# Patient Record
Sex: Female | Born: 1995 | Race: Black or African American | Hispanic: No | Marital: Single | Smoking: Never smoker
Health system: Southern US, Community
[De-identification: ages and names within clinical notes are randomized; demographics above are authoritative.]

## PROBLEM LIST (undated history)

## (undated) ENCOUNTER — Inpatient Hospital Stay (HOSPITAL_COMMUNITY): Payer: Self-pay

## (undated) ENCOUNTER — Emergency Department (HOSPITAL_COMMUNITY): Payer: Medicaid Other

## (undated) DIAGNOSIS — F32A Depression, unspecified: Secondary | ICD-10-CM

## (undated) DIAGNOSIS — F329 Major depressive disorder, single episode, unspecified: Secondary | ICD-10-CM

## (undated) DIAGNOSIS — D649 Anemia, unspecified: Secondary | ICD-10-CM

## (undated) DIAGNOSIS — A599 Trichomoniasis, unspecified: Secondary | ICD-10-CM

## (undated) DIAGNOSIS — J40 Bronchitis, not specified as acute or chronic: Secondary | ICD-10-CM

## (undated) DIAGNOSIS — N926 Irregular menstruation, unspecified: Secondary | ICD-10-CM

## (undated) DIAGNOSIS — J209 Acute bronchitis, unspecified: Secondary | ICD-10-CM

## (undated) DIAGNOSIS — T8859XA Other complications of anesthesia, initial encounter: Secondary | ICD-10-CM

## (undated) HISTORY — PX: WISDOM TOOTH EXTRACTION: SHX21

## (undated) HISTORY — DX: Other complications of anesthesia, initial encounter: T88.59XA

## (undated) HISTORY — DX: Acute bronchitis, unspecified: J20.9

---
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
## 2013-01-01 ENCOUNTER — Other Ambulatory Visit (HOSPITAL_COMMUNITY)
Admission: RE | Admit: 2013-01-01 | Discharge: 2013-01-01 | Disposition: A | Discharge status: Home / Self Care | Payer: Medicaid Other | Source: Ambulatory Visit | Attending: Obstetrics & Gynecology | Admitting: Obstetrics & Gynecology

## 2013-01-01 ENCOUNTER — Other Ambulatory Visit: Payer: Self-pay | Admitting: Obstetrics & Gynecology

## 2013-01-01 ENCOUNTER — Ambulatory Visit (INDEPENDENT_AMBULATORY_CARE_PROVIDER_SITE_OTHER): Payer: Medicaid Other | Admitting: Obstetrics & Gynecology

## 2013-01-01 ENCOUNTER — Encounter: Payer: Self-pay | Admitting: Obstetrics & Gynecology

## 2013-01-01 VITALS — BP 127/78 | Temp 96.6°F | Wt 143.4 lb

## 2013-01-01 DIAGNOSIS — Z113 Encounter for screening for infections with a predominantly sexual mode of transmission: Secondary | ICD-10-CM | POA: Insufficient documentation

## 2013-01-01 DIAGNOSIS — Z3682 Encounter for antenatal screening for nuchal translucency: Secondary | ICD-10-CM

## 2013-01-01 DIAGNOSIS — Z23 Encounter for immunization: Secondary | ICD-10-CM

## 2013-01-01 DIAGNOSIS — Z3400 Encounter for supervision of normal first pregnancy, unspecified trimester: Secondary | ICD-10-CM

## 2013-01-01 LAB — POCT URINALYSIS DIP (DEVICE)
Bilirubin Urine: NEGATIVE
Glucose, UA: NEGATIVE mg/dL
Leukocytes, UA: NEGATIVE
Nitrite: NEGATIVE
pH: 6.5 (ref 5.0–8.0)

## 2013-01-01 LAB — OB RESULTS CONSOLE GBS: GBS: POSITIVE

## 2013-01-01 MED ORDER — FERROUS SULFATE 325 (65 FE) MG PO TABS: 325.0000 mg | ORAL_TABLET | Freq: Every day | ORAL | Status: DC

## 2013-01-01 MED ORDER — PNV-IRON 29-1.13-0.4 MG PO TABS: 1.0000 | ORAL_TABLET | Freq: Every day | ORAL | Status: DC

## 2013-01-01 MED ORDER — INFLUENZA VIRUS VACC SPLIT PF IM SUSP
0.5000 mL | INTRAMUSCULAR | Status: AC
  Administered 2013-01-01: 0.5 mL via INTRAMUSCULAR

## 2013-01-01 NOTE — Progress Notes (Signed)
   Subjective:referred from ED for [facility]    Michelle Greer is a G1P0000 [redacted]w[redacted]d being seen today for her first obstetrical visit. Patient does intend to breast feed. Pregnancy history fully reviewed.  Patient reports no complaints.  Filed Vitals:   01/01/13 1011  BP: 127/78  Temp: 96.6 F (35.9 C)  Weight: 143 lb 6.4 oz (65.046 kg)    HISTORY: OB History   Grav Para Term Preterm Abortions TAB SAB Ect Mult Living   2 0 0 0 0 0 0 0 0 0      # Outc Date GA Lbr Len/2nd Wgt Sex Del Anes PTL Lv   1 CUR            2 GRA              Past Medical History  Diagnosis Date  . Bronchitis   . Bronchitis, acute    History reviewed. No pertinent past surgical history. Family History  Problem Relation Age of Onset  . Asthma Mother      Exam    Uterus:     Pelvic Exam:    Perineum: No Hemorrhoids   Vulva: normal   Vagina:  normal mucosa   pH:    Cervix: no lesions   Adnexa: normal adnexa   Bony Pelvis: average  System: Breast:  normal appearance, no masses or tenderness   Skin: normal coloration and turgor, no rashes    Neurologic: oriented, normal, normal mood   Extremities: normal strength, tone, and muscle mass   HEENT neck supple with midline trachea   Mouth/Teeth mucous membranes moist, pharynx normal without lesions and dental hygiene good   Neck supple   Cardiovascular: regular rate and rhythm   Respiratory:  appears well, vitals normal, no respiratory distress, acyanotic, normal RR, ear and throat exam is normal, neck free of mass or lymphadenopathy, chest clear, no wheezing, crepitations, rhonchi, normal symmetric air entry   Abdomen: soft, non-tender; bowel sounds normal; no masses,  no organomegaly   Urinary: urethral meatus normal      Assessment:    Pregnancy: G2P0000 Patient Active Problem List  Diagnosis  . Supervision of normal first pregnancy        Plan:     Initial labs drawn. Prenatal vitamins. Problem list reviewed and updated. Genetic  Screening discussed First Screen: requested.  Ultrasound discussed; fetal survey: requested.  Follow up in 4 weeks. 50% of 30 min visit spent on counseling and coordination of care.  Anemia-FeSO4 Rx   Michelle Greer 01/01/2013

## 2013-01-01 NOTE — Progress Notes (Signed)
Pulse- 121 Patient reports some lower abdominal/pelvic pain

## 2013-01-01 NOTE — Progress Notes (Signed)
Nutrition note: 1st visit consult Pt has gained 7.4# @ [redacted]w[redacted]d, which is > expected. Pt reports eating 4x/d.  Pt reports nausea but meds are helping. Pt is taking PNV. Pt received verbal & written education on general nutrition during pregnancy. Disc tips to decrease nausea. Disc wt gain goals of 25-35# or 1#/wk. Pt agrees to continue taking PNV. Pt does not have WIC but plans to apply. Pt plans to BF. F/u if referred Blondell Reveal, MS, RD, LDN

## 2013-01-01 NOTE — Progress Notes (Signed)
Nuchal Translucency scheduled with MFM on 01/26/13 at 930 am.

## 2013-01-02 LAB — OBSTETRIC PANEL
Basophils Absolute: 0 10*3/uL (ref 0.0–0.1)
Basophils Relative: 0 % (ref 0–1)
Hemoglobin: 10.3 g/dL — ABNORMAL LOW (ref 12.0–15.0)
Hepatitis B Surface Ag: NEGATIVE
MCHC: 30.7 g/dL (ref 30.0–36.0)
Monocytes Relative: 10 % (ref 3–12)
Neutro Abs: 6.6 10*3/uL (ref 1.7–7.7)
Neutrophils Relative %: 75 % (ref 43–77)
Rh Type: POSITIVE

## 2013-01-03 LAB — HEMOGLOBINOPATHY EVALUATION
Hemoglobin Other: 0 %
Hgb A2 Quant: 2.3 % (ref 2.2–3.2)
Hgb F Quant: 0 % (ref 0.0–2.0)
Hgb S Quant: 0 %

## 2013-01-04 LAB — CULTURE, OB URINE

## 2013-01-09 ENCOUNTER — Encounter: Payer: Self-pay | Admitting: *Deleted

## 2013-01-15 ENCOUNTER — Encounter (HOSPITAL_COMMUNITY): Payer: Self-pay | Admitting: Obstetrics & Gynecology

## 2013-01-17 ENCOUNTER — Emergency Department (HOSPITAL_COMMUNITY)
Admission: EM | Admit: 2013-01-17 | Discharge: 2013-01-18 | Disposition: A | Discharge status: Home / Self Care | Payer: Medicaid Other | Attending: Emergency Medicine | Admitting: Emergency Medicine

## 2013-01-17 ENCOUNTER — Encounter (HOSPITAL_COMMUNITY): Payer: Self-pay | Admitting: Emergency Medicine

## 2013-01-17 DIAGNOSIS — Z79899 Other long term (current) drug therapy: Secondary | ICD-10-CM | POA: Insufficient documentation

## 2013-01-17 DIAGNOSIS — O9989 Other specified diseases and conditions complicating pregnancy, childbirth and the puerperium: Secondary | ICD-10-CM | POA: Insufficient documentation

## 2013-01-17 DIAGNOSIS — N898 Other specified noninflammatory disorders of vagina: Secondary | ICD-10-CM | POA: Insufficient documentation

## 2013-01-17 DIAGNOSIS — R300 Dysuria: Secondary | ICD-10-CM | POA: Insufficient documentation

## 2013-01-17 DIAGNOSIS — Z8709 Personal history of other diseases of the respiratory system: Secondary | ICD-10-CM | POA: Insufficient documentation

## 2013-01-17 DIAGNOSIS — Z3490 Encounter for supervision of normal pregnancy, unspecified, unspecified trimester: Secondary | ICD-10-CM

## 2013-01-17 DIAGNOSIS — Z87891 Personal history of nicotine dependence: Secondary | ICD-10-CM | POA: Insufficient documentation

## 2013-01-17 NOTE — ED Notes (Signed)
Pt states she is having burning with urination  Pt states she is having lower abd pain  Pt states she has vaginal discharge that is white in color and when she washes her vaginal area it burns

## 2013-01-18 LAB — URINALYSIS, ROUTINE W REFLEX MICROSCOPIC
Bilirubin Urine: NEGATIVE
Glucose, UA: NEGATIVE mg/dL
Hgb urine dipstick: NEGATIVE
Ketones, ur: NEGATIVE mg/dL
pH: 7 (ref 5.0–8.0)

## 2013-01-18 LAB — WET PREP, GENITAL
Trich, Wet Prep: NONE SEEN
WBC, Wet Prep HPF POC: NONE SEEN
Yeast Wet Prep HPF POC: NONE SEEN

## 2013-01-18 LAB — URINE MICROSCOPIC-ADD ON

## 2013-01-18 MED ORDER — PHENAZOPYRIDINE HCL 200 MG PO TABS: 200.0000 mg | ORAL_TABLET | Freq: Three times a day (TID) | ORAL | Status: DC

## 2013-01-18 NOTE — ED Provider Notes (Signed)
History     CSN: [id number]  Arrival date & time 01/17/13  2336   First MD Initiated Contact with Patient 01/17/13 2354      Chief Complaint  Patient presents with  . Dysuria    (Consider location/radiation/quality/duration/timing/severity/associated sxs/prior treatment) HPI  Resistance to the emergency department with complaints of burning on urination and vaginal discharge. She denies having any abdominal pain or suprapubic pain. No back pain. She says that she notices a smell of her urine and that her vaginal discharge is white and green. She has been sexually active but says she uses protection. The symptoms have been persisting for 5 days. Tonight she decided that she was worried about and wanted it to get it checked out so she came to the emergency department. She has been taking her prenatal vitamins and iron supplements as prescribed. Says she is going to be out soon he needs to go get them refilled. She has an ultrasound scheduled for March 7 with the gynecologist.  Past Medical History  Diagnosis Date  . Bronchitis   . Bronchitis, acute     History reviewed. No pertinent past surgical history.  Family History  Problem Relation Age of Onset  . Asthma Mother     History  Substance Use Topics  . Smoking status: Former Games developer  . Smokeless tobacco: Never Used  . Alcohol Use: No    OB History   Grav Para Term Preterm Abortions TAB SAB Ect Mult Living   1 0 0 0 0 0 0 0 0 0       Review of Systems  Review of Systems  Gen: no weight loss, fevers, chills, night sweats  Eyes: no discharge or drainage, no occular pain or visual changes  Nose: no epistaxis or rhinorrhea  Mouth: no dental pain, no sore throat  Neck: no neck pain  Lungs:No wheezing, coughing or hemoptysis CV: no chest pain, palpitations, dependent edema or orthopnea  Abd: no abdominal pain, nausea, vomiting  GU: + dysuria and vaginal discharge. No gross hematuria  MSK:  No abnormalities  Neuro: no  headache, no focal neurologic deficits  Skin: no abnormalities Psyche: negative.   Allergies  Tomato  Home Medications   Current Outpatient Rx  Name  Route  Sig  Dispense  Refill  . Prenatal Vit-Fe Fumarate-FA (PRENATAL MULTIVITAMIN) TABS   Oral   Take 1 tablet by mouth every morning.         . ferrous sulfate 325 (65 FE) MG tablet   Oral   Take 1 tablet (325 mg total) by mouth daily with breakfast.   30 tablet   8   . phenazopyridine (PYRIDIUM) 200 MG tablet   Oral   Take 1 tablet (200 mg total) by mouth 3 (three) times daily.   6 tablet   0     BP 130/69  Pulse 97  Temp(Src) 98.6 F (37 C) (Oral)  Resp 17  Ht 5\' 5"  (1.651 m)  Wt 144 lb 12.8 oz (65.681 kg)  BMI 24.1 kg/m2  SpO2 100%  LMP 10/27/2012  Physical Exam  Nursing note and vitals reviewed. Constitutional: She appears well-developed and well-nourished. No distress.  HENT:  Head: Normocephalic and atraumatic.  Eyes: Pupils are equal, round, and reactive to light.  Neck: Normal range of motion. Neck supple.  Cardiovascular: Normal rate and regular rhythm.   Pulmonary/Chest: Effort normal.  Abdominal: Soft.  Genitourinary: Uterus is enlarged. Uterus is not tender. Cervix exhibits discharge. Cervix exhibits no motion  tenderness and no friability. No tenderness or bleeding around the vagina. No foreign body around the vagina. Vaginal discharge found.  Neurological: She is alert.  Skin: Skin is warm and dry.    ED Course  Procedures (including critical care time)  Labs Reviewed  URINALYSIS, ROUTINE W REFLEX MICROSCOPIC - Abnormal; Notable for the following:    Leukocytes, UA TRACE (*)    All other components within normal limits  WET PREP, GENITAL  GC/CHLAMYDIA PROBE AMP  URINE CULTURE  URINE MICROSCOPIC-ADD ON   No results found.   1. Dysuria   2. Pregnant       MDM  Wet prep and urine unremarkable. NO explanation for patients symptoms. She has an appointment with her ob/gyn on March  7th. Will have them reassess her dysuria at that point.   Rx; Pyridium (cat B in pregnancy)  Pt has been advised of the symptoms that warrant their return to the ED. Patient has voiced understanding and has agreed to follow-up with the PCP or specialist.         Dorthula Matas, PA 01/18/13 [phone]

## 2013-01-18 NOTE — ED Provider Notes (Signed)
Medical screening examination/treatment/procedure(s) were performed by non-physician practitioner and as supervising physician I was immediately available for consultation/collaboration.  Aleka Twitty M Karlin Binion, MD 01/18/13 0456 

## 2013-01-19 LAB — URINE CULTURE

## 2013-01-26 ENCOUNTER — Ambulatory Visit (HOSPITAL_COMMUNITY): Payer: Medicaid Other

## 2013-01-31 ENCOUNTER — Ambulatory Visit (INDEPENDENT_AMBULATORY_CARE_PROVIDER_SITE_OTHER): Payer: Medicaid Other | Admitting: Advanced Practice Midwife

## 2013-01-31 VITALS — BP 136/71 | Temp 97.8°F | Wt 142.0 lb

## 2013-01-31 DIAGNOSIS — Z3402 Encounter for supervision of normal first pregnancy, second trimester: Secondary | ICD-10-CM

## 2013-01-31 DIAGNOSIS — Z3400 Encounter for supervision of normal first pregnancy, unspecified trimester: Secondary | ICD-10-CM

## 2013-01-31 LAB — POCT URINALYSIS DIP (DEVICE)
Glucose, UA: NEGATIVE mg/dL
Ketones, ur: NEGATIVE mg/dL
Leukocytes, UA: NEGATIVE
Specific Gravity, Urine: 1.025 (ref 1.005–1.030)
Urobilinogen, UA: 0.2 mg/dL (ref 0.0–1.0)

## 2013-01-31 MED ORDER — INTEGRA F 125-1 MG PO CAPS: 1.0000 | ORAL_CAPSULE | Freq: Every day | ORAL | Status: DC

## 2013-01-31 MED ORDER — AMOXICILLIN 500 MG PO CAPS: 500.0000 mg | ORAL_CAPSULE | Freq: Three times a day (TID) | ORAL | Status: DC

## 2013-01-31 NOTE — Progress Notes (Signed)
Report slight fatigue, hgb 10.3; RX Integra; reviewed lab results, missed nuchal appt, next appt on 02/01/13.  Discussed GBS pos in urine and need for treatment in labor.  RX Amoxicillin.

## 2013-01-31 NOTE — Progress Notes (Signed)
Pulse 106 Edema trace in feet. C/o of pelvic pain. Vaginal d/c stated as white with slight odor.

## 2013-02-01 ENCOUNTER — Ambulatory Visit (HOSPITAL_COMMUNITY): Admission: RE | Admit: 2013-02-01 | Payer: Medicaid Other | Source: Ambulatory Visit

## 2013-02-01 ENCOUNTER — Ambulatory Visit (HOSPITAL_COMMUNITY): Payer: Medicaid Other

## 2013-02-02 ENCOUNTER — Ambulatory Visit (HOSPITAL_COMMUNITY): Payer: Medicaid Other

## 2013-02-02 ENCOUNTER — Ambulatory Visit (HOSPITAL_COMMUNITY)
Admission: RE | Admit: 2013-02-02 | Discharge: 2013-02-02 | Disposition: A | Discharge status: Home / Self Care | Payer: Medicaid Other | Source: Ambulatory Visit | Attending: Obstetrics & Gynecology | Admitting: Obstetrics & Gynecology

## 2013-02-02 ENCOUNTER — Other Ambulatory Visit: Payer: Self-pay | Admitting: Obstetrics & Gynecology

## 2013-02-02 DIAGNOSIS — O3510X0 Maternal care for (suspected) chromosomal abnormality in fetus, unspecified, not applicable or unspecified: Secondary | ICD-10-CM | POA: Insufficient documentation

## 2013-02-02 DIAGNOSIS — Z3682 Encounter for antenatal screening for nuchal translucency: Secondary | ICD-10-CM

## 2013-02-02 DIAGNOSIS — O351XX0 Maternal care for (suspected) chromosomal abnormality in fetus, not applicable or unspecified: Secondary | ICD-10-CM | POA: Insufficient documentation

## 2013-02-02 DIAGNOSIS — Z3689 Encounter for other specified antenatal screening: Secondary | ICD-10-CM | POA: Insufficient documentation

## 2013-02-02 NOTE — Progress Notes (Signed)
Michelle Greer  was seen today for an ultrasound appointment.  See full report in [facility].  Impression: Single IUP at 14 0/7 weeks Limited views of the anatomy obtained due to early gestaional age Unable to perform first trimester screen due gestational age  Recommendations: Recommend quad screen between 15-[redacted] weeks gestation Ultrasound for fetal anatomy at approximately [redacted] weeks gestation  Alpha Gula, MD

## 2013-02-16 ENCOUNTER — Emergency Department (HOSPITAL_COMMUNITY)
Admission: EM | Admit: 2013-02-16 | Discharge: 2013-02-16 | Disposition: A | Discharge status: Home / Self Care | Payer: Medicaid Other | Attending: Emergency Medicine | Admitting: Emergency Medicine

## 2013-02-16 ENCOUNTER — Encounter (HOSPITAL_COMMUNITY): Payer: Self-pay | Admitting: Emergency Medicine

## 2013-02-16 DIAGNOSIS — Z8709 Personal history of other diseases of the respiratory system: Secondary | ICD-10-CM | POA: Insufficient documentation

## 2013-02-16 DIAGNOSIS — R109 Unspecified abdominal pain: Secondary | ICD-10-CM | POA: Insufficient documentation

## 2013-02-16 DIAGNOSIS — Z87891 Personal history of nicotine dependence: Secondary | ICD-10-CM | POA: Insufficient documentation

## 2013-02-16 DIAGNOSIS — O219 Vomiting of pregnancy, unspecified: Secondary | ICD-10-CM | POA: Insufficient documentation

## 2013-02-16 DIAGNOSIS — Z3201 Encounter for pregnancy test, result positive: Secondary | ICD-10-CM | POA: Insufficient documentation

## 2013-02-16 LAB — URINE MICROSCOPIC-ADD ON

## 2013-02-16 LAB — COMPREHENSIVE METABOLIC PANEL
ALT: 7 U/L (ref 0–35)
AST: 12 U/L (ref 0–37)
Albumin: 3 g/dL — ABNORMAL LOW (ref 3.5–5.2)
Alkaline Phosphatase: 45 U/L (ref 39–117)
BUN: 8 mg/dL (ref 6–23)
CO2: 23 mEq/L (ref 19–32)
Calcium: 8.6 mg/dL (ref 8.4–10.5)
Chloride: 100 mEq/L (ref 96–112)
Creatinine, Ser: 0.69 mg/dL (ref 0.50–1.10)
GFR calc Af Amer: >90 mL/min (ref >90)
GFR calc non Af Amer: >90 mL/min (ref >90)
Glucose, Bld: 93 mg/dL (ref 70–99)
Potassium: 3.8 mEq/L (ref 3.5–5.1)
Sodium: 133 mEq/L — ABNORMAL LOW (ref 135–145)
Total Bilirubin: 0.2 mg/dL — ABNORMAL LOW (ref 0.3–1.2)
Total Protein: 7.2 g/dL (ref 6.0–8.3)

## 2013-02-16 LAB — URINALYSIS, ROUTINE W REFLEX MICROSCOPIC
Hgb urine dipstick: NEGATIVE
Nitrite: NEGATIVE
Specific Gravity, Urine: 1.029 (ref 1.005–1.030)
Urobilinogen, UA: 0.2 mg/dL (ref 0.0–1.0)

## 2013-02-16 LAB — CBC WITH DIFFERENTIAL/PLATELET
Basophils Relative: 0 % (ref 0–1)
Eosinophils Absolute: 0 10*3/uL (ref 0.0–0.7)
MCH: 25.8 pg — ABNORMAL LOW (ref 26.0–34.0)
MCHC: 33 g/dL (ref 30.0–36.0)
Monocytes Absolute: 0.2 10*3/uL (ref 0.1–1.0)
Neutrophils Relative %: 92 % — ABNORMAL HIGH (ref 43–77)
Platelets: 347 10*3/uL (ref 150–400)

## 2013-02-16 LAB — PREGNANCY, URINE: Preg Test, Ur: POSITIVE — AB

## 2013-02-16 LAB — LIPASE, BLOOD: Lipase: 33 U/L (ref 11–59)

## 2013-02-16 MED ORDER — SODIUM CHLORIDE 0.9 % IV BOLUS (SEPSIS)
1000.0000 mL | Freq: Once | INTRAVENOUS | Status: AC
Start: 2013-02-16 — End: 2013-02-16
  Administered 2013-02-16: 1000 mL via INTRAVENOUS

## 2013-02-16 MED ORDER — ONDANSETRON HCL 4 MG PO TABS: 4.0000 mg | ORAL_TABLET | Freq: Four times a day (QID) | ORAL | Status: DC

## 2013-02-16 MED ORDER — ONDANSETRON HCL 4 MG/2ML IJ SOLN
4.0000 mg | Freq: Once | INTRAMUSCULAR | Status: AC
  Administered 2013-02-16: 4 mg via INTRAVENOUS
  Filled 2013-02-16: qty 2

## 2013-02-16 NOTE — ED Notes (Addendum)
Patient with nausea, vomiting, abdominal pain especially on left lower side radiating over to the right.  Patient reports symptoms started around 10pm last night and patient reports feeling dizzy today.  Pt reports vomiting x eight.  Pt rates pain as a 8/10.  Patient is sixteen weeks pregnant.

## 2013-02-16 NOTE — ED Provider Notes (Signed)
History     CSN: [id number]  Arrival date & time 02/16/13  1811   First MD Initiated Contact with Patient 02/16/13 1819      Chief Complaint  Patient presents with  . Emesis    (Consider location/radiation/quality/duration/timing/severity/associated sxs/prior treatment) HPI Comments: Comes to the ER for evaluation of nausea and vomiting. Patient reports that symptoms began last night. She has had diffuse abdominal cramping which has been moderate. Chest no fever. Of breath. Symptoms. Patient reports that her mother had a GI illness 2 days ago including diarrhea. Patient has not had any diarrhea today.  Patient is a 17 y.o. female presenting with vomiting.  Emesis Associated symptoms: abdominal pain   Associated symptoms: no diarrhea     Past Medical History  Diagnosis Date  . Bronchitis   . Bronchitis, acute     No past surgical history on file.  Family History  Problem Relation Age of Onset  . Asthma Mother     History  Substance Use Topics  . Smoking status: Former Games developer  . Smokeless tobacco: Never Used  . Alcohol Use: No    OB History   Grav Para Term Preterm Abortions TAB SAB Ect Mult Living   1 0 0 0 0 0 0 0 0 0       Review of Systems  Gastrointestinal: Positive for nausea, vomiting and abdominal pain. Negative for diarrhea.  All other systems reviewed and are negative.    Allergies  Tomato  Home Medications   Current Outpatient Rx  Name  Route  Sig  Dispense  Refill  . amoxicillin (AMOXIL) 500 MG capsule   Oral   Take 1 capsule (500 mg total) by mouth 3 (three) times daily.   21 capsule   0   . Fe Fum-FePoly-FA-Vit C-Vit B3 (INTEGRA F) 125-1 MG CAPS   Oral   Take 1 tablet by mouth daily.   30 capsule   1   . ferrous sulfate 325 (65 FE) MG tablet   Oral   Take 1 tablet (325 mg total) by mouth daily with breakfast.   30 tablet   8   . phenazopyridine (PYRIDIUM) 200 MG tablet   Oral   Take 1 tablet (200 mg total) by mouth 3  (three) times daily.   6 tablet   0   . Prenatal Vit-Fe Fumarate-FA (PRENATAL MULTIVITAMIN) TABS   Oral   Take 1 tablet by mouth every morning.           LMP 10/27/2012  Physical Exam  Constitutional: She is oriented to person, place, and time. She appears well-developed and well-nourished. No distress.  HENT:  Head: Normocephalic and atraumatic.  Right Ear: Hearing normal.  Nose: Nose normal.  Mouth/Throat: Oropharynx is clear and moist and mucous membranes are normal.  Eyes: Conjunctivae and EOM are normal. Pupils are equal, round, and reactive to light.  Neck: Normal range of motion. Neck supple.  Cardiovascular: Normal rate, regular rhythm, S1 normal and S2 normal.  Exam reveals no gallop and no friction rub.   No murmur heard. Pulmonary/Chest: Effort normal and breath sounds normal. No respiratory distress. She exhibits no tenderness.  Abdominal: Soft. Normal appearance and bowel sounds are normal. There is no hepatosplenomegaly. There is no tenderness. There is no rebound, no guarding, no tenderness at McBurney's point and negative Murphy's sign. No hernia.  Musculoskeletal: Normal range of motion.  Neurological: She is alert and oriented to person, place, and time. She has normal strength. No  cranial nerve deficit or sensory deficit. Coordination normal. GCS eye subscore is 4. GCS verbal subscore is 5. GCS motor subscore is 6.  Skin: Skin is warm, dry and intact. No rash noted. No cyanosis.  Psychiatric: She has a normal mood and affect. Her speech is normal and behavior is normal. Thought content normal.    ED Course  Procedures (including critical care time)  Labs Reviewed  CBC WITH DIFFERENTIAL - Abnormal; Notable for the following:    Hemoglobin 11.7 (*)    HCT 35.5 (*)    MCH 25.8 (*)    RDW 24.0 (*)    Neutrophils Relative 92 (*)    Lymphocytes Relative 6 (*)    Monocytes Relative 2 (*)    Lymphs Abs 0.5 (*)    All other components within normal limits   COMPREHENSIVE METABOLIC PANEL - Abnormal; Notable for the following:    Sodium 133 (*)    Albumin 3.0 (*)    Total Bilirubin 0.2 (*)    All other components within normal limits  URINALYSIS, ROUTINE W REFLEX MICROSCOPIC - Abnormal; Notable for the following:    Leukocytes, UA TRACE (*)    All other components within normal limits  PREGNANCY, URINE - Abnormal; Notable for the following:    Preg Test, Ur POSITIVE (*)    All other components within normal limits  URINE MICROSCOPIC-ADD ON - Abnormal; Notable for the following:    Squamous Epithelial / LPF MANY (*)    Bacteria, UA FEW (*)    All other components within normal limits  LIPASE, BLOOD   No results found.   Diagnosis: Vomiting in pregnancy    MDM  Patient comes to the ER with complaints of nausea and vomiting. Patient does have a sick contact at home with GI symptoms. She is in her second trimester of pregnancy. Patient was admitted for IV fluids and Zofran. Her lab work is unremarkable. Patient is not experiencing any vaginal bleeding. She has a confirmed intrauterine pregnancy. Patient will be discharged, continue oral hydration and medications for nausea and vomiting.        Gilda Crease, MD 02/16/13 2026

## 2013-02-28 ENCOUNTER — Ambulatory Visit (INDEPENDENT_AMBULATORY_CARE_PROVIDER_SITE_OTHER): Payer: Medicaid Other | Admitting: Family Medicine

## 2013-02-28 ENCOUNTER — Other Ambulatory Visit: Payer: Self-pay | Admitting: Family Medicine

## 2013-02-28 VITALS — BP 123/81 | Temp 97.2°F | Wt 144.6 lb

## 2013-02-28 DIAGNOSIS — Z3402 Encounter for supervision of normal first pregnancy, second trimester: Secondary | ICD-10-CM

## 2013-02-28 DIAGNOSIS — Z3400 Encounter for supervision of normal first pregnancy, unspecified trimester: Secondary | ICD-10-CM

## 2013-02-28 LAB — POCT URINALYSIS DIP (DEVICE)
Protein, ur: NEGATIVE mg/dL
Specific Gravity, Urine: 1.01 (ref 1.005–1.030)
Urobilinogen, UA: 0.2 mg/dL (ref 0.0–1.0)
pH: 7 (ref 5.0–8.0)

## 2013-02-28 MED ORDER — RANITIDINE HCL 150 MG PO TABS: 75.0000 mg | ORAL_TABLET | Freq: Two times a day (BID) | ORAL | Status: DC

## 2013-02-28 NOTE — Patient Instructions (Addendum)
AFP Maternal This is a routine screen (tests) used to check for fetal abnormalities such as Down syndrome and neural tube defects. Down Syndrome is a chromosomal abnormality, sometimes called Trisomy 26. Neural tube defects are serious birth defects. The brain, spinal cord, or their coverings do not develop completely. Women should be tested in the 15th to 20th week of pregnancy. The msAFP screen involves three or four tests that measure substances found in the blood that make the testing better. During development, AFP levels in fetal blood and amniotic fluid rise until about 12 weeks. The levels then gradually fall until birth. AFP is a protein produce by fetal tissue. AFP crosses the placenta and appears in the maternal blood. A baby with an open neural tube defect has an opening in its spine, head, or abdominal wall that allows higher-than-usual amounts of AFP to pass into the mother's blood. If a screen is positive, more tests are needed to make a diagnosis. These include ultrasound and perhaps amniocentesis (checking the fluid that surrounds the baby). These tests are used to help women and their caregivers make decisions about the management of their pregnancies. In pregnancies where the fetus is carrying the chromosomal defect that results in Down syndrome, the levels of AFP and unconjugated estriol tend to be low and hCG and inhibin A levels high.  PREPARATION FOR TEST Blood is drawn from a vein in your arm usually between the 15th and 20th weeks of pregnancy. Four different tests on your blood are done. These are AFP, hCG, unconjugated estriol, and inhibin A. The combination of tests produces a more accurate result. NORMAL FINDINGS   Adult: less than 40ng/mL or less than 40 mg/L (SI units)  Child younger than1 year: less than 30 ng/mL Ranges are stratified by weeks of gestation and vary among laboratories. Ranges for normal findings may vary among different laboratories and hospitals. You  should always check with your doctor after having lab work or other tests done to discuss the meaning of your test results and whether your values are considered within normal limits. MEANING OF TEST  These are screening tests. Not all fetal abnormalities will give positive test results. Of all women who have positive AFP screening results, only a very small number of them have babies who actually have a neural tube defect or chromosomal abnormality. Your caregiver will go over the test results with you and discuss the importance and meaning of your results, as well as treatment options and the need for additional tests if necessary. OBTAINING THE TEST RESULTS It is your responsibility to obtain your test results. Ask the lab or department performing the test when and how you will get your results. Document Released: 11/30/2004 Document Revised: 01/31/2012 Document Reviewed: 10/12/2008 [facility] Patient Information 2013 [place], [place].   Pregnancy - Second Trimester The second trimester of pregnancy (3 to 6 months) is a period of rapid growth for you and your baby. At the end of the sixth month, your baby is about 9 inches long and weighs 1 1/2 pounds. You will begin to feel the baby move between 18 and 20 weeks of the pregnancy. This is called quickening. Weight gain is faster. A clear fluid (colostrum) may leak out of your breasts. You may feel small contractions of the womb (uterus). This is known as false labor or Braxton-Hicks contractions. This is like a practice for labor when the baby is ready to be born. Usually, the problems with morning sickness have usually passed by the  end of your first trimester. Some women develop small dark blotches (called cholasma, mask of pregnancy) on their face that usually goes away after the baby is born. Exposure to the sun makes the blotches worse. Acne may also develop in some pregnant women and pregnant women who have acne, may find that it goes away. PRENATAL  EXAMS  Blood work may continue to be done during prenatal exams. These tests are done to check on your health and the probable health of your baby. Blood work is used to follow your blood levels (hemoglobin). Anemia (low hemoglobin) is common during pregnancy. Iron and vitamins are given to help prevent this. You will also be checked for diabetes between 24 and 28 weeks of the pregnancy. Some of the previous blood tests may be repeated.  The size of the uterus is measured during each visit. This is to make sure that the baby is continuing to grow properly according to the dates of the pregnancy.  Your blood pressure is checked every prenatal visit. This is to make sure you are not getting toxemia.  Your urine is checked to make sure you do not have an infection, diabetes or protein in the urine.  Your weight is checked often to make sure gains are happening at the suggested rate. This is to ensure that both you and your baby are growing normally.  Sometimes, an ultrasound is performed to confirm the proper growth and development of the baby. This is a test which bounces harmless sound waves off the baby so your caregiver can more accurately determine due dates. Sometimes, a specialized test is done on the amniotic fluid surrounding the baby. This test is called an amniocentesis. The amniotic fluid is obtained by sticking a needle into the belly (abdomen). This is done to check the chromosomes in instances where there is a concern about possible genetic problems with the baby. It is also sometimes done near the end of pregnancy if an early delivery is required. In this case, it is done to help make sure the baby's lungs are mature enough for the baby to live outside of the womb. CHANGES OCCURING IN THE SECOND TRIMESTER OF PREGNANCY Your body goes through many changes during pregnancy. They vary from person to person. Talk to your caregiver about changes you notice that you are concerned  about.  During the second trimester, you will likely have an increase in your appetite. It is normal to have cravings for certain foods. This varies from person to person and pregnancy to pregnancy.  Your lower abdomen will begin to bulge.  You may have to urinate more often because the uterus and baby are pressing on your bladder. It is also common to get more bladder infections during pregnancy (pain with urination). You can help this by drinking lots of fluids and emptying your bladder before and after intercourse.  You may begin to get stretch marks on your hips, abdomen, and breasts. These are normal changes in the body during pregnancy. There are no exercises or medications to take that prevent this change.  You may begin to develop swollen and bulging veins (varicose veins) in your legs. Wearing support hose, elevating your feet for 15 minutes, 3 to 4 times a day and limiting salt in your diet helps lessen the problem.  Heartburn may develop as the uterus grows and pushes up against the stomach. Antacids recommended by your caregiver helps with this problem. Also, eating smaller meals 4 to 5 times a day  helps.  Constipation can be treated with a stool softener or adding bulk to your diet. Drinking lots of fluids, vegetables, fruits, and whole grains are helpful.  Exercising is also helpful. If you have been very active up until your pregnancy, most of these activities can be continued during your pregnancy. If you have been less active, it is helpful to start an exercise program such as walking.  Hemorrhoids (varicose veins in the rectum) may develop at the end of the second trimester. Warm sitz baths and hemorrhoid cream recommended by your caregiver helps hemorrhoid problems.  Backaches may develop during this time of your pregnancy. Avoid heavy lifting, wear low heal shoes and practice good posture to help with backache problems.  Some pregnant women develop tingling and numbness of  their hand and fingers because of swelling and tightening of ligaments in the wrist (carpel tunnel syndrome). This goes away after the baby is born.  As your breasts enlarge, you may have to get a bigger bra. Get a comfortable, cotton, support bra. Do not get a nursing bra until the last month of the pregnancy if you will be nursing the baby.  You may get a dark line from your belly button to the pubic area called the linea nigra.  You may develop rosy cheeks because of increase blood flow to the face.  You may develop spider looking lines of the face, neck, arms and chest. These go away after the baby is born. HOME CARE INSTRUCTIONS   It is extremely important to avoid all smoking, herbs, alcohol, and unprescribed drugs during your pregnancy. These chemicals affect the formation and growth of the baby. Avoid these chemicals throughout the pregnancy to ensure the delivery of a healthy infant.  Most of your home care instructions are the same as suggested for the first trimester of your pregnancy. Keep your caregiver's appointments. Follow your caregiver's instructions regarding medication use, exercise and diet.  During pregnancy, you are providing food for you and your baby. Continue to eat regular, well-balanced meals. Choose foods such as meat, fish, milk and other low fat dairy products, vegetables, fruits, and whole-grain breads and cereals. Your caregiver will tell you of the ideal weight gain.  A physical sexual relationship may be continued up until near the end of pregnancy if there are no other problems. Problems could include early (premature) leaking of amniotic fluid from the membranes, vaginal bleeding, abdominal pain, or other medical or pregnancy problems.  Exercise regularly if there are no restrictions. Check with your caregiver if you are unsure of the safety of some of your exercises. The greatest weight gain will occur in the last 2 trimesters of pregnancy. Exercise will help  you:  Control your weight.  Get you in shape for labor and delivery.  Lose weight after you have the baby.  Wear a good support or jogging bra for breast tenderness during pregnancy. This may help if worn during sleep. Pads or tissues may be used in the bra if you are leaking colostrum.  Do not use hot tubs, steam rooms or saunas throughout the pregnancy.  Wear your seat belt at all times when driving. This protects you and your baby if you are in an accident.  Avoid raw meat, uncooked cheese, cat litter boxes and soil used by cats. These carry germs that can cause birth defects in the baby.  The second trimester is also a good time to visit your dentist for your dental health if this has not  been done yet. Getting your teeth cleaned is OK. Use a soft toothbrush. Brush gently during pregnancy.  It is easier to loose urine during pregnancy. Tightening up and strengthening the pelvic muscles will help with this problem. Practice stopping your urination while you are going to the bathroom. These are the same muscles you need to strengthen. It is also the muscles you would use as if you were trying to stop from passing gas. You can practice tightening these muscles up 10 times a set and repeating this about 3 times per day. Once you know what muscles to tighten up, do not perform these exercises during urination. It is more likely to contribute to an infection by backing up the urine.  Ask for help if you have financial, counseling or nutritional needs during pregnancy. Your caregiver will be able to offer counseling for these needs as well as refer you for other special needs.  Your skin may become oily. If so, wash your face with mild soap, use non-greasy moisturizer and oil or cream based makeup. MEDICATIONS AND DRUG USE IN PREGNANCY  Take prenatal vitamins as directed. The vitamin should contain 1 milligram of folic acid. Keep all vitamins out of reach of children. Only a couple vitamins or  tablets containing iron may be fatal to a baby or young child when ingested.  Avoid use of all medications, including herbs, over-the-counter medications, not prescribed or suggested by your caregiver. Only take over-the-counter or prescription medicines for pain, discomfort, or fever as directed by your caregiver. Do not use aspirin.  Let your caregiver also know about herbs you may be using.  Alcohol is related to a number of birth defects. This includes fetal alcohol syndrome. All alcohol, in any form, should be avoided completely. Smoking will cause low birth rate and premature babies.  Street or illegal drugs are very harmful to the baby. They are absolutely forbidden. A baby born to an addicted mother will be addicted at birth. The baby will go through the same withdrawal an adult does. SEEK MEDICAL CARE IF:  You have any concerns or worries during your pregnancy. It is better to call with your questions if you feel they cannot wait, rather than worry about them. SEEK IMMEDIATE MEDICAL CARE IF:   An unexplained oral temperature above 102 F (38.9 C) develops, or as your caregiver suggests.  You have leaking of fluid from the vagina (birth canal). If leaking membranes are suspected, take your temperature and tell your caregiver of this when you call.  There is vaginal spotting, bleeding, or passing clots. Tell your caregiver of the amount and how many pads are used. Light spotting in pregnancy is common, especially following intercourse.  You develop a bad smelling vaginal discharge with a change in the color from clear to white.  You continue to feel sick to your stomach (nauseated) and have no relief from remedies suggested. You vomit blood or coffee ground-like materials.  You lose more than 2 pounds of weight or gain more than 2 pounds of weight over 1 week, or as suggested by your caregiver.  You notice swelling of your face, hands, feet, or legs.  You get exposed to [place]  measles and have never had them.  You are exposed to fifth disease or chickenpox.  You develop belly (abdominal) pain. Round ligament discomfort is a common non-cancerous (benign) cause of abdominal pain in pregnancy. Your caregiver still must evaluate you.  You develop a bad headache that does not go  away.  You develop fever, diarrhea, pain with urination, or shortness of breath.  You develop visual problems, blurry, or double vision.  You fall or are in a car accident or any kind of trauma.  There is mental or physical violence at home. Document Released: 11/02/2001 Document Revised: 01/31/2012 Document Reviewed: 05/07/2009 [facility] Patient Information 2013 [place], [place].

## 2013-03-07 ENCOUNTER — Encounter: Payer: Self-pay | Admitting: *Deleted

## 2013-03-07 ENCOUNTER — Ambulatory Visit (HOSPITAL_COMMUNITY)
Admission: RE | Admit: 2013-03-07 | Discharge: 2013-03-07 | Disposition: A | Discharge status: Home / Self Care | Payer: Medicaid Other | Source: Ambulatory Visit | Attending: Family Medicine | Admitting: Family Medicine

## 2013-03-07 DIAGNOSIS — Z3689 Encounter for other specified antenatal screening: Secondary | ICD-10-CM | POA: Insufficient documentation

## 2013-03-07 DIAGNOSIS — Z3402 Encounter for supervision of normal first pregnancy, second trimester: Secondary | ICD-10-CM

## 2013-03-09 ENCOUNTER — Encounter: Payer: Self-pay | Admitting: Family Medicine

## 2013-03-11 ENCOUNTER — Encounter: Payer: Self-pay | Admitting: Family Medicine

## 2013-03-28 ENCOUNTER — Ambulatory Visit (INDEPENDENT_AMBULATORY_CARE_PROVIDER_SITE_OTHER): Payer: Medicaid Other | Admitting: Advanced Practice Midwife

## 2013-03-28 VITALS — BP 121/76 | Temp 97.0°F | Wt 144.1 lb

## 2013-03-28 DIAGNOSIS — Z3402 Encounter for supervision of normal first pregnancy, second trimester: Secondary | ICD-10-CM

## 2013-03-28 DIAGNOSIS — Z3480 Encounter for supervision of other normal pregnancy, unspecified trimester: Secondary | ICD-10-CM

## 2013-03-28 DIAGNOSIS — Z3400 Encounter for supervision of normal first pregnancy, unspecified trimester: Secondary | ICD-10-CM

## 2013-03-28 DIAGNOSIS — Z3492 Encounter for supervision of normal pregnancy, unspecified, second trimester: Secondary | ICD-10-CM

## 2013-03-28 LAB — POCT URINALYSIS DIP (DEVICE)
Nitrite: NEGATIVE
Specific Gravity, Urine: >=1.03 (ref 1.005–1.030)
Urobilinogen, UA: 0.2 mg/dL (ref 0.0–1.0)
pH: 7 (ref 5.0–8.0)

## 2013-03-28 MED ORDER — DOCUSATE SODIUM 100 MG PO CAPS: 100.0000 mg | ORAL_CAPSULE | Freq: Two times a day (BID) | ORAL | Status: DC

## 2013-03-28 MED ORDER — FERROUS SULFATE 325 (65 FE) MG PO TABS: 325.0000 mg | ORAL_TABLET | Freq: Every day | ORAL | Status: DC

## 2013-03-28 MED ORDER — FLUCONAZOLE 150 MG PO TABS: 150.0000 mg | ORAL_TABLET | Freq: Once | ORAL | Status: DC

## 2013-03-28 MED ORDER — INTEGRA F 125-1 MG PO CAPS: 1.0000 | ORAL_CAPSULE | Freq: Every day | ORAL | Status: DC

## 2013-03-28 NOTE — Patient Instructions (Signed)
Pregnancy - Second Trimester The second trimester of pregnancy (3 to 6 months) is a period of rapid growth for you and your baby. At the end of the sixth month, your baby is about 9 inches long and weighs 1 1/2 pounds. You will begin to feel the baby move between 18 and 20 weeks of the pregnancy. This is called quickening. Weight gain is faster. A clear fluid (colostrum) may leak out of your breasts. You may feel small contractions of the womb (uterus). This is known as false labor or Braxton-Hicks contractions. This is like a practice for labor when the baby is ready to be born. Usually, the problems with morning sickness have usually passed by the end of your first trimester. Some women develop small dark blotches (called cholasma, mask of pregnancy) on their face that usually goes away after the baby is born. Exposure to the sun makes the blotches worse. Acne may also develop in some pregnant women and pregnant women who have acne, may find that it goes away. PRENATAL EXAMS  Blood work may continue to be done during prenatal exams. These tests are done to check on your health and the probable health of your baby. Blood work is used to follow your blood levels (hemoglobin). Anemia (low hemoglobin) is common during pregnancy. Iron and vitamins are given to help prevent this. You will also be checked for diabetes between 24 and 28 weeks of the pregnancy. Some of the previous blood tests may be repeated.  The size of the uterus is measured during each visit. This is to make sure that the baby is continuing to grow properly according to the dates of the pregnancy.  Your blood pressure is checked every prenatal visit. This is to make sure you are not getting toxemia.  Your urine is checked to make sure you do not have an infection, diabetes or protein in the urine.  Your weight is checked often to make sure gains are happening at the suggested rate. This is to ensure that both you and your baby are growing  normally.  Sometimes, an ultrasound is performed to confirm the proper growth and development of the baby. This is a test which bounces harmless sound waves off the baby so your caregiver can more accurately determine due dates. Sometimes, a specialized test is done on the amniotic fluid surrounding the baby. This test is called an amniocentesis. The amniotic fluid is obtained by sticking a needle into the belly (abdomen). This is done to check the chromosomes in instances where there is a concern about possible genetic problems with the baby. It is also sometimes done near the end of pregnancy if an early delivery is required. In this case, it is done to help make sure the baby's lungs are mature enough for the baby to live outside of the womb. CHANGES OCCURING IN THE SECOND TRIMESTER OF PREGNANCY Your body goes through many changes during pregnancy. They vary from person to person. Talk to your caregiver about changes you notice that you are concerned about.  During the second trimester, you will likely have an increase in your appetite. It is normal to have cravings for certain foods. This varies from person to person and pregnancy to pregnancy.  Your lower abdomen will begin to bulge.  You may have to urinate more often because the uterus and baby are pressing on your bladder. It is also common to get more bladder infections during pregnancy (pain with urination). You can help this by   drinking lots of fluids and emptying your bladder before and after intercourse.  You may begin to get stretch marks on your hips, abdomen, and breasts. These are normal changes in the body during pregnancy. There are no exercises or medications to take that prevent this change.  You may begin to develop swollen and bulging veins (varicose veins) in your legs. Wearing support hose, elevating your feet for 15 minutes, 3 to 4 times a day and limiting salt in your diet helps lessen the problem.  Heartburn may develop  as the uterus grows and pushes up against the stomach. Antacids recommended by your caregiver helps with this problem. Also, eating smaller meals 4 to 5 times a day helps.  Constipation can be treated with a stool softener or adding bulk to your diet. Drinking lots of fluids, vegetables, fruits, and whole grains are helpful.  Exercising is also helpful. If you have been very active up until your pregnancy, most of these activities can be continued during your pregnancy. If you have been less active, it is helpful to start an exercise program such as walking.  Hemorrhoids (varicose veins in the rectum) may develop at the end of the second trimester. Warm sitz baths and hemorrhoid cream recommended by your caregiver helps hemorrhoid problems.  Backaches may develop during this time of your pregnancy. Avoid heavy lifting, wear low heal shoes and practice good posture to help with backache problems.  Some pregnant women develop tingling and numbness of their hand and fingers because of swelling and tightening of ligaments in the wrist (carpel tunnel syndrome). This goes away after the baby is born.  As your breasts enlarge, you may have to get a bigger bra. Get a comfortable, cotton, support bra. Do not get a nursing bra until the last month of the pregnancy if you will be nursing the baby.  You may get a dark line from your belly button to the pubic area called the linea nigra.  You may develop rosy cheeks because of increase blood flow to the face.  You may develop spider looking lines of the face, neck, arms and chest. These go away after the baby is born. HOME CARE INSTRUCTIONS   It is extremely important to avoid all smoking, herbs, alcohol, and unprescribed drugs during your pregnancy. These chemicals affect the formation and growth of the baby. Avoid these chemicals throughout the pregnancy to ensure the delivery of a healthy infant.  Most of your home care instructions are the same as  suggested for the first trimester of your pregnancy. Keep your caregiver's appointments. Follow your caregiver's instructions regarding medication use, exercise and diet.  During pregnancy, you are providing food for you and your baby. Continue to eat regular, well-balanced meals. Choose foods such as meat, fish, milk and other low fat dairy products, vegetables, fruits, and whole-grain breads and cereals. Your caregiver will tell you of the ideal weight gain.  A physical sexual relationship may be continued up until near the end of pregnancy if there are no other problems. Problems could include early (premature) leaking of amniotic fluid from the membranes, vaginal bleeding, abdominal pain, or other medical or pregnancy problems.  Exercise regularly if there are no restrictions. Check with your caregiver if you are unsure of the safety of some of your exercises. The greatest weight gain will occur in the last 2 trimesters of pregnancy. Exercise will help you:  Control your weight.  Get you in shape for labor and delivery.  Lose weight   after you have the baby.  Wear a good support or jogging bra for breast tenderness during pregnancy. This may help if worn during sleep. Pads or tissues may be used in the bra if you are leaking colostrum.  Do not use hot tubs, steam rooms or saunas throughout the pregnancy.  Wear your seat belt at all times when driving. This protects you and your baby if you are in an accident.  Avoid raw meat, uncooked cheese, cat litter boxes and soil used by cats. These carry germs that can cause birth defects in the baby.  The second trimester is also a good time to visit your dentist for your dental health if this has not been done yet. Getting your teeth cleaned is OK. Use a soft toothbrush. Brush gently during pregnancy.  It is easier to loose urine during pregnancy. Tightening up and strengthening the pelvic muscles will help with this problem. Practice stopping your  urination while you are going to the bathroom. These are the same muscles you need to strengthen. It is also the muscles you would use as if you were trying to stop from passing gas. You can practice tightening these muscles up 10 times a set and repeating this about 3 times per day. Once you know what muscles to tighten up, do not perform these exercises during urination. It is more likely to contribute to an infection by backing up the urine.  Ask for help if you have financial, counseling or nutritional needs during pregnancy. Your caregiver will be able to offer counseling for these needs as well as refer you for other special needs.  Your skin may become oily. If so, wash your face with mild soap, use non-greasy moisturizer and oil or cream based makeup. MEDICATIONS AND DRUG USE IN PREGNANCY  Take prenatal vitamins as directed. The vitamin should contain 1 milligram of folic acid. Keep all vitamins out of reach of children. Only a couple vitamins or tablets containing iron may be fatal to a baby or young child when ingested.  Avoid use of all medications, including herbs, over-the-counter medications, not prescribed or suggested by your caregiver. Only take over-the-counter or prescription medicines for pain, discomfort, or fever as directed by your caregiver. Do not use aspirin.  Let your caregiver also know about herbs you may be using.  Alcohol is related to a number of birth defects. This includes fetal alcohol syndrome. All alcohol, in any form, should be avoided completely. Smoking will cause low birth rate and premature babies.  Street or illegal drugs are very harmful to the baby. They are absolutely forbidden. A baby born to an addicted mother will be addicted at birth. The baby will go through the same withdrawal an adult does. SEEK MEDICAL CARE IF:  You have any concerns or worries during your pregnancy. It is better to call with your questions if you feel they cannot wait, rather  than worry about them. SEEK IMMEDIATE MEDICAL CARE IF:   An unexplained oral temperature above 102 F (38.9 C) develops, or as your caregiver suggests.  You have leaking of fluid from the vagina (birth canal). If leaking membranes are suspected, take your temperature and tell your caregiver of this when you call.  There is vaginal spotting, bleeding, or passing clots. Tell your caregiver of the amount and how many pads are used. Light spotting in pregnancy is common, especially following intercourse.  You develop a bad smelling vaginal discharge with a change in the color from clear   to white.  You continue to feel sick to your stomach (nauseated) and have no relief from remedies suggested. You vomit blood or coffee ground-like materials.  You lose more than 2 pounds of weight or gain more than 2 pounds of weight over 1 week, or as suggested by your caregiver.  You notice swelling of your face, hands, feet, or legs.  You get exposed to German measles and have never had them.  You are exposed to fifth disease or chickenpox.  You develop belly (abdominal) pain. Round ligament discomfort is a common non-cancerous (benign) cause of abdominal pain in pregnancy. Your caregiver still must evaluate you.  You develop a bad headache that does not go away.  You develop fever, diarrhea, pain with urination, or shortness of breath.  You develop visual problems, blurry, or double vision.  You fall or are in a car accident or any kind of trauma.  There is mental or physical violence at home. Document Released: 11/02/2001 Document Revised: 01/31/2012 Document Reviewed: 05/07/2009 Exit[employer] Patient Information 2013 [employer].  

## 2013-03-28 NOTE — Progress Notes (Signed)
Pulse-96 Patient reports pelvic pressure & white d/c with irritation & itching; needs refill on PNV

## 2013-03-28 NOTE — Progress Notes (Signed)
Discharge, itching, pressure - wet prep and gc done today. Yeast infection by exam - rx diflucan. Refill sent on PNV and iron. Also c/o constipation - colace rx sent. Rev'd precautions.

## 2013-03-29 ENCOUNTER — Encounter: Payer: Self-pay | Admitting: Advanced Practice Midwife

## 2013-03-29 LAB — GC/CHLAMYDIA PROBE AMP
CT Probe RNA: NEGATIVE
GC Probe RNA: NEGATIVE

## 2013-04-25 ENCOUNTER — Encounter: Payer: Medicaid Other | Admitting: Advanced Practice Midwife

## 2013-05-02 ENCOUNTER — Encounter: Payer: Self-pay | Admitting: Family

## 2013-05-02 ENCOUNTER — Ambulatory Visit (INDEPENDENT_AMBULATORY_CARE_PROVIDER_SITE_OTHER): Payer: Medicaid Other | Admitting: Family

## 2013-05-02 VITALS — BP 122/86 | Wt 151.9 lb

## 2013-05-02 DIAGNOSIS — Z3400 Encounter for supervision of normal first pregnancy, unspecified trimester: Secondary | ICD-10-CM

## 2013-05-02 LAB — POCT URINALYSIS DIP (DEVICE)
Glucose, UA: NEGATIVE mg/dL
Specific Gravity, Urine: 1.02 (ref 1.005–1.030)
Urobilinogen, UA: 1 mg/dL (ref 0.0–1.0)
pH: 7 (ref 5.0–8.0)

## 2013-05-02 LAB — CBC
Hemoglobin: 10.7 g/dL — ABNORMAL LOW (ref 12.0–15.0)
MCH: 26.4 pg (ref 26.0–34.0)
MCV: 80.5 fL (ref 78.0–100.0)
RBC: 4.06 MIL/uL (ref 3.87–5.11)
RDW: 16.2 % — ABNORMAL HIGH (ref 11.5–15.5)

## 2013-05-02 NOTE — Progress Notes (Signed)
Pulse: 120 1hr gtt today and 28 week labs due at 1053. Feels like baby gets in a position sometimes where she can't breathe, makes her feel like she is going to pass out. Having headaches for the past couple of weeks.

## 2013-05-02 NOTE — Progress Notes (Signed)
Reports headaches (frontal); denies congestion or sinus pressure; recommended taking two extra strength tylenol, no more than two times a day.  Obtain baseline PreX labs with 1 hr.

## 2013-05-03 ENCOUNTER — Encounter: Payer: Self-pay | Admitting: Family

## 2013-05-03 LAB — GLUCOSE TOLERANCE, 1 HOUR (50G) W/O FASTING: Glucose, 1 Hour GTT: 117 mg/dL (ref 70–140)

## 2013-05-07 ENCOUNTER — Other Ambulatory Visit: Payer: Self-pay

## 2013-05-07 DIAGNOSIS — O1210 Gestational proteinuria, unspecified trimester: Secondary | ICD-10-CM

## 2013-05-07 LAB — COMPREHENSIVE METABOLIC PANEL
ALT: 64 U/L — ABNORMAL HIGH (ref 0–35)
AST: 40 U/L — ABNORMAL HIGH (ref 0–37)
Albumin: 3.1 g/dL — ABNORMAL LOW (ref 3.5–5.2)
Alkaline Phosphatase: 78 U/L (ref 39–117)
Potassium: 4.3 mEq/L (ref 3.5–5.3)
Sodium: 138 mEq/L (ref 135–145)
Total Protein: 6.1 g/dL (ref 6.0–8.3)

## 2013-05-07 LAB — CBC
MCHC: 32.8 g/dL (ref 30.0–36.0)
RDW: 17 % — ABNORMAL HIGH (ref 11.5–15.5)

## 2013-05-08 ENCOUNTER — Other Ambulatory Visit: Payer: Self-pay | Admitting: Family

## 2013-05-08 LAB — PROTEIN, URINE, 24 HOUR
Protein, 24H Urine: 1301 mg/d — ABNORMAL HIGH (ref 50–100)
Protein, Urine: 43 mg/dL

## 2013-05-08 LAB — CREATININE CLEARANCE, URINE, 24 HOUR
Creatinine, 24H Ur: 5191 mg/d — ABNORMAL HIGH (ref 700–1800)
Creatinine, Urine: 171.6 mg/dL

## 2013-05-08 NOTE — Progress Notes (Signed)
Reviewed and OB hx and last visit with Dr. Shawnie Pons; pt needs to come in to hospital for admission for observation/BMZ.  Pt called at [phone], [phone], [phone] reported as not in service.  Father reached at [phone] and informed him that it is very important that pt returns call ASAP.  Father of pt out of town and will try to relay the message.

## 2013-05-09 ENCOUNTER — Encounter (HOSPITAL_COMMUNITY): Payer: Self-pay | Admitting: *Deleted

## 2013-05-09 ENCOUNTER — Ambulatory Visit (HOSPITAL_COMMUNITY): Payer: Medicaid Other

## 2013-05-09 ENCOUNTER — Inpatient Hospital Stay (HOSPITAL_COMMUNITY)
Admission: AD | Admit: 2013-05-09 | Discharge: 2013-05-14 | DRG: 781 | Disposition: A | Discharge status: Home / Self Care | Payer: Medicaid Other | Source: Ambulatory Visit | Attending: Obstetrics and Gynecology | Admitting: Obstetrics and Gynecology

## 2013-05-09 ENCOUNTER — Inpatient Hospital Stay (HOSPITAL_COMMUNITY): Payer: Medicaid Other

## 2013-05-09 DIAGNOSIS — IMO0002 Reserved for concepts with insufficient information to code with codable children: Principal | ICD-10-CM | POA: Diagnosis present

## 2013-05-09 DIAGNOSIS — O368390 Maternal care for abnormalities of the fetal heart rate or rhythm, unspecified trimester, not applicable or unspecified: Secondary | ICD-10-CM | POA: Diagnosis present

## 2013-05-09 DIAGNOSIS — O1210 Gestational proteinuria, unspecified trimester: Secondary | ICD-10-CM

## 2013-05-09 DIAGNOSIS — O1490 Unspecified pre-eclampsia, unspecified trimester: Secondary | ICD-10-CM

## 2013-05-09 DIAGNOSIS — O4700 False labor before 37 completed weeks of gestation, unspecified trimester: Secondary | ICD-10-CM

## 2013-05-09 LAB — COMPREHENSIVE METABOLIC PANEL
BUN: 6 mg/dL (ref 6–23)
CO2: 24 mEq/L (ref 19–32)
Calcium: 8.9 mg/dL (ref 8.4–10.5)
Chloride: 102 mEq/L (ref 96–112)
Creatinine, Ser: 0.61 mg/dL (ref 0.50–1.10)
GFR calc non Af Amer: >90 mL/min (ref >90)
Total Bilirubin: 0.3 mg/dL (ref 0.3–1.2)

## 2013-05-09 LAB — CBC
HCT: 31.3 % — ABNORMAL LOW (ref 36.0–46.0)
MCH: 27.7 pg (ref 26.0–34.0)
MCV: 83.2 fL (ref 78.0–100.0)
RBC: 3.76 MIL/uL — ABNORMAL LOW (ref 3.87–5.11)
RDW: 16.4 % — ABNORMAL HIGH (ref 11.5–15.5)
WBC: 9 10*3/uL (ref 4.0–10.5)

## 2013-05-09 MED ORDER — PRENATAL MULTIVITAMIN CH
1.0000 | ORAL_TABLET | Freq: Every day | ORAL | Status: DC
  Administered 2013-05-10 – 2013-05-14 (×6): 1 via ORAL
  Filled 2013-05-09 (×5): qty 1

## 2013-05-09 MED ORDER — ZOLPIDEM TARTRATE 5 MG PO TABS
5.0000 mg | ORAL_TABLET | Freq: Every evening | ORAL | Status: DC | PRN
  Administered 2013-05-09 – 2013-05-13 (×5): 5 mg via ORAL
  Filled 2013-05-09 (×5): qty 1

## 2013-05-09 MED ORDER — ACETAMINOPHEN 325 MG PO TABS: 650.0000 mg | ORAL_TABLET | ORAL | Status: DC | PRN

## 2013-05-09 MED ORDER — FERROUS SULFATE 325 (65 FE) MG PO TABS
325.0000 mg | ORAL_TABLET | Freq: Every day | ORAL | Status: DC
  Administered 2013-05-10 – 2013-05-14 (×5): 325 mg via ORAL
  Filled 2013-05-09 (×5): qty 1

## 2013-05-09 MED ORDER — OXYCODONE-ACETAMINOPHEN 5-325 MG PO TABS: 1.0000 | ORAL_TABLET | ORAL | Status: DC | PRN

## 2013-05-09 MED ORDER — BETAMETHASONE SOD PHOS & ACET 6 (3-3) MG/ML IJ SUSP
12.5000 mg | INTRAMUSCULAR | Status: AC
  Administered 2013-05-09 – 2013-05-10 (×2): 12.5 mg via INTRAMUSCULAR
  Filled 2013-05-09 (×2): qty 2.1

## 2013-05-09 MED ORDER — DOCUSATE SODIUM 100 MG PO CAPS
100.0000 mg | ORAL_CAPSULE | Freq: Every day | ORAL | Status: DC
  Administered 2013-05-10 – 2013-05-14 (×6): 100 mg via ORAL
  Filled 2013-05-09 (×5): qty 1

## 2013-05-09 MED ORDER — CALCIUM CARBONATE ANTACID 500 MG PO CHEW: 2.0000 | CHEWABLE_TABLET | ORAL | Status: DC | PRN

## 2013-05-09 MED ORDER — GI COCKTAIL ~~LOC~~
30.0000 mL | Freq: Two times a day (BID) | ORAL | Status: DC | PRN
  Administered 2013-05-09 – 2013-05-13 (×5): 30 mL via ORAL
  Filled 2013-05-09 (×5): qty 30

## 2013-05-09 NOTE — H&P (Signed)
Michelle Greer is a 17 y.o. female G1P0 at 38.5wks presenting for further eval of elevated preeclampsia labwork. Currently denies contractions, leaking, bldg, H/A, visual disturbances or RUQ pain. No swelling in LE. During her routine prenatal visit on 6/11 she described a frontal H/A which prompted the pre-e labwork, but that has since resolved. History OB History   Grav Para Term Preterm Abortions TAB SAB Ect Mult Living   1 0 0 0 0 0 0 0 0 0      Past Medical History  Diagnosis Date  . Bronchitis   . Bronchitis, acute    History reviewed. No pertinent past surgical history. Family History: family history includes Asthma in her mother. Social History:  reports that she has quit smoking. She has never used smokeless tobacco. She reports that she does not drink alcohol or use illicit drugs.   Prenatal Transfer Tool  Maternal Diabetes: No Genetic Screening: Normal Maternal Ultrasounds/Referrals: Normal Fetal Ultrasounds or other Referrals:  None Maternal Substance Abuse:  No Significant Maternal Medications:  None Significant Maternal Lab Results:  None Other Comments:  elevated preeclampsia labwork  ROS    Blood pressure 122/68, pulse 103, temperature 98.2 F (36.8 C), temperature source Axillary, resp. rate 16, height 5\' 4"  (1.626 m), weight 68.493 kg (151 lb), last menstrual period 10/27/2012. Maternal Exam:  Uterine Assessment: No ctx  Cervix: Exam deferred at present  Fetal Exam Fetal Monitor Review: Baseline rate: 130.  Variability: moderate (6-25 bpm).   Pattern: accelerations present and no decelerations.    Fetal State Assessment: Category I - tracings are normal.     Physical Exam  Constitutional: She is oriented to person, place, and time. She appears well-developed.  HENT:  Head: Normocephalic.  Neck: Normal range of motion.  Cardiovascular: Normal rate.   Respiratory: Effort normal.  GI:  Gravid, soft  Musculoskeletal: Normal range of motion.   Neurological: She is alert and oriented to person, place, and time.  Skin: Skin is warm and dry.  Psychiatric: She has a normal mood and affect. Her behavior is normal. Thought content normal.    CBC    Component Value Date/Time   WBC 9.0 05/09/2013 1020   RBC 3.76* 05/09/2013 1020   HGB 10.4* 05/09/2013 1020   HCT 31.3* 05/09/2013 1020   PLT 335 05/09/2013 1020   MCV 83.2 05/09/2013 1020   MCH 27.7 05/09/2013 1020   MCHC 33.2 05/09/2013 1020   RDW 16.4* 05/09/2013 1020   LYMPHSABS 0.5* 02/16/2013 1845   MONOABS 0.2 02/16/2013 1845   EOSABS 0.0 02/16/2013 1845   BASOSABS 0.0 02/16/2013 1845    CMP     Component Value Date/Time   NA 135 05/09/2013 1020   K 4.5 05/09/2013 1020   CL 102 05/09/2013 1020   CO2 24 05/09/2013 1020   GLUCOSE 76 05/09/2013 1020   BUN 6 05/09/2013 1020   CREATININE 0.61 05/09/2013 1020   CREATININE 0.67 05/07/2013 1324   CREATININE 0.67 05/07/2013 1320   CALCIUM 8.9 05/09/2013 1020   PROT 6.1 05/09/2013 1020   ALBUMIN 2.5* 05/09/2013 1020   AST 42* 05/09/2013 1020   ALT 56* 05/09/2013 1020   ALKPHOS 81 05/09/2013 1020   BILITOT 0.3 05/09/2013 1020   GFRNONAA >90 05/09/2013 1020   GFRAA >90 05/09/2013 1020   24 hour urine protein: 1301  Prenatal labs: ABO, Rh: O/POS/-- (02/10 1124) Antibody: NEG (02/10 1124) Rubella: 3.29 (02/10 1124) RPR: NON REAC (06/11 1108)  HBsAg: NEGATIVE (02/10 1124)  HIV:  NON REACTIVE (06/11 1108)  GBS:     Assessment/Plan: IUP at 27.5wks Elevated preeclampsia labwork Nl BPs  Admit to Antenatal MFM to consult and perform U/S NICU consult BMZ x 2   SHAW, KIMBERLY 05/09/2013, 12:46 PM

## 2013-05-09 NOTE — H&P (Signed)
Attestation of Attending Supervision of Advanced Practitioner (CNM/NP): Evaluation and management procedures were performed by the Advanced Practitioner under my supervision and collaboration.  I have reviewed the Advanced Practitioner's note and chart, and I agree with the management and plan.  Anothony Bursch 05/09/2013 4:41 PM

## 2013-05-09 NOTE — Consult Note (Signed)
MFM Note  Michelle Greer is an 17 year old AA G1 female at 27+[redacted] weeks gestation who was admitted this AM due to the results of her 24 hour urine total protein and LFTs. Michelle Greer stated that her pregnancy had been uneventful until last week when she began experiencing a frontal headache. She tried just "sleeping it off" and then took some Tylenol which did not give her much relief. Currently, she is pain free. During her last OBV, she also had +2 protein on her urine dip. Because of the headaches and urine protein, PE labs were obtained as well as a 24 hour urine collection. The total protein returned ~ 1,300 mgs and her LFTs were slightly elevated. Therefore, she was admitted for further work-up and observation for preeclampsia. Since admission, her blood pressures have remained very normal and she has no complaints currently - no headache or abdominal pain; no shortness of breath. She reports good fetal movement without painful contractions.  No significant medical, surgical or OB history; denies allergies to medication Family history: FOB - congenital heart defect repaired as child Social history: currently living in hotel with her mother  BP 123/58  [place] today revealed a normally grown, active fetus at 27+5 weeks; normal AFV; BPP 8/8  24 hour urine protein: 1,301 mg; AST 42; ALT 56  Assessment: 1) IUP at 27+5 weeks 2) Significant proteinuria, off and on headache, slight bump in LFTS and normal blood pressures - suspicious for atypical presentation of preeclampsia 3) Reassuring fetal status 4) Receiving course of BMZ  Recommendations: Would keep in house for a few days of observation with serial labs, BPs and clinical assessments. She may be able to be followed as an outpatient if all remains the same or improves and if she would be compliant with frequent OBVs.   (Face-to-face consultation with patient: 30 min)

## 2013-05-09 NOTE — Progress Notes (Signed)
Patient ID: Michelle Greer, female   DOB: [birth year], 17 y.o.   MRN: [id number] C/O pain under left rib. No epigastric pain or RUQ pain. Will try GI cocktail. If no improvement may have percocet.   [place], [place] 05/09/2013 10:48 PM

## 2013-05-09 NOTE — Consult Note (Signed)
Asked by Dr Jolayne Panther to speak to Michelle Greer for prenatal consult. Chart reviewed. Briefly, she is at 62 5/[redacted] wks gestation with proteinuria, under obs for preeclampsia, has normal fetal growth and reassuring fetal status, has GBS bacteriuria, and has started receiving betamethasone.  I discussed general outcome at 28 wks. I discussed common preterm morbidities such as RDS, various respiratory support, CNS, and ophthalmologic (ROP). Discussed UAC/UVC,  HAL, gavage feeding, temp support and LOS.   I also discussed benefits of breastfeeding.  She had no questions.  Thank you for this consult.  Total consult time: 25 min, face to face 20 min.  Michelle Greer Q

## 2013-05-10 ENCOUNTER — Inpatient Hospital Stay (HOSPITAL_COMMUNITY): Payer: Medicaid Other

## 2013-05-10 MED ORDER — LACTATED RINGERS IV BOLUS (SEPSIS)
500.0000 mL | Freq: Once | INTRAVENOUS | Status: AC
  Administered 2013-05-10: 500 mL via INTRAVENOUS

## 2013-05-10 NOTE — Progress Notes (Signed)
Called by RN re:  Contractions and a few decels  FHR is generally reassuring with intermittent variable decels, but has good varibility  UCs every 4-8 min  Discussed with Dr Jolayne Panther Will try fluid bolus and if necessary will add Procardia

## 2013-05-10 NOTE — Progress Notes (Signed)
Patient ID: Shizuko Wojdyla, female   DOB: Jan 08, 1996, 17 y.o.   MRN: [id number] FACULTY PRACTICE ANTEPARTUM(COMPREHENSIVE) NOTE  Twanda Stakes is a 17 y.o. G1P0000 at [redacted]w[redacted]d  who is admitted with preeclampsia.    Fetal presentation is cephalic. Length of Stay:  1  Days  Date of admission:05/09/2013  Subjective: Patient reports no complaints. She denies headache, visual disturbances, RUQ/epigastric pain, nausea or emesis Patient reports the fetal movement as active. Patient reports uterine contraction  activity as none. Patient reports  vaginal bleeding as none. Patient describes fluid per vagina as None.  Vitals:  Blood pressure 120/43, pulse 76, temperature 98.1 F (36.7 C), temperature source Oral, resp. rate 16, height 5\' 4"  (1.626 m), weight 151 lb (68.493 kg), last menstrual period 10/27/2012. Filed Vitals:   05/10/13 0600 05/10/13 0656 05/10/13 0734 05/10/13 0800  BP:   120/43   Pulse:   76   Temp:      TempSrc:      Resp: 18 18 16 16   Height:      Weight:       Physical Examination:  General appearance - alert, well appearing, and in no distress Fundal Height:  size equals dates Pelvic Exam:  examination not indicated Cervical Exam: Not evaluated. Extremities: extremities normal, atraumatic, no cyanosis or edema with DTRs 2+ bilaterally Membranes:intact  Fetal Monitoring:  Baseline: 130 bpm, Variability: Good {> 6 bpm), Accelerations: Reactive and Decelerations: Absent   reactive  Labs:  Results for orders placed during the hospital encounter of 05/09/13 (from the past 24 hour(s))  CBC   Collection Time    05/09/13 10:20 AM      Result Value Range   WBC 9.0  4.0 - 10.5 K/uL   RBC 3.76 (*) 3.87 - 5.11 MIL/uL   Hemoglobin 10.4 (*) 12.0 - 15.0 g/dL   HCT 91.4 (*) 78.2 - 95.6 %   MCV 83.2  78.0 - 100.0 fL   MCH 27.7  26.0 - 34.0 pg   MCHC 33.2  30.0 - 36.0 g/dL   RDW 21.3 (*) 08.6 - 57.8 %   Platelets 335  150 - 400 K/uL  COMPREHENSIVE METABOLIC PANEL   Collection Time     05/09/13 10:20 AM      Result Value Range   Sodium 135  135 - 145 mEq/L   Potassium 4.5  3.5 - 5.1 mEq/L   Chloride 102  96 - 112 mEq/L   CO2 24  19 - 32 mEq/L   Glucose, Bld 76  70 - 99 mg/dL   BUN 6  6 - 23 mg/dL   Creatinine, Ser 4.69  0.50 - 1.10 mg/dL   Calcium 8.9  8.4 - 62.9 mg/dL   Total Protein 6.1  6.0 - 8.3 g/dL   Albumin 2.5 (*) 3.5 - 5.2 g/dL   AST 42 (*) 0 - 37 U/L   ALT 56 (*) 0 - 35 U/L   Alkaline Phosphatase 81  39 - 117 U/L   Total Bilirubin 0.3  0.3 - 1.2 mg/dL   GFR calc non Af Amer >90  >90 mL/min   GFR calc Af Amer >90  >90 mL/min    Imaging Studies:    Currently EPIC will not allow sonographic studies to automatically populate into notes.  In the meantime, copy and paste results into note or free text.  Medications:  Scheduled . betamethasone acetate-betamethasone sodium phosphate  12.5 mg Intramuscular Q24 Hr x 2  . docusate sodium  100 mg  Oral Daily  . ferrous sulfate  325 mg Oral Q breakfast  . prenatal multivitamin  1 tablet Oral Q1200   I have reviewed the patient's current medications.  ASSESSMENT: Patient Active Problem List   Diagnosis Date Noted  . Supervision of normal first pregnancy 01/01/2013    PLAN: Continue inpatient observation throughout weekend per MFM Complete betamethasone today Continue monitoring BP  Minerva Bluett 05/10/2013,8:52 AM

## 2013-05-10 NOTE — Progress Notes (Signed)
UR completed 

## 2013-05-11 DIAGNOSIS — IMO0002 Reserved for concepts with insufficient information to code with codable children: Secondary | ICD-10-CM

## 2013-05-11 LAB — COMPREHENSIVE METABOLIC PANEL
AST: 29 U/L (ref 0–37)
Albumin: 2.7 g/dL — ABNORMAL LOW (ref 3.5–5.2)
Alkaline Phosphatase: 74 U/L (ref 39–117)
BUN: 6 mg/dL (ref 6–23)
Potassium: 4.3 mEq/L (ref 3.5–5.1)
Sodium: 134 mEq/L — ABNORMAL LOW (ref 135–145)
Total Protein: 6.6 g/dL (ref 6.0–8.3)

## 2013-05-11 LAB — CBC
HCT: 30.8 % — ABNORMAL LOW (ref 36.0–46.0)
MCHC: 32.8 g/dL (ref 30.0–36.0)
Platelets: 352 10*3/uL (ref 150–400)
RDW: 16.4 % — ABNORMAL HIGH (ref 11.5–15.5)
WBC: 14.5 10*3/uL — ABNORMAL HIGH (ref 4.0–10.5)

## 2013-05-11 NOTE — Progress Notes (Signed)
Patient ID: Michelle Greer, female   DOB: [birth year], 17 y.o.   MRN: [id number] FACULTY PRACTICE ANTEPARTUM(COMPREHENSIVE) NOTE  Michelle Greer is a 17 y.o. G1P0000 at [redacted]w[redacted]d  who is admitted with preeclampsia.    Fetal presentation is cephalic. Length of Stay:  2  Days  Date of admission:05/09/2013  Subjective: Patient reports no complaints. She denies headache, visual disturbances, RUQ/epigastric pain, nausea or emesis. Patient was not aware of contractions she had last night occuring every 4-8 hours Patient reports the fetal movement as active. Patient reports uterine contraction  activity as none. Patient reports  vaginal bleeding as none. Patient describes fluid per vagina as None.  Vitals:  Blood pressure 125/51, pulse 77, temperature 98.1 F (36.7 C), temperature source Oral, resp. rate 18, height 5\' 4"  (1.626 m), weight 151 lb (68.493 kg), last menstrual period 10/27/2012. Filed Vitals:   05/10/13 1200 05/10/13 1647 05/10/13 2000 05/10/13 2349  BP: 133/61 126/69 120/65 125/51  Pulse: 110 113 99 77  Temp:   98.3 F (36.8 C) 98.1 F (36.7 C)  TempSrc:   Oral Oral  Resp: 16 18 18 18   Height:      Weight:       Physical Examination:  General appearance - alert, well appearing, and in no distress Fundal Height:  size equals dates Pelvic Exam:  examination not indicated Cervical Exam: Not evaluated. Extremities: extremities normal, atraumatic, no cyanosis or edema with DTRs 2+ bilaterally Membranes:intact  Fetal Monitoring:  Baseline: 130 bpm, Variability: Good {> 6 bpm), Accelerations: Reactive and Decelerations: Variable: mild   reactive Toco: contractions resolved Labs:  Results for orders placed during the hospital encounter of 05/09/13 (from the past 24 hour(s))  CBC   Collection Time    05/11/13  5:45 AM      Result Value Range   WBC 14.5 (*) 4.0 - 10.5 K/uL   RBC 3.76 (*) 3.87 - 5.11 MIL/uL   Hemoglobin 10.1 (*) 12.0 - 15.0 g/dL   HCT 40.9 (*) 81.1 - 91.4 %   MCV 81.9   78.0 - 100.0 fL   MCH 26.9  26.0 - 34.0 pg   MCHC 32.8  30.0 - 36.0 g/dL   RDW 78.2 (*) 95.6 - 21.3 %   Platelets 352  150 - 400 K/uL  COMPREHENSIVE METABOLIC PANEL   Collection Time    05/11/13  5:45 AM      Result Value Range   Sodium 134 (*) 135 - 145 mEq/L   Potassium 4.3  3.5 - 5.1 mEq/L   Chloride 102  96 - 112 mEq/L   CO2 23  19 - 32 mEq/L   Glucose, Bld 100 (*) 70 - 99 mg/dL   BUN 6  6 - 23 mg/dL   Creatinine, Ser 0.86  0.50 - 1.10 mg/dL   Calcium 8.8  8.4 - 57.8 mg/dL   Total Protein 6.6  6.0 - 8.3 g/dL   Albumin 2.7 (*) 3.5 - 5.2 g/dL   AST 29  0 - 37 U/L   ALT 43 (*) 0 - 35 U/L   Alkaline Phosphatase 74  39 - 117 U/L   Total Bilirubin 0.2 (*) 0.3 - 1.2 mg/dL   GFR calc non Af Amer >90  >90 mL/min   GFR calc Af Amer >90  >90 mL/min    Imaging Studies:    Currently EPIC will not allow sonographic studies to automatically populate into notes.  In the meantime, copy and paste results into note or free  text.  Medications:  Scheduled . docusate sodium  100 mg Oral Daily  . ferrous sulfate  325 mg Oral Q breakfast  . prenatal multivitamin  1 tablet Oral Q1200   I have reviewed the patient's current medications.  ASSESSMENT: Patient Active Problem List   Diagnosis Date Noted  . Supervision of normal first pregnancy 01/01/2013    PLAN: Continue inpatient observation throughout weekend per MFM s/p betamethasone Continue monitoring BP  Trana Ressler 05/11/2013,6:45 AM

## 2013-05-12 DIAGNOSIS — O368390 Maternal care for abnormalities of the fetal heart rate or rhythm, unspecified trimester, not applicable or unspecified: Secondary | ICD-10-CM

## 2013-05-12 DIAGNOSIS — IMO0002 Reserved for concepts with insufficient information to code with codable children: Principal | ICD-10-CM

## 2013-05-12 DIAGNOSIS — O4700 False labor before 37 completed weeks of gestation, unspecified trimester: Secondary | ICD-10-CM

## 2013-05-12 MED ORDER — SODIUM CHLORIDE 0.9 % IJ SOLN
3.0000 mL | Freq: Two times a day (BID) | INTRAMUSCULAR | Status: DC
  Administered 2013-05-12: 3 mL via INTRAVENOUS

## 2013-05-12 NOTE — Progress Notes (Signed)
Patient ID: Michelle Greer, female   DOB: 24-Feb-1996, 17 y.o.   MRN: [id number]  FACULTY PRACTICE ANTEPARTUM(COMPREHENSIVE) NOTE  Michelle Greer is a 17 y.o. G1P0000 at [redacted]w[redacted]d  who is admitted for atypical presentation of preeclampsia.   Length of Stay:  3  Days  Subjective:  Patient reports the fetal movement as active. Patient reports uterine contraction  activity as none. Patient reports  vaginal bleeding as none. Patient describes fluid per vagina as None.  Vitals:  Blood pressure 126/67, pulse 116, temperature 98 F (36.7 C), temperature source Oral, resp. rate 18, height 5\' 4"  (1.626 m), weight 151 lb (68.493 kg), last menstrual period 10/27/2012. Physical Examination:  General appearance - alert, well appearing, and in no distress Abdomen - Abdomen:  Gravid, soft, non tender, non distended Extremities - Homan's sign negative bilaterally  Fetal Monitoring:  Baseline: 140 bpm, Variability: Good {> 6 bpm) and Accelerations: Reactive  Labs:  No results found for this or any previous visit (from the past 24 hour(s)). Will draw labs on Sunday  Medications:  Scheduled . docusate sodium  100 mg Oral Daily  . ferrous sulfate  325 mg Oral Q breakfast  . prenatal multivitamin  1 tablet Oral Q1200   I have reviewed the patient's current medications.  ASSESSMENT: Patient Active Problem List   Diagnosis Date Noted  . Supervision of normal first pregnancy 01/01/2013  Atypical presentation of preeclampsia, elevated LFTs and proteinuria  PLAN: S/p Betamethasone Continue observation Labs on Sunday Plan for d/c on Monday with outpt testing.  Shneur Whittenburg H. 05/12/2013,5:59 AM

## 2013-05-13 LAB — CBC
Hemoglobin: 10.3 g/dL — ABNORMAL LOW (ref 12.0–15.0)
MCHC: 33.3 g/dL (ref 30.0–36.0)
RDW: 16.5 % — ABNORMAL HIGH (ref 11.5–15.5)

## 2013-05-13 LAB — COMPREHENSIVE METABOLIC PANEL
ALT: 30 U/L (ref 0–35)
Alkaline Phosphatase: 71 U/L (ref 39–117)
BUN: 6 mg/dL (ref 6–23)
CO2: 25 mEq/L (ref 19–32)
Calcium: 8.9 mg/dL (ref 8.4–10.5)
GFR calc Af Amer: >90 mL/min (ref >90)
GFR calc non Af Amer: >90 mL/min (ref >90)
Glucose, Bld: 78 mg/dL (ref 70–99)
Sodium: 132 mEq/L — ABNORMAL LOW (ref 135–145)

## 2013-05-13 NOTE — Progress Notes (Signed)
Patient ID: Michelle Greer, female   DOB: 1996-07-16, 17 y.o.   MRN: [id number] FACULTY PRACTICE ANTEPARTUM(COMPREHENSIVE) NOTE  Michelle Greer is a 17 y.o. G1P0000 at [redacted]w[redacted]d who is admitted for atypical presentation of preeclampsia.  Length of Stay: 4 Days  Subjective:  Patient reports the fetal movement as active.  Patient reports uterine contraction activity as none.  Patient reports vaginal bleeding as none.  Patient describes fluid per vagina as None.  Vitals: Blood pressure 126/67, pulse 116, temperature 98 F (36.7 C), temperature source Oral, resp. rate 18, height 5\' 4"  (1.626 m), weight 151 lb (68.493 kg), last menstrual period 10/27/2012.  Physical Examination:  General appearance - alert, well appearing, and in no distress  Abdomen - Abdomen: Gravid, soft, non tender, non distended  Extremities - Homan's sign negative bilaterally  DTRs- 2+ bilaterally Fetal Monitoring: Baseline: 140 bpm, Variability: Good {> 6 bpm) and Accelerations: Reactive  Labs: pending No results found for this or any previous visit (from the past 24 hour(s)).    Medications: Scheduled  .  docusate sodium  100 mg  Oral  Daily   .  ferrous sulfate  325 mg  Oral  Q breakfast   .  prenatal multivitamin  1 tablet  Oral  Q1200   I have reviewed the patient's current medications.  ASSESSMENT:  Patient Active Problem List    Diagnosis  Date Noted   .  Supervision of normal first pregnancy  01/01/2013   Atypical presentation of preeclampsia, elevated LFTs and proteinuria  PLAN:  S/p Betamethasone  Continue observation  Labs on Sunday  Plan for d/c on Monday with outpt testing.

## 2013-05-14 DIAGNOSIS — O1210 Gestational proteinuria, unspecified trimester: Secondary | ICD-10-CM

## 2013-05-14 LAB — COMPREHENSIVE METABOLIC PANEL
ALT: 28 U/L (ref 0–35)
AST: 22 U/L (ref 0–37)
CO2: 27 mEq/L (ref 19–32)
Chloride: 100 mEq/L (ref 96–112)
GFR calc non Af Amer: >90 mL/min (ref >90)
Potassium: 4.5 mEq/L (ref 3.5–5.1)
Sodium: 134 mEq/L — ABNORMAL LOW (ref 135–145)
Total Bilirubin: 0.2 mg/dL — ABNORMAL LOW (ref 0.3–1.2)

## 2013-05-14 LAB — CBC
Platelets: 402 10*3/uL — ABNORMAL HIGH (ref 150–400)
RBC: 3.88 MIL/uL (ref 3.87–5.11)
WBC: 12.3 10*3/uL — ABNORMAL HIGH (ref 4.0–10.5)

## 2013-05-14 MED ORDER — DSS 100 MG PO CAPS: 100.0000 mg | ORAL_CAPSULE | Freq: Every day | ORAL | Status: DC

## 2013-05-14 NOTE — Progress Notes (Signed)
Ur chart review completed.  

## 2013-05-14 NOTE — Discharge Summary (Signed)
Physician Discharge Summary  Patient ID: Michelle Greer MRN: [id number] DOB/AGE: [birth year] 17 y.o.  Admit date: 05/09/2013 Discharge date: 05/14/2013  Admission Diagnoses:  17 yo at [redacted] weeks EGA with proteinuria and elevated liver enzymes; r/o preeclampsia  Discharge Diagnoses: 17 yo at [redacted] weeks EGA with proteinuria, normalized liver enzymes, with poosible atypical presentation of preeclampsia Active Problems:   Proteinuria complicating pregnancy in third trimester   Discharged Condition: good  Hospital Course: Pt was admitted for observation afterunexplained proteinuria and elevated liver enzymes were discovered.  Pt is normotensive.  Pt received betamethasone on 6/18 and 6/19.   She had nml fetal testing throughout the admission.  Her liver enzymes normalized.  Pt stable for discharge with close outpatient surveillance.   Consults: MFM  Significant Diagnostic Studies: labs: cbc, cmet and radiology: Ultrasound: BPP, growth  Treatments: betamethasone  Discharge Exam: Blood pressure 112/68, pulse 94, temperature 98.1 F (36.7 C), temperature source Oral, resp. rate 18, height 5\' 4"  (1.626 m), weight 151 lb (68.493 kg), last menstrual period 10/27/2012. General appearance: alert, cooperative and no distress GI: soft, NT, no RUQ pain Extremities: Homans sign is negative, no sign of DVT NST--reactive  CBC    Component Value Date/Time   WBC 12.3* 05/14/2013 0625   RBC 3.88 05/14/2013 0625   HGB 10.4* 05/14/2013 0625   HCT 32.1* 05/14/2013 0625   PLT 402* 05/14/2013 0625   MCV 82.7 05/14/2013 0625   MCH 26.8 05/14/2013 0625   MCHC 32.4 05/14/2013 0625   RDW 16.4* 05/14/2013 0625   LYMPHSABS 0.5* 02/16/2013 1845   MONOABS 0.2 02/16/2013 1845   EOSABS 0.0 02/16/2013 1845   BASOSABS 0.0 02/16/2013 1845    CMP     Component Value Date/Time   NA 134* 05/14/2013 0625   K 4.5 05/14/2013 0625   CL 100 05/14/2013 0625   CO2 27 05/14/2013 0625   GLUCOSE 84 05/14/2013 0625   BUN 7 05/14/2013 0625    CREATININE 0.65 05/14/2013 0625   CREATININE 0.67 05/07/2013 1324   CREATININE 0.67 05/07/2013 1320   CALCIUM 8.7 05/14/2013 0625   PROT 6.2 05/14/2013 0625   ALBUMIN 2.6* 05/14/2013 0625   AST 22 05/14/2013 0625   ALT 28 05/14/2013 0625   ALKPHOS 73 05/14/2013 0625   BILITOT 0.2* 05/14/2013 0625   GFRNONAA >90 05/14/2013 0625   GFRAA >90 05/14/2013 1191     Disposition: 01-Home or Self Care   Future Appointments Provider Department Dept Phone   05/16/2013 8:45 AM Aviva Signs, CNM [facility] [phone]       Medication List    TAKE these medications       DSS 100 MG Caps  Take 100 mg by mouth daily.     ferrous sulfate 325 (65 FE) MG tablet  Take 1 tablet (325 mg total) by mouth daily with breakfast.     ondansetron 4 MG tablet  Commonly known as:  ZOFRAN  Take 4 mg by mouth every 6 (six) hours.     prenatal multivitamin Tabs  Take 1 tablet by mouth every morning.     TYLENOL PO  Take 2 tablets by mouth every 6 (six) hours as needed (for pain).           Follow-up Information   Follow up with [facility] In 1 week. (Needs NST aand AFI this Thursday at MFM)    Contact information:   [address] [place] [address]  NST reactive Labs nml 2x week testing with close follow up  Signed: Cannen Dupras H. 05/14/2013, 6:32 AM

## 2013-05-15 ENCOUNTER — Other Ambulatory Visit: Payer: Self-pay | Admitting: Advanced Practice Midwife

## 2013-05-15 DIAGNOSIS — IMO0002 Reserved for concepts with insufficient information to code with codable children: Secondary | ICD-10-CM

## 2013-05-16 ENCOUNTER — Ambulatory Visit (INDEPENDENT_AMBULATORY_CARE_PROVIDER_SITE_OTHER): Payer: Medicaid Other | Admitting: Advanced Practice Midwife

## 2013-05-16 ENCOUNTER — Encounter: Payer: Self-pay | Admitting: Advanced Practice Midwife

## 2013-05-16 VITALS — BP 133/71 | Temp 97.4°F | Wt 151.0 lb

## 2013-05-16 DIAGNOSIS — O26839 Pregnancy related renal disease, unspecified trimester: Secondary | ICD-10-CM

## 2013-05-16 DIAGNOSIS — O1213 Gestational proteinuria, third trimester: Secondary | ICD-10-CM

## 2013-05-16 LAB — POCT URINALYSIS DIP (DEVICE)
Ketones, ur: NEGATIVE mg/dL
Nitrite: NEGATIVE
Protein, ur: 30 mg/dL — AB
pH: 7 (ref 5.0–8.0)

## 2013-05-16 NOTE — Progress Notes (Signed)
Pulse 97 Edema trace in feet.

## 2013-05-16 NOTE — Progress Notes (Signed)
Doing well. No edema. No headaches or visual changes. Start 2x/wk testing today

## 2013-05-16 NOTE — Patient Instructions (Signed)
Glucose Tolerance Test This is a test to see how your body processes carbohydrates. This test is often done to check patients for diabetes or the possibility of developing it. PREPARATION FOR TEST You should have nothing to eat or drink 12 hours before the test. You will be given a form of sugar (glucose) and then blood samples will be drawn from your vein to determine the level of sugar in your blood. Alternatively, blood may be drawn from your finger for testing. You should not smoke or exercise during the test. NORMAL FINDINGS  Fasting: 70-115 mg/dL  30 minutes: less than 200 mg/dL  1 hour: less than 200 mg/dL  2 hours: less than 140 mg/dL  3 hours: 70-115 mg/dL  4 hours: 70-115 mg/dL Ranges for normal findings may vary among different laboratories and hospitals. You should always check with your doctor after having lab work or other tests done to discuss the meaning of your test results and whether your values are considered within normal limits. MEANING OF TEST Your caregiver will go over the test results with you and discuss the importance and meaning of your results, as well as treatment options and the need for additional tests. OBTAINING THE TEST RESULTS It is your responsibility to obtain your test results. Ask the lab or department performing the test when and how you will get your results. Document Released: 12/01/2004 Document Revised: 01/31/2012 Document Reviewed: 10/19/2008 [employer] Patient Information 2014 [employer]  

## 2013-05-17 ENCOUNTER — Ambulatory Visit (HOSPITAL_COMMUNITY): Payer: Medicaid Other

## 2013-05-21 ENCOUNTER — Other Ambulatory Visit: Payer: Medicaid Other

## 2013-05-24 ENCOUNTER — Ambulatory Visit (INDEPENDENT_AMBULATORY_CARE_PROVIDER_SITE_OTHER): Payer: Medicaid Other | Admitting: Family Medicine

## 2013-05-24 ENCOUNTER — Ambulatory Visit (HOSPITAL_COMMUNITY): Payer: Medicaid Other

## 2013-05-24 VITALS — BP 118/74 | Temp 97.5°F | Wt 152.4 lb

## 2013-05-24 DIAGNOSIS — O26839 Pregnancy related renal disease, unspecified trimester: Secondary | ICD-10-CM

## 2013-05-24 DIAGNOSIS — O1213 Gestational proteinuria, third trimester: Secondary | ICD-10-CM

## 2013-05-24 LAB — POCT URINALYSIS DIP (DEVICE)
Protein, ur: NEGATIVE mg/dL
Specific Gravity, Urine: 1.015 (ref 1.005–1.030)
Urobilinogen, UA: 0.2 mg/dL (ref 0.0–1.0)

## 2013-05-24 NOTE — Patient Instructions (Signed)
Preeclampsia and Eclampsia Preeclampsia is a condition of high blood pressure during pregnancy. It can happen at 20 weeks or later in pregnancy. If high blood pressure occurs in the second half of pregnancy with no other symptoms, it is called gestational hypertension and goes away after the baby is born. If any of the symptoms listed below develop with gestational hypertension, it is then called preeclampsia. Eclampsia (convulsions) may follow preeclampsia. This is one of the reasons for regular prenatal checkups. Early diagnosis and treatment are very important to prevent eclampsia. CAUSES  There is no known cause of preeclampsia/eclampsia in pregnancy. There are several known conditions that may put the pregnant woman at risk, such as:  The first pregnancy.  Having preeclampsia in a past pregnancy.  Having lasting (chronic) high blood pressure.  Having multiples (twins, triplets).  Being age 35 or older.  African American ethnic background.  Having kidney disease or diabetes.  Medical conditions such as lupus or blood diseases.  Being overweight (obese). SYMPTOMS   High blood pressure.  Headaches.  Sudden weight gain.  Swelling of hands, face, legs, and feet.  Protein in the urine.  Feeling sick to your stomach (nauseous) and throwing up (vomiting).  Vision problems (blurred or double vision).  Numbness in the face, arms, legs, and feet.  Dizziness.  Slurred speech.  Preeclampsia can cause growth retardation in the fetus.  Separation (abruption) of the placenta.  Not enough fluid in the amniotic sac (oligohydramnios).  Sensitivity to bright lights.  Belly (abdominal) pain. DIAGNOSIS  If protein is found in the urine in the second half of pregnancy, this is considered preeclampsia. Other symptoms mentioned above may also be present. TREATMENT  It is necessary to treat this.  Your caregiver may prescribe bed rest early in this condition. Plenty of rest and  salt restriction may be all that is needed.  Medicines may be necessary to lower blood pressure if the condition does not respond to more conservative measures.  In more severe cases, hospitalization may be needed:  For treatment of blood pressure.  To control fluid retention.  To monitor the baby to see if the condition is causing harm to the baby.  Hospitalization is the best way to treat the first sign of preeclampsia. This is so the mother and baby can be watched closely and blood tests can be done effectively and correctly.  If the condition becomes severe, it may be necessary to induce labor or to remove the infant by surgical means (cesarean section). The best cure for preeclampsia/eclampsia is to deliver the baby. Preeclampsia and eclampsia involve risks to mother and infant. Your caregiver will discuss these risks with you. Together, you can work out the best possible approach to your problems. Make sure you keep your prenatal visits as scheduled. Not keeping appointments could result in a chronic or permanent injury, pain, disability to you, and death or injury to you or your unborn baby. If there is any problem keeping the appointment, you must call to reschedule. HOME CARE INSTRUCTIONS   Keep your prenatal appointments and tests as scheduled.  Tell your caregiver if you have any of the above risk factors.  Get plenty of rest and sleep.  Eat a balanced diet that is low in salt, and do not add salt to your food.  Avoid stressful situations.  Only take over-the-counter and prescriptions medicines for pain, discomfort, or fever as directed by your caregiver. SEEK IMMEDIATE MEDICAL CARE IF:   You develop severe swelling   anywhere in the body. This usually occurs in the legs.  You gain 5 lb/2.3 kg or more in a week.  You develop a severe headache, dizziness, problems with your vision, or confusion.  You have abdominal pain, nausea, or vomiting.  You have a seizure.  You  have trouble moving any part of your body, or you develop numbness or problems speaking.  You have bruising or abnormal bleeding from anywhere in the body.  You develop a stiff neck.  You pass out. MAKE SURE YOU:   Understand these instructions.  Will watch your condition.  Will get help right away if you are not doing well or get worse. Document Released: 11/05/2000 Document Revised: 01/31/2012 Document Reviewed: 06/21/2008 [employer] Patient Information 2014 [employer].  Breastfeeding A change in hormones during your pregnancy causes growth of your breast tissue and an increase in number and size of milk ducts. The hormone prolactin allows proteins, sugars, and fats from your blood supply to make breast milk in your milk-producing glands. The hormone progesterone prevents breast milk from being released before the birth of your baby. After the birth of your baby, your progesterone level decreases allowing breast milk to be released. Thoughts of your baby, as well as his or her sucking or crying, can stimulate the release of milk from the milk-producing glands. Deciding to breastfeed (nurse) is one of the best choices you can make for you and your baby. The information that follows gives a brief review of the benefits, as well as other important skills to know about breastfeeding. BENEFITS OF BREASTFEEDING For your baby  The first milk (colostrum) helps your baby's digestive system function better.   There are antibodies in your milk that help your baby fight off infections.   Your baby has a lower incidence of asthma, allergies, and sudden infant death syndrome (SIDS).   The nutrients in breast milk are better for your baby than infant formulas.  Breast milk improves your baby's brain development.   Your baby will have less gas, colic, and constipation.  Your baby is less likely to develop other conditions, such as childhood obesity, asthma, or diabetes mellitus. For  you  Breastfeeding helps develop a very special bond between you and your baby.   Breastfeeding is convenient, always available at the correct temperature, and costs nothing.   Breastfeeding helps to burn calories and helps you lose the weight gained during pregnancy.   Breastfeeding makes your uterus contract back down to normal size faster and slows bleeding following delivery.   Breastfeeding mothers have a lower risk of developing osteoporosis or breast or ovarian cancer later in life.  BREASTFEEDING FREQUENCY  A healthy, full-term baby may breastfeed as often as every hour or space his or her feedings to every 3 hours. Breastfeeding frequency will vary from baby to baby.   Newborns should be fed no less than every 2 3 hours during the day and every 4 5 hours during the night. You should breastfeed a minimum of 8 feedings in a 24 hour period.  Awaken your baby to breastfeed if it has been 3 4 hours since the last feeding.  Breastfeed when you feel the need to reduce the fullness of your breasts or when your newborn shows signs of hunger. Signs that your baby may be hungry include:  Increased alertness or activity.  Stretching.  Movement of the head from side to side.  Movement of the head and opening of the mouth when the corner of   the mouth or cheek is stroked (rooting).  Increased sucking sounds, smacking lips, cooing, sighing, or squeaking.  Hand-to-mouth movements.  Increased sucking of fingers or hands.  Fussing.  Intermittent crying.  Signs of extreme hunger will require calming and consoling before you try to feed your baby. Signs of extreme hunger may include:  Restlessness.  A loud, strong cry.  Screaming.  Frequent feeding will help you make more milk and will help prevent problems, such as sore nipples and engorgement of the breasts.  BREASTFEEDING   Whether lying down or sitting, be sure that the baby's abdomen is facing your abdomen.    Support your breast with 4 fingers under your breast and your thumb above your nipple. Make sure your fingers are well away from your nipple and your baby's mouth.   Stroke your baby's lips gently with your finger or nipple.   When your baby's mouth is open wide enough, place all of your nipple and as much of the colored area around your nipple (areola) as possible into your baby's mouth.  More areola should be visible above his or her upper lip than below his or her lower lip.  Your baby's tongue should be between his or her lower gum and your breast.  Ensure that your baby's mouth is correctly positioned around the nipple (latched). Your baby's lips should create a seal on your breast.  Signs that your baby has effectively latched onto your nipple include:  Tugging or sucking without pain.  Swallowing heard between sucks.  Absent click or smacking sound.  Muscle movement above and in front of his or her ears with sucking.  Your baby must suck about 2 3 minutes in order to get your milk. Allow your baby to feed on each breast as long as he or she wants. Nurse your baby until he or she unlatches or falls asleep at the first breast, then offer the second breast.  Signs that your baby is full and satisfied include:  A gradual decrease in the number of sucks or complete cessation of sucking.  Falling asleep.  Extension or relaxation of his or her body.  Retention of a small amount of milk in his or her mouth.  Letting go of your breast by himself or herself.  Signs of effective breastfeeding in you include:  Breasts that have increased firmness, weight, and size prior to feeding.  Breasts that are softer after nursing.  Increased milk volume, as well as a change in milk consistency and color by the 5th day of breastfeeding.  Breast fullness relieved by breastfeeding.  Nipples are not sore, cracked, or bleeding.  If needed, break the suction by putting your finger  into the corner of your baby's mouth and sliding your finger between his or her gums. Then, remove your breast from his or her mouth.  It is common for babies to spit up a small amount after a feeding.  Babies often swallow air during feeding. This can make babies fussy. Burping your baby between breasts can help with this.  Vitamin D supplements are recommended for babies who get only breast milk.  Avoid using a pacifier during your baby's first 4 6 weeks.  Avoid supplemental feedings of water, formula, or juice in place of breastfeeding. Breast milk is all the food your baby needs. It is not necessary for your baby to have water or formula. Your breasts will make more milk if supplemental feedings are avoided during the early weeks. HOW TO   TELL WHETHER YOUR BABY IS GETTING ENOUGH BREAST MILK Wondering whether or not your baby is getting enough milk is a common concern among mothers. You can be assured that your baby is getting enough milk if:   Your baby is actively sucking and you hear swallowing.   Your baby seems relaxed and satisfied after a feeding.   Your baby nurses at least 8 12 times in a 24 hour time period.  During the first 3 5 days of age:  Your baby is wetting at least 3 5 diapers in a 24 hour period. The urine should be clear and pale yellow.  Your baby is having at least 3 4 stools in a 24 hour period. The stool should be soft and yellow.  At 5 7 days of age, your baby is having at least 3 6 stools in a 24 hour period. The stool should be seedy and yellow by 5 days of age.  Your baby has a weight loss less than 7 10% during the first 3 days of age.  Your baby does not lose weight after 3 7 days of age.  Your baby gains 4 7 ounces each week after he or she is 4 days of age.  Your baby gains weight by 5 days of age and is back to birth weight within 2 weeks. ENGORGEMENT In the first week after your baby is born, you may experience extremely full breasts  (engorgement). When engorged, your breasts may feel heavy, warm, or tender to the touch. Engorgement peaks within 24 48 hours after delivery of your baby.  Engorgement may be reduced by:  Continuing to breastfeed.  Increasing the frequency of breastfeeding.  Taking warm showers or applying warm, moist heat to your breasts just before each feeding. This increases circulation and helps the milk flow.   Gently massaging your breast before and during the feedings. With your fingertips, massage from your chest wall towards your nipple in a circular motion.   Ensuring that your baby empties at least one breast at every feeding. It also helps to start the next feeding on the opposite breast.   Expressing breast milk by hand or by using a breast pump to empty the breasts if your baby is sleepy, or not nursing well. You may also want to express milk if you are returning to work oryou feel you are getting engorged.  Ensuring your baby is latched on and positioned properly while breastfeeding. If you follow these suggestions, your engorgement should improve in 24 48 hours. If you are still experiencing difficulty, call your lactation consultant or caregiver.  CARING FOR YOURSELF Take care of your breasts.  Bathe or shower daily.   Avoid using soap on your nipples.   Wear a supportive bra. Avoid wearing underwire style bras.  Air dry your nipples for a 3 4minutes after each feeding.   Use only cotton bra pads to absorb breast milk leakage. Leaking of breast milk between feedings is normal.   Use only pure lanolin on your nipples after nursing. You do not need to wash it off before feeding your baby again. Another option is to express a few drops of breast milk and gently massage that milk into your nipples.  Continue breast self-awareness checks. Take care of yourself.  Eat healthy foods. Alternate 3 meals with 3 snacks.  Avoid foods that you notice affect your baby in a bad  way.  Drink milk, fruit juice, and water to satisfy your thirst (about 8   glasses a day).   Rest often, relax, and take your prenatal vitamins to prevent fatigue, stress, and anemia.  Avoid chewing and smoking tobacco.  Avoid alcohol and drug use.  Take over-the-counter and prescribed medicine only as directed by your caregiver or pharmacist. You should always check with your caregiver or pharmacist before taking any new medicine, vitamin, or herbal supplement.  Know that pregnancy is possible while breastfeeding. If desired, talk to your caregiver about family planning and safe birth control methods that may be used while breastfeeding. SEEK MEDICAL CARE IF:   You feel like you want to stop breastfeeding or have become frustrated with breastfeeding.  You have painful breasts or nipples.  Your nipples are cracked or bleeding.  Your breasts are red, tender, or warm.  You have a swollen area on either breast.  You have a fever or chills.  You have nausea or vomiting.  You have drainage from your nipples.  Your breasts do not become full before feedings by the 5th day after delivery.  You feel sad and depressed.  Your baby is too sleepy to eat well.  Your baby is having trouble sleeping.   Your baby is wetting less than 3 diapers in a 24 hour period.  Your baby has less than 3 stools in a 24 hour period.  Your baby's skin or the white part of his or her eyes becomes more yellow.   Your baby is not gaining weight by 5 days of age. MAKE SURE YOU:   Understand these instructions.  Will watch your condition.  Will get help right away if you are not doing well or get worse. Document Released: 11/08/2005 Document Revised: 08/02/2012 Document Reviewed: 06/14/2012 [employer] Patient Information 2014 [employer].  

## 2013-05-24 NOTE — Progress Notes (Signed)
NST reviewed and reactive. Full BPP 8/8 today Neg. Protein today, Continues to be normotensive.  It is not clear what happened with this pt.  ? Something viral.  Pre-eclampsia seems somewhat lower on the differential since she certainly has not progressed.--she will get repeat U/S for growth and BPP next week with [facility].  If remains with approp. Grown fetus and normotensive and no protein, consider stopping testing.

## 2013-05-24 NOTE — Progress Notes (Signed)
Pulse- 105 Patient reports feeling uncomfortable and pressure with how baby is laying

## 2013-05-30 ENCOUNTER — Ambulatory Visit (HOSPITAL_COMMUNITY): Admission: RE | Admit: 2013-05-30 | Payer: Medicaid Other | Source: Ambulatory Visit

## 2013-05-31 ENCOUNTER — Other Ambulatory Visit: Payer: Medicaid Other

## 2013-06-01 ENCOUNTER — Other Ambulatory Visit: Payer: Medicaid Other

## 2013-06-01 ENCOUNTER — Encounter: Payer: Self-pay | Admitting: Advanced Practice Midwife

## 2013-06-01 ENCOUNTER — Ambulatory Visit (INDEPENDENT_AMBULATORY_CARE_PROVIDER_SITE_OTHER): Payer: Medicaid Other | Admitting: Advanced Practice Midwife

## 2013-06-01 VITALS — BP 120/69 | Wt 153.6 lb

## 2013-06-01 DIAGNOSIS — O1213 Gestational proteinuria, third trimester: Secondary | ICD-10-CM

## 2013-06-01 DIAGNOSIS — O26839 Pregnancy related renal disease, unspecified trimester: Secondary | ICD-10-CM

## 2013-06-01 LAB — POCT URINALYSIS DIP (DEVICE)
Hgb urine dipstick: NEGATIVE
Ketones, ur: NEGATIVE mg/dL
Protein, ur: 30 mg/dL — AB
Specific Gravity, Urine: 1.02 (ref 1.005–1.030)
pH: 7 (ref 5.0–8.0)

## 2013-06-01 NOTE — Progress Notes (Signed)
P = 98  Pt reports intermittent pain @ [facility], also having some back pain and pelvic pressure.   Pt has appt @ [facility] on 7/17 for growth US/BPP.

## 2013-06-01 NOTE — Patient Instructions (Addendum)

## 2013-06-01 NOTE — Progress Notes (Signed)
Doing ok but has some right rib pain at night. Probably costochondritis from softening of cartilage. Tylenol PRN  NST reactive.

## 2013-06-07 ENCOUNTER — Ambulatory Visit (HOSPITAL_COMMUNITY): Payer: Medicaid Other | Attending: Family Medicine

## 2013-06-08 ENCOUNTER — Other Ambulatory Visit: Payer: Medicaid Other

## 2013-06-18 ENCOUNTER — Other Ambulatory Visit: Payer: Self-pay | Admitting: Obstetrics & Gynecology

## 2013-06-18 DIAGNOSIS — O1490 Unspecified pre-eclampsia, unspecified trimester: Secondary | ICD-10-CM

## 2013-06-20 ENCOUNTER — Ambulatory Visit (HOSPITAL_COMMUNITY): Payer: Medicaid Other

## 2013-07-02 ENCOUNTER — Encounter: Payer: Self-pay | Admitting: *Deleted

## 2013-07-02 LAB — FETAL NONSTRESS TEST

## 2013-07-03 ENCOUNTER — Ambulatory Visit (HOSPITAL_COMMUNITY)
Admission: RE | Admit: 2013-07-03 | Discharge: 2013-07-03 | Disposition: A | Discharge status: Home / Self Care | Payer: Medicaid Other | Source: Ambulatory Visit | Attending: Obstetrics & Gynecology | Admitting: Obstetrics & Gynecology

## 2013-07-03 DIAGNOSIS — IMO0002 Reserved for concepts with insufficient information to code with codable children: Secondary | ICD-10-CM | POA: Insufficient documentation

## 2013-07-03 DIAGNOSIS — O1490 Unspecified pre-eclampsia, unspecified trimester: Secondary | ICD-10-CM

## 2013-07-03 DIAGNOSIS — Z3689 Encounter for other specified antenatal screening: Secondary | ICD-10-CM | POA: Insufficient documentation

## 2013-07-05 ENCOUNTER — Encounter: Payer: Self-pay | Admitting: Advanced Practice Midwife

## 2013-07-05 ENCOUNTER — Other Ambulatory Visit: Payer: Medicaid Other

## 2013-07-06 ENCOUNTER — Telehealth: Payer: Self-pay | Admitting: Obstetrics and Gynecology

## 2013-07-06 NOTE — Telephone Encounter (Signed)
Called patient to reschedule her appointment, but her phone is off

## 2013-07-12 ENCOUNTER — Other Ambulatory Visit: Payer: Medicaid Other

## 2013-07-16 ENCOUNTER — Encounter (HOSPITAL_COMMUNITY): Payer: Self-pay | Admitting: Anesthesiology

## 2013-07-16 ENCOUNTER — Encounter (HOSPITAL_COMMUNITY): Payer: Self-pay | Admitting: *Deleted

## 2013-07-16 ENCOUNTER — Inpatient Hospital Stay (HOSPITAL_COMMUNITY)
Admission: AD | Admit: 2013-07-16 | Discharge: 2013-07-18 | DRG: 775 | Disposition: A | Discharge status: Home / Self Care | Payer: Medicaid Other | Source: Ambulatory Visit | Attending: Obstetrics & Gynecology | Admitting: Obstetrics & Gynecology

## 2013-07-16 ENCOUNTER — Inpatient Hospital Stay (HOSPITAL_COMMUNITY): Payer: Medicaid Other | Admitting: Anesthesiology

## 2013-07-16 DIAGNOSIS — O1213 Gestational proteinuria, third trimester: Secondary | ICD-10-CM

## 2013-07-16 DIAGNOSIS — O9989 Other specified diseases and conditions complicating pregnancy, childbirth and the puerperium: Secondary | ICD-10-CM

## 2013-07-16 DIAGNOSIS — Z22330 Carrier of Group B streptococcus: Secondary | ICD-10-CM

## 2013-07-16 DIAGNOSIS — O99892 Other specified diseases and conditions complicating childbirth: Principal | ICD-10-CM | POA: Diagnosis present

## 2013-07-16 HISTORY — DX: Anemia, unspecified: D64.9

## 2013-07-16 HISTORY — DX: Major depressive disorder, single episode, unspecified: F32.9

## 2013-07-16 HISTORY — DX: Depression, unspecified: F32.A

## 2013-07-16 LAB — CBC
HCT: 35.1 % — ABNORMAL LOW (ref 36.0–46.0)
MCV: 80.3 fL (ref 78.0–100.0)
RBC: 4.37 MIL/uL (ref 3.87–5.11)
RDW: 16.3 % — ABNORMAL HIGH (ref 11.5–15.5)
WBC: 9.2 10*3/uL (ref 4.0–10.5)

## 2013-07-16 MED ORDER — TERBUTALINE SULFATE 1 MG/ML IJ SOLN: 0.2500 mg | Freq: Once | INTRAMUSCULAR | Status: AC | PRN

## 2013-07-16 MED ORDER — OXYTOCIN 40 UNITS IN LACTATED RINGERS INFUSION - SIMPLE MED
1.0000 m[IU]/min | INTRAVENOUS | Status: DC
  Administered 2013-07-16: 2 m[IU]/min via INTRAVENOUS
  Filled 2013-07-16: qty 1000

## 2013-07-16 MED ORDER — OXYTOCIN BOLUS FROM INFUSION
500.0000 mL | INTRAVENOUS | Status: DC
  Administered 2013-07-16: 500 mL via INTRAVENOUS

## 2013-07-16 MED ORDER — LACTATED RINGERS IV SOLN: 500.0000 mL | Freq: Once | INTRAVENOUS | Status: DC

## 2013-07-16 MED ORDER — LACTATED RINGERS IV SOLN
500.0000 mL | INTRAVENOUS | Status: DC | PRN
  Administered 2013-07-16: 500 mL via INTRAVENOUS

## 2013-07-16 MED ORDER — PHENYLEPHRINE 40 MCG/ML (10ML) SYRINGE FOR IV PUSH (FOR BLOOD PRESSURE SUPPORT): 80.0000 ug | PREFILLED_SYRINGE | INTRAVENOUS | Status: DC | PRN

## 2013-07-16 MED ORDER — OXYCODONE-ACETAMINOPHEN 5-325 MG PO TABS: 1.0000 | ORAL_TABLET | ORAL | Status: DC | PRN

## 2013-07-16 MED ORDER — ONDANSETRON HCL 4 MG/2ML IJ SOLN: 4.0000 mg | Freq: Four times a day (QID) | INTRAMUSCULAR | Status: DC | PRN

## 2013-07-16 MED ORDER — PHENYLEPHRINE 40 MCG/ML (10ML) SYRINGE FOR IV PUSH (FOR BLOOD PRESSURE SUPPORT)
80.0000 ug | PREFILLED_SYRINGE | INTRAVENOUS | Status: DC | PRN
  Filled 2013-07-16: qty 5

## 2013-07-16 MED ORDER — CITRIC ACID-SODIUM CITRATE 334-500 MG/5ML PO SOLN: 30.0000 mL | ORAL | Status: DC | PRN

## 2013-07-16 MED ORDER — PENICILLIN G POTASSIUM 5000000 UNITS IJ SOLR
5.0000 10*6.[IU] | Freq: Once | INTRAVENOUS | Status: AC
  Administered 2013-07-16: 5 10*6.[IU] via INTRAVENOUS
  Filled 2013-07-16: qty 5

## 2013-07-16 MED ORDER — LACTATED RINGERS IV SOLN
INTRAVENOUS | Status: DC
  Administered 2013-07-16: 500 mL via INTRAVENOUS
  Administered 2013-07-16 (×2): via INTRAVENOUS
  Administered 2013-07-16: 125 mL/h via INTRAVENOUS

## 2013-07-16 MED ORDER — EPHEDRINE 5 MG/ML INJ: 10.0000 mg | INTRAVENOUS | Status: DC | PRN

## 2013-07-16 MED ORDER — ACETAMINOPHEN 325 MG PO TABS: 650.0000 mg | ORAL_TABLET | ORAL | Status: DC | PRN

## 2013-07-16 MED ORDER — PENICILLIN G POTASSIUM 5000000 UNITS IJ SOLR
2.5000 10*6.[IU] | INTRAVENOUS | Status: DC
  Administered 2013-07-16 (×2): 2.5 10*6.[IU] via INTRAVENOUS
  Filled 2013-07-16 (×7): qty 2.5

## 2013-07-16 MED ORDER — LIDOCAINE HCL (PF) 1 % IJ SOLN
INTRAMUSCULAR | Status: DC | PRN
  Administered 2013-07-16 (×2): 9 mL

## 2013-07-16 MED ORDER — EPHEDRINE 5 MG/ML INJ
10.0000 mg | INTRAVENOUS | Status: DC | PRN
  Filled 2013-07-16: qty 4

## 2013-07-16 MED ORDER — LIDOCAINE HCL (PF) 1 % IJ SOLN
30.0000 mL | INTRAMUSCULAR | Status: DC | PRN
  Filled 2013-07-16: qty 30

## 2013-07-16 MED ORDER — DIPHENHYDRAMINE HCL 50 MG/ML IJ SOLN
12.5000 mg | INTRAMUSCULAR | Status: DC | PRN
  Administered 2013-07-16 (×2): 12.5 mg via INTRAVENOUS
  Filled 2013-07-16 (×2): qty 1

## 2013-07-16 MED ORDER — OXYTOCIN 40 UNITS IN LACTATED RINGERS INFUSION - SIMPLE MED: 62.5000 mL/h | INTRAVENOUS | Status: DC

## 2013-07-16 MED ORDER — IBUPROFEN 600 MG PO TABS
600.0000 mg | ORAL_TABLET | Freq: Four times a day (QID) | ORAL | Status: DC | PRN
  Administered 2013-07-16: 600 mg via ORAL
  Filled 2013-07-16: qty 1

## 2013-07-16 MED ORDER — FENTANYL 2.5 MCG/ML BUPIVACAINE 1/10 % EPIDURAL INFUSION (WH - ANES)
14.0000 mL/h | INTRAMUSCULAR | Status: DC | PRN
  Filled 2013-07-16: qty 125

## 2013-07-16 MED ORDER — FENTANYL 2.5 MCG/ML BUPIVACAINE 1/10 % EPIDURAL INFUSION (WH - ANES)
INTRAMUSCULAR | Status: DC | PRN
  Administered 2013-07-16: 14 mL/h via EPIDURAL

## 2013-07-16 NOTE — Anesthesia Procedure Notes (Signed)
Epidural Patient location during procedure: OB Start time: 07/16/2013 4:57 PM End time: 07/16/2013 5:01 PM  Staffing Anesthesiologist: Sandrea Hughs Performed by: anesthesiologist   Preanesthetic Checklist Completed: patient identified, surgical consent, pre-op evaluation, timeout performed, IV checked, risks and benefits discussed and monitors and equipment checked  Epidural Patient position: sitting Prep: site prepped and draped and DuraPrep Patient monitoring: continuous pulse ox and blood pressure Approach: midline Injection technique: LOR air  Needle:  Needle type: Tuohy  Needle gauge: 17 G Needle length: 9 cm and 9 Needle insertion depth: 5 cm cm Catheter type: closed end flexible Catheter size: 19 Gauge Catheter at skin depth: 10 cm Test dose: negative and Other  Assessment Sensory level: T9 Events: blood not aspirated, injection not painful, no injection resistance, negative IV test and no paresthesia  Additional Notes Reason for block:procedure for pain

## 2013-07-16 NOTE — H&P (Signed)
Attestation of Attending Supervision of Advanced Practitioner (CNM/NP): Evaluation and management procedures were performed by the Advanced Practitioner under my supervision and collaboration.  I have reviewed the Advanced Practitioner's note and chart, and I agree with the management and plan.  HARRAWAY-Murdock, [place] Sigmund 10:26 PM

## 2013-07-16 NOTE — Anesthesia Preprocedure Evaluation (Signed)
Anesthesia Evaluation  Patient identified by MRN, date of birth, ID band Patient awake    Reviewed: Allergy & Precautions, H&P , NPO status , Patient's Chart, lab work & pertinent test results  Airway Mallampati: I TM Distance: >3 FB Neck ROM: full    Dental no notable dental hx.    Pulmonary neg pulmonary ROS,    Pulmonary exam normal       Cardiovascular negative cardio ROS      Neuro/Psych negative neurological ROS     GI/Hepatic negative GI ROS, Neg liver ROS,   Endo/Other  negative endocrine ROS  Renal/GU   negative genitourinary   Musculoskeletal   Abdominal Normal abdominal exam  (+)   Peds  Hematology negative hematology ROS (+)   Anesthesia Other Findings   Reproductive/Obstetrics (+) Pregnancy                           Anesthesia Physical Anesthesia Plan  ASA: II  Anesthesia Plan: Epidural   Post-op Pain Management:    Induction:   Airway Management Planned:   Additional Equipment:   Intra-op Plan:   Post-operative Plan:   Informed Consent: I have reviewed the patients History and Physical, chart, labs and discussed the procedure including the risks, benefits and alternatives for the proposed anesthesia with the patient or authorized representative who has indicated his/her understanding and acceptance.     Plan Discussed with:   Anesthesia Plan Comments:         Anesthesia Quick Evaluation

## 2013-07-16 NOTE — MAU Note (Signed)
Pt arrived by EMS with Clear ROM approx. At 0845 this morning.  No vaginal bleeding.  No U/C's.  Good fetal movement.

## 2013-07-16 NOTE — Progress Notes (Signed)
Michelle Greer is a 17 y.o. G1P0000 at [redacted]w[redacted]d admitted for induction of labor due to SROM.  Subjective: Having painful contractions but is comfortable. Will inform nurse when she feels like she would benefit from an epidural.  Objective: BP 131/77  Pulse 80  Temp(Src) 98.1 F (36.7 C) (Oral)  Resp 20  Ht 5\' 4"  (1.626 m)  Wt 74.844 kg (165 lb)  BMI 28.31 kg/m2  SpO2 100%  LMP 10/27/2012      FHT:  FHR: 145 bpm, variability: moderate,  accelerations:  Present,  decelerations:  Absent UC:   regular, every 2-4 minutes SVE:   Dilation: 4 Effacement (%): 70 Station: -2 Exam by:: Raliegh Ip RN  Labs: Lab Results  Component Value Date   WBC 9.2 07/16/2013   HGB 11.5* 07/16/2013   HCT 35.1* 07/16/2013   MCV 80.3 07/16/2013   PLT 266 07/16/2013    Assessment / Plan: Induction of labor due to SROM,  progressing well on pitocin  Labor: Progressing on Pitocin, will continue to increase Preeclampsia:  N/A Fetal Wellbeing:  Category I Pain Control:  Labor support without medications I/D:  GBS positive; Penicillin + 2.67mil q4hrs Anticipated MOD:  NSVD  Jacquelin Hawking 07/16/2013, 4:02 PM

## 2013-07-16 NOTE — Progress Notes (Signed)
LABOR PROGRESS NOTE  Michelle Greer is a 17 y.o. G1P0000 at [redacted]w[redacted]d  admitted for rupture of membranes  Subjective: Pt comfortable with epidural. +FM. No VB. Continued LOF  Objective: BP 132/72  Pulse 82  Temp(Src) 98 F (36.7 C) (Oral)  Resp 18  Ht 5\' 4"  (1.626 m)  Wt 74.844 kg (165 lb)  BMI 28.31 kg/m2  SpO2 100%  LMP 10/27/2012    FHT:  FHR: 135 bpm, variability: moderate,  accelerations:  Present,  decelerations:  Absent UC:   irregular, every 1-4 minutes SVE:   Dilation: 4 Effacement (%): 70 Station: -2 Exam by:: Dr. Reola Calkins Pitocin @ 14 mu/min  Labs: Lab Results  Component Value Date   WBC 9.2 07/16/2013   HGB 11.5* 07/16/2013   HCT 35.1* 07/16/2013   MCV 80.3 07/16/2013   PLT 266 07/16/2013    Assessment / Plan: Spontaneous labor, progressing normally  Labor: in latent labor. irregular contractions. cont to increase pit Fetal Wellbeing:  Category I Pain Control:  Epidural ID: GBS + and on PCN Anticipated MOD:  NSVD  Sharel Behne L, MD 07/16/2013, 6:17 PM

## 2013-07-16 NOTE — H&P (Signed)
Michelle Greer is a 17 y.o. female G1 @ [redacted]w[redacted]d presenting for SROM. Around 8am, she noticed leaking fluid that would not stop. She told her mom who brought her to the hospital. She has not been feeling contractions, and is currently not feeling contractions. Fetal movement has been good. She has no nausea, vomiting, headaches, scatoma or vision changes, RUQ pain, painful or swollen calves. She takes iron only at home. Her last prenatal visit was when she was 29 weeks because of transportation issues.   Maternal Medical History:  Reason for admission: Rupture of membranes.  Nausea.    OB History   Grav Para Term Preterm Abortions TAB SAB Ect Mult Living   1 0 0 0 0 0 0 0 0 0      Past Medical History  Diagnosis Date  . Bronchitis   . Bronchitis, acute   . Depression   . Anemia    Past Surgical History  Procedure Laterality Date  . No past surgeries     Family History: family history includes Asthma in her mother. Social History:  reports that she has quit smoking. She has never used smokeless tobacco. She reports that she does not drink alcohol or use illicit drugs.   Prenatal Transfer Tool  Maternal Diabetes: No Genetic Screening: Normal Maternal Ultrasounds/Referrals: Normal Fetal Ultrasounds or other Referrals:  Referred to Materal Fetal Medicine  for proteinuria without elevated blood pressure  Maternal Substance Abuse:  No Significant Maternal Medications:  None Significant Maternal Lab Results:  None Other Comments:  None  Review of Systems  Constitutional: Negative for fever and chills.  Eyes: Negative for blurred vision.  Respiratory: Negative for shortness of breath.   Gastrointestinal: Positive for constipation. Negative for nausea, vomiting and diarrhea.  Genitourinary: Negative for dysuria.  Neurological: Negative for headaches.    Dilation: 3 Effacement (%): 70 Station: -2 Exam by:: Dr. Reola Calkins Blood pressure 127/80, pulse 119, temperature 98 F (36.7 C),  temperature source Oral, resp. rate 18, height 5\' 4"  (1.626 m), weight 74.844 kg (165 lb), last menstrual period 10/27/2012, SpO2 100.00%.  GEN: NAD, comfortable HEENT: MMM PULM: good effort CV: RRR, normal distal pulses EXT: trace edema   FHT: 135bpm, moderate variability, +accelerations, +early decel, Category 1 UC: Regularly every 3-4 minutes  Exam Physical Exam  Prenatal labs: ABO, Rh: O/POS/-- (02/10 1124) Antibody: NEG (02/10 1124) Rubella: 3.29 (02/10 1124) RPR: NON REAC (06/11 1108)  HBsAg: NEGATIVE (02/10 1124)  HIV: NON REACTIVE (06/11 1108)  GBS: Positive (02/10 0000)   Assessment/Plan: 16yo G1 at [redacted]w[redacted]d presenting spontaneous rupture of membranes admitted for augmentation of labor with recent loss of prenatal care  ADMIT to L&D #Labor: bishop score 7, will start pitocin, routine labor orders #Pain: not currently feeling pain  #FWB: Cat I, reactive and reassuring, continuous monitoring #ID: GBS positive, penicillin units x1 + 2.78mil units q4hours for prophylaxis; test GC/chlamydia #Method of feeding: Bottle/breastfeed #Method of contraception: Unsure of contraception at this point, but does not want depo  She would like a circumcision performed at the hospital.   Jacquelin Hawking, MD 07/16/2013, 10:35 AM  I have seen and examined this patient and agree with above documentation in the resident's note. Pt is a 17 yo G1 @ [redacted]w[redacted]d by LMP c/w 14 week [place] who presented with possible SROM confirmed with gross pooling and +fern.  Admit to L&D, favorable bishops. Will plan to augment with pitocin.  Pt was lost to f/u @ 29 weeks due to losing housing  and transportation. She and her mother are in a hotel currently but have housing established for after baby arrives. Will plan to have SW come and see them while in house also. GBS + from UTI earlier in pregnancy- will treat with PCN.  EFW 7lbs.   Rulon Abide, M.D. [facility] Fellow 07/16/2013 7:14 PM

## 2013-07-17 ENCOUNTER — Encounter: Payer: Self-pay | Admitting: Family Medicine

## 2013-07-17 ENCOUNTER — Encounter (HOSPITAL_COMMUNITY): Payer: Self-pay | Admitting: Family Medicine

## 2013-07-17 LAB — GC/CHLAMYDIA PROBE AMP: GC Probe RNA: NEGATIVE

## 2013-07-17 MED ORDER — ZOLPIDEM TARTRATE 5 MG PO TABS: 5.0000 mg | ORAL_TABLET | Freq: Every evening | ORAL | Status: DC | PRN

## 2013-07-17 MED ORDER — ONDANSETRON HCL 4 MG/2ML IJ SOLN: 4.0000 mg | INTRAMUSCULAR | Status: DC | PRN

## 2013-07-17 MED ORDER — LANOLIN HYDROUS EX OINT: TOPICAL_OINTMENT | CUTANEOUS | Status: DC | PRN

## 2013-07-17 MED ORDER — SENNOSIDES-DOCUSATE SODIUM 8.6-50 MG PO TABS
2.0000 | ORAL_TABLET | Freq: Every day | ORAL | Status: DC
  Administered 2013-07-17: 2 via ORAL

## 2013-07-17 MED ORDER — DIBUCAINE 1 % RE OINT
1.0000 "application " | TOPICAL_OINTMENT | RECTAL | Status: DC | PRN
  Filled 2013-07-17: qty 28

## 2013-07-17 MED ORDER — SIMETHICONE 80 MG PO CHEW: 80.0000 mg | CHEWABLE_TABLET | ORAL | Status: DC | PRN

## 2013-07-17 MED ORDER — TETANUS-DIPHTH-ACELL PERTUSSIS 5-2.5-18.5 LF-MCG/0.5 IM SUSP
0.5000 mL | Freq: Once | INTRAMUSCULAR | Status: AC
  Administered 2013-07-18: 0.5 mL via INTRAMUSCULAR
  Filled 2013-07-17: qty 0.5

## 2013-07-17 MED ORDER — IBUPROFEN 600 MG PO TABS
600.0000 mg | ORAL_TABLET | Freq: Four times a day (QID) | ORAL | Status: DC
  Administered 2013-07-17 – 2013-07-18 (×6): 600 mg via ORAL
  Filled 2013-07-17 (×6): qty 1

## 2013-07-17 MED ORDER — OXYCODONE-ACETAMINOPHEN 5-325 MG PO TABS
1.0000 | ORAL_TABLET | ORAL | Status: DC | PRN
  Administered 2013-07-17 (×2): 2 via ORAL
  Filled 2013-07-17 (×2): qty 2

## 2013-07-17 MED ORDER — DIPHENHYDRAMINE HCL 25 MG PO CAPS: 25.0000 mg | ORAL_CAPSULE | Freq: Four times a day (QID) | ORAL | Status: DC | PRN

## 2013-07-17 MED ORDER — WITCH HAZEL-GLYCERIN EX PADS: 1.0000 "application " | MEDICATED_PAD | CUTANEOUS | Status: DC | PRN

## 2013-07-17 MED ORDER — BENZOCAINE-MENTHOL 20-0.5 % EX AERO
1.0000 "application " | INHALATION_SPRAY | CUTANEOUS | Status: DC | PRN
  Administered 2013-07-17: 1 via TOPICAL
  Filled 2013-07-17 (×2): qty 56

## 2013-07-17 MED ORDER — ONDANSETRON HCL 4 MG PO TABS: 4.0000 mg | ORAL_TABLET | ORAL | Status: DC | PRN

## 2013-07-17 MED ORDER — PRENATAL MULTIVITAMIN CH
1.0000 | ORAL_TABLET | Freq: Every day | ORAL | Status: DC
  Administered 2013-07-17 – 2013-07-18 (×2): 1 via ORAL
  Filled 2013-07-17 (×2): qty 1

## 2013-07-17 NOTE — Anesthesia Postprocedure Evaluation (Signed)
Anesthesia Post Note  Patient: Michelle Greer  Procedure(s) Performed: * No procedures listed *  Anesthesia type: Epidural  Patient location: Mother/Baby  Post pain: Pain level controlled  Post assessment: Post-op Vital signs reviewed  Last Vitals:  Filed Vitals:   07/17/13 0503  BP: 124/68  Pulse: 86  Temp: 36.9 C  Resp: 18    Post vital signs: Reviewed  Level of consciousness:alert  Complications: No apparent anesthesia complications

## 2013-07-17 NOTE — Progress Notes (Signed)
UR chart review completed.  

## 2013-07-17 NOTE — Progress Notes (Signed)
Post Partum Day #1 Subjective: no complaints, up ad lib and tolerating PO; breast and bottle feeding; unsure re contraception- options reviewed  Objective: Blood pressure 124/68, pulse 86, temperature 98.5 F (36.9 C), temperature source Oral, resp. rate 18, height 5\' 4"  (1.626 m), weight 74.844 kg (165 lb), last menstrual period 10/27/2012, SpO2 100.00%, unknown if currently breastfeeding.  Physical Exam:  General: alert, cooperative and no distress Lochia: appropriate Uterine Fundus: firm DVT Evaluation: No evidence of DVT seen on physical exam.   Recent Labs  07/16/13 0955  HGB 11.5*  HCT 35.1*    Assessment/Plan: Plan for discharge tomorrow and Social Work consult (housing issues)   LOS: 1 day   Cam Hai 07/17/2013, 7:45 AM

## 2013-07-18 NOTE — Discharge Summary (Signed)
Obstetric Discharge Summary Reason for Admission: rupture of membranes Prenatal Procedures: NST Intrapartum Procedures: spontaneous vaginal delivery Postpartum Procedures: none Complications-Operative and Postpartum: none Hemoglobin  Date Value Range Status  07/16/2013 11.5* 12.0 - 15.0 g/dL Final     HCT  Date Value Range Status  07/16/2013 35.1* 36.0 - 46.0 % Final    Physical Exam:  General: alert, cooperative, appears stated age and no distress Lochia: appropriate Uterine Fundus: firm U-2 CTAB no w.r.c RRR no mg/t DVT Evaluation: No evidence of DVT seen on physical exam. Negative Homan's sign. No cords or calf tenderness. No significant calf/ankle edema.  Discharge Diagnoses: Term Pregnancy-delivered  Discharge Information: Date: 07/18/2013 MOF: Breast/bottle MOC: Depo/mirena Activity: pelvic rest and 4-6wk Diet: routine Medications: PNV and Ibuprofen Condition: stable Instructions: refer to practice specific booklet Discharge to: home Follow-up Information   Follow up with [facility]. Schedule an appointment as soon as possible for a visit in 5 weeks. (Postpartum follow-up, mirena placement, and Depo shot)    Specialty:  Obstetrics and Gynecology   Contact information:   [address] [place] [address] [phone]      Newborn Data: Live born female  Birth Weight: 6 lb 6.4 oz (2903 g) APGAR: 9, 9  Home with mother.  Tawana Scale 07/18/2013, 11:25 AM

## 2013-07-18 NOTE — Lactation Note (Signed)
This note was copied from the chart of Michelle Dorothey Oetken. Lactation Consultation Note Mom states breast feeding is getting better, baby is latching well, mom c/o slight nipple pain.  Nipples have compression line. Comfort gels provided, with inst for use. Discussed position and latch and strategies to prevent and treat sore nipples and engorgement. Offered to assist with a latch, mom states baby just fed.  Enc mom to call the lactation office if she has any concerns, and to attend the BFSG.  Patient Name: Michelle Greer [id number] Date: 07/18/2013     Maternal Data    Feeding    LATCH Score/Interventions                      Lactation Tools Discussed/Used     Consult Status      Lenard Forth 07/18/2013, 10:46 AM

## 2013-07-19 NOTE — Clinical Social Work Maternal (Signed)
LATE ENTRY FROM 07/18/13:  Clinical Social Work Department PSYCHOSOCIAL ASSESSMENT - MATERNAL/CHILD 07/19/2013  Patient:  Michelle Greer  Account Number:  [id number]  Admit Date:  07/16/2013  Marjo Bicker Name:   Michelle Greer    Clinical Social Worker:  Nobie Putnam, LCSW   Date/Time:  07/18/2013 12:00 N  Date Referred:  07/18/2013   Referral source  CN     Referred reason  Depression/Anxiety  Homelessness   Other referral source:    I:  FAMILY / HOME ENVIRONMENT Child's legal guardian:  PARENT  Guardian - Name Guardian - Age Guardian - Address  Michelle Greer [address] [place], [place] [address]  Michelle Greer 16    Other household support members/support persons Name Relationship DOB  Michelle Greer MOTHER    Other support:   Family    II  PSYCHOSOCIAL DATA Information Source:  Patient Interview  Event organiser Employment:   Financial resources:  Medicaid If Medicaid - County:  [place] Other  [facility]   School / Grade:   Maternity Care Coordinator / Child Services Coordination / Early Interventions:  Cultural issues impacting care:    III  STRENGTHS Strengths  Adequate Resources  Home prepared for Child (including basic supplies)  Supportive family/friends   Strength comment:    IV  RISK FACTORS AND CURRENT PROBLEMS Current Problem:  YES   Risk Factor & Current Problem Patient Issue Family Issue Risk Factor / Current Problem Comment  Mental Illness Y N Hx of depression  Housing Concerns Y N Living at [facility]    V  SOCIAL WORK ASSESSMENT CSW met with pt to assess her history of depression & current housing situation.  Pt has never been diagnosed with depression but admits to feeling depressed majority of the time.  She was not able to articulate a specific reason for depressed moods but said she gets depressed with "stuff don't go right."  Pt's current living conditions seem to be a source of her depression.  She & her mother  have lived [facility] for the past 7 months.  While she is thankful to have housing, she is not happy about taking her newborn to a hotel room.  She is concerned about her newborn being exposed to bugs.  Her mother was at their room cleaning up, preparing for pt & infant to come.  CSW provided pt with a counseling referral to [facility], after she expressed interest in speaking with someone.  Pt is willing to start an anti-depressant "if it will make me a better mom for him."  CSW also referred pt to the [facility], as a resource.  Pt is very soft spoken but seems motivated to accept help & better her situation.  CSW provided pt with a bundle pack of clothing.  Volunteer services provided pt with a car seat, free of charge.  Pt has family that helps her & her mother pay for their hotel room.  CSW discussed signs/symptoms of PP depression & encouraged her to discuss with counselor or seek medical attention.  Pt thanked CSW for resources provided.      VI SOCIAL WORK PLAN Social Work Plan  No Further Intervention Required / No Barriers to Discharge  Information/Referral to [employer]   Type of pt/family education:   [facility]  [employer] [facility]   If child protective services report - county:   If child protective services report - date:  Information/referral to community resources comment:   Other social work plan:

## 2013-07-20 ENCOUNTER — Ambulatory Visit: Payer: Medicaid Other

## 2013-07-31 ENCOUNTER — Inpatient Hospital Stay (HOSPITAL_COMMUNITY)
Admission: AD | Admit: 2013-07-31 | Discharge: 2013-07-31 | Disposition: A | Discharge status: Home / Self Care | Payer: Medicaid Other | Source: Ambulatory Visit | Attending: Family Medicine | Admitting: Family Medicine

## 2013-07-31 ENCOUNTER — Encounter (HOSPITAL_COMMUNITY): Payer: Self-pay | Admitting: *Deleted

## 2013-07-31 DIAGNOSIS — B3731 Acute candidiasis of vulva and vagina: Secondary | ICD-10-CM | POA: Insufficient documentation

## 2013-07-31 DIAGNOSIS — R030 Elevated blood-pressure reading, without diagnosis of hypertension: Secondary | ICD-10-CM | POA: Insufficient documentation

## 2013-07-31 DIAGNOSIS — B373 Candidiasis of vulva and vagina: Secondary | ICD-10-CM | POA: Insufficient documentation

## 2013-07-31 DIAGNOSIS — L293 Anogenital pruritus, unspecified: Secondary | ICD-10-CM | POA: Insufficient documentation

## 2013-07-31 LAB — PROTEIN / CREATININE RATIO, URINE: Protein Creatinine Ratio: 0.07 (ref 0.00–0.15)

## 2013-07-31 LAB — URINALYSIS, ROUTINE W REFLEX MICROSCOPIC
Bilirubin Urine: NEGATIVE
Glucose, UA: NEGATIVE mg/dL
Ketones, ur: NEGATIVE mg/dL
Nitrite: NEGATIVE
Specific Gravity, Urine: 1.015 (ref 1.005–1.030)
pH: 6 (ref 5.0–8.0)

## 2013-07-31 LAB — COMPREHENSIVE METABOLIC PANEL
ALT: 6 U/L (ref 0–35)
AST: 12 U/L (ref 0–37)
Alkaline Phosphatase: 77 U/L (ref 39–117)
CO2: 26 mEq/L (ref 19–32)
Calcium: 9 mg/dL (ref 8.4–10.5)
GFR calc non Af Amer: >90 mL/min (ref >90)
Glucose, Bld: 88 mg/dL (ref 70–99)
Potassium: 4.3 mEq/L (ref 3.5–5.1)
Sodium: 139 mEq/L (ref 135–145)
Total Protein: 6.7 g/dL (ref 6.0–8.3)

## 2013-07-31 LAB — WET PREP, GENITAL: Trich, Wet Prep: NONE SEEN

## 2013-07-31 LAB — CBC
Hemoglobin: 11 g/dL — ABNORMAL LOW (ref 12.0–15.0)
MCH: 25.5 pg — ABNORMAL LOW (ref 26.0–34.0)
Platelets: 534 10*3/uL — ABNORMAL HIGH (ref 150–400)
RBC: 4.31 MIL/uL (ref 3.87–5.11)

## 2013-07-31 LAB — URINE MICROSCOPIC-ADD ON

## 2013-07-31 MED ORDER — NYSTATIN-TRIAMCINOLONE 100000-0.1 UNIT/GM-% EX CREA
TOPICAL_CREAM | Freq: Two times a day (BID) | CUTANEOUS | Status: DC
  Administered 2013-07-31: 23:00:00 via TOPICAL
  Filled 2013-07-31: qty 15

## 2013-07-31 MED ORDER — HYDROCHLOROTHIAZIDE 25 MG PO TABS
25.0000 mg | ORAL_TABLET | Freq: Once | ORAL | Status: AC
  Administered 2013-07-31: 25 mg via ORAL
  Filled 2013-07-31: qty 1

## 2013-07-31 MED ORDER — NYSTATIN-TRIAMCINOLONE 100000-0.1 UNIT/GM-% EX CREA: TOPICAL_CREAM | Freq: Two times a day (BID) | CUTANEOUS | Status: DC

## 2013-07-31 MED ORDER — FLUCONAZOLE 150 MG PO TABS
150.0000 mg | ORAL_TABLET | Freq: Once | ORAL | Status: AC
  Administered 2013-07-31: 150 mg via ORAL
  Filled 2013-07-31: qty 1

## 2013-07-31 MED ORDER — HYDROCHLOROTHIAZIDE 12.5 MG PO CAPS: 25.0000 mg | ORAL_CAPSULE | ORAL | Status: DC

## 2013-07-31 NOTE — MAU Provider Note (Signed)
History     CSN: [id number]  Arrival date and time: 07/31/13 7829   First Provider Initiated Contact with Patient 07/31/13 2031      Chief Complaint  Patient presents with  . Vaginal Itching  . Vaginal Discharge   HPI Comments: Michelle Greer presents with history of vaginal itching since delivery of her baby. There are no alleviating or aggravating factors. There is no associated odor. She has not been engaging in coitus. She still has mild bleeding and has noticed white discharge. She has had no fever. While in the MAU, she exhibited multiple readings of elevated blood pressure. She had been worked up for [facility] during pregnancy but not diagnosed. She is not having any symptoms suggesting PIH.   OB History   Grav Para Term Preterm Abortions TAB SAB Ect Mult Living   1 1 1  0 0 0 0 0 0 1      Past Medical History  Diagnosis Date  . Bronchitis   . Bronchitis, acute   . Depression   . Anemia     Past Surgical History  Procedure Laterality Date  . No past surgeries      Family History  Problem Relation Age of Onset  . Asthma Mother     History  Substance Use Topics  . Smoking status: Former Games developer  . Smokeless tobacco: Never Used  . Alcohol Use: No    Allergies:  Allergies  Allergen Reactions  . Tomato Hives    Prescriptions prior to admission  Medication Sig Dispense Refill  . Prenatal Vit-Fe Fumarate-FA (PRENATAL MULTIVITAMIN) TABS Take 1 tablet by mouth every morning.      . [DISCONTINUED] ferrous sulfate 325 (65 FE) MG tablet Take 1 tablet (325 mg total) by mouth daily with breakfast.  30 tablet  8    Review of Systems  Constitutional: Negative for fever and chills.  HENT:       Has rare headaches  Eyes: Negative for blurred vision, double vision and photophobia.  Gastrointestinal:       Mild middle to left LQ abdominal pain  Genitourinary: Negative for dysuria and urgency.  Neurological: Negative for seizures.       Tingling of left anterior thigh down  to foot when crosses leg   Physical Exam   Blood pressure 155/102, pulse 63, temperature 99.3 F (37.4 C), temperature source Oral, resp. rate 18, last menstrual period 10/27/2012, SpO2 100.00%, currently breastfeeding.  Physical Exam  Constitutional: She is oriented to person, place, and time. She appears well-developed and well-nourished. No distress.  Cardiovascular: Normal rate, regular rhythm, normal heart sounds and intact distal pulses.   No murmur heard. Respiratory: Breath sounds normal. No respiratory distress. She has no wheezes.  GI: Soft. Bowel sounds are normal. She exhibits no distension. There is no tenderness. There is no rebound and no guarding.  Genitourinary: Pelvic exam was performed with patient supine. There is no rash, tenderness, lesion or injury on the right labia. There is no rash, tenderness, lesion or injury on the left labia. No erythema, tenderness or bleeding around the vagina. No foreign body around the vagina. No signs of injury around the vagina. No vaginal discharge found.  Minimal bleeding from cervical os  Musculoskeletal: Normal range of motion. She exhibits edema. She exhibits no tenderness.  Neurological: She is alert and oriented to person, place, and time. She has normal reflexes.  Skin: Skin is warm and dry. She is not diaphoretic. No erythema.    MAU  Course  Procedures  MDM - speculum exam performed with no signs of tears. Cervix visualized with minimal blood discharge from os. No other discharge noted. No foul odors appreciated. Wet prep obtained.  - blood pressure ranging from 140s-160s/90s-100s since she has been in the MAU. CBC, urinalysis and Cmet ordered  Urinalysis    Component Value Date/Time   COLORURINE YELLOW 07/31/2013 1958   APPEARANCEUR CLEAR 07/31/2013 1958   LABSPEC 1.015 07/31/2013 1958   PHURINE 6.0 07/31/2013 1958   GLUCOSEU NEGATIVE 07/31/2013 1958   HGBUR MODERATE* 07/31/2013 1958   BILIRUBINUR NEGATIVE 07/31/2013 1958    KETONESUR NEGATIVE 07/31/2013 1958   PROTEINUR NEGATIVE 07/31/2013 1958   UROBILINOGEN 0.2 07/31/2013 1958   NITRITE NEGATIVE 07/31/2013 1958   LEUKOCYTESUR TRACE* 07/31/2013 1958   CMP     Component Value Date/Time   NA 139 07/31/2013 2028   K 4.3 07/31/2013 2028   CL 105 07/31/2013 2028   CO2 26 07/31/2013 2028   GLUCOSE 88 07/31/2013 2028   BUN 4* 07/31/2013 2028   CREATININE 0.89 07/31/2013 2028   CREATININE 0.67 05/07/2013 1324   CREATININE 0.67 05/07/2013 1320   CALCIUM 9.0 07/31/2013 2028   PROT 6.7 07/31/2013 2028   ALBUMIN 3.2* 07/31/2013 2028   AST 12 07/31/2013 2028   ALT 6 07/31/2013 2028   ALKPHOS 77 07/31/2013 2028   BILITOT 0.2* 07/31/2013 2028   GFRNONAA >90 07/31/2013 2028   GFRAA >90 07/31/2013 2028   Microscopic wet-mount exam shows no yeast, no trich, few clue cells, few WBCs.   Assessment and Plan   #Clinical dx of vulvar candida - fluconazole 150mg  PO x1 - nystatin-triamcinolone cream applied to vagina BID for itching  #Elevated blood pressure - asymptomatic. Labs do not suggest PIH (no protein in UA, no low platelets, liver enzymes not elevated). She is stable - HCTZ 25mg  QD - follow-up in clinic next week to recheck blood pressure  Jacquelin Hawking, MD 07/31/2013, 9:50 PM   I was present for the exam and agree with above. Dr. Shawnie Pons consulted. Agrees with plan of care. Preeclampsia precautions reviewed.  [place], CNM 08/01/2013 6:11 AM

## 2013-07-31 NOTE — MAU Note (Signed)
Vaginal irritation & white discharge since SVD 8/25. Denies vaginal tears with delivery. Reports vaginal swelling.

## 2013-08-01 NOTE — MAU Provider Note (Signed)
Chart reviewed and agree with management and plan.  

## 2013-08-29 ENCOUNTER — Ambulatory Visit: Payer: Medicaid Other | Admitting: Family Medicine

## 2013-10-22 ENCOUNTER — Ambulatory Visit: Payer: Medicaid Other | Admitting: Obstetrics & Gynecology

## 2014-09-23 ENCOUNTER — Encounter (HOSPITAL_COMMUNITY): Payer: Self-pay | Admitting: *Deleted

## 2014-11-22 DIAGNOSIS — A599 Trichomoniasis, unspecified: Secondary | ICD-10-CM

## 2014-11-22 HISTORY — DX: Trichomoniasis, unspecified: A59.9

## 2015-11-02 ENCOUNTER — Encounter (HOSPITAL_COMMUNITY): Payer: Self-pay | Admitting: Nurse Practitioner

## 2015-11-02 ENCOUNTER — Emergency Department (HOSPITAL_COMMUNITY)
Admission: EM | Admit: 2015-11-02 | Discharge: 2015-11-02 | Disposition: A | Discharge status: Home / Self Care | Payer: Self-pay | Attending: Emergency Medicine | Admitting: Emergency Medicine

## 2015-11-02 DIAGNOSIS — Z792 Long term (current) use of antibiotics: Secondary | ICD-10-CM | POA: Insufficient documentation

## 2015-11-02 DIAGNOSIS — M25551 Pain in right hip: Secondary | ICD-10-CM | POA: Insufficient documentation

## 2015-11-02 DIAGNOSIS — Z793 Long term (current) use of hormonal contraceptives: Secondary | ICD-10-CM | POA: Insufficient documentation

## 2015-11-02 DIAGNOSIS — Z8659 Personal history of other mental and behavioral disorders: Secondary | ICD-10-CM | POA: Insufficient documentation

## 2015-11-02 DIAGNOSIS — Z3202 Encounter for pregnancy test, result negative: Secondary | ICD-10-CM | POA: Insufficient documentation

## 2015-11-02 DIAGNOSIS — Z79899 Other long term (current) drug therapy: Secondary | ICD-10-CM | POA: Insufficient documentation

## 2015-11-02 DIAGNOSIS — N760 Acute vaginitis: Secondary | ICD-10-CM | POA: Insufficient documentation

## 2015-11-02 DIAGNOSIS — Z87891 Personal history of nicotine dependence: Secondary | ICD-10-CM | POA: Insufficient documentation

## 2015-11-02 DIAGNOSIS — Z8619 Personal history of other infectious and parasitic diseases: Secondary | ICD-10-CM | POA: Insufficient documentation

## 2015-11-02 DIAGNOSIS — Z862 Personal history of diseases of the blood and blood-forming organs and certain disorders involving the immune mechanism: Secondary | ICD-10-CM | POA: Insufficient documentation

## 2015-11-02 DIAGNOSIS — B9689 Other specified bacterial agents as the cause of diseases classified elsewhere: Secondary | ICD-10-CM

## 2015-11-02 DIAGNOSIS — Z8709 Personal history of other diseases of the respiratory system: Secondary | ICD-10-CM | POA: Insufficient documentation

## 2015-11-02 HISTORY — DX: Irregular menstruation, unspecified: N92.6

## 2015-11-02 HISTORY — DX: Trichomoniasis, unspecified: A59.9

## 2015-11-02 LAB — URINALYSIS, ROUTINE W REFLEX MICROSCOPIC
Bilirubin Urine: NEGATIVE
GLUCOSE, UA: NEGATIVE mg/dL
HGB URINE DIPSTICK: NEGATIVE
KETONES UR: NEGATIVE mg/dL
Leukocytes, UA: NEGATIVE
Nitrite: NEGATIVE
PROTEIN: NEGATIVE mg/dL
Specific Gravity, Urine: 1.025 (ref 1.005–1.030)
pH: 7 (ref 5.0–8.0)

## 2015-11-02 LAB — WET PREP, GENITAL
Sperm: NONE SEEN
Trich, Wet Prep: NONE SEEN
Yeast Wet Prep HPF POC: NONE SEEN

## 2015-11-02 LAB — POC URINE PREG, ED: PREG TEST UR: NEGATIVE

## 2015-11-02 MED ORDER — METRONIDAZOLE 500 MG PO TABS: 500.0000 mg | ORAL_TABLET | Freq: Two times a day (BID) | ORAL | Status: DC

## 2015-11-02 NOTE — ED Notes (Signed)
PT DISCHARGED. INSTRUCTIONS AND PRESCRIPTION GIVEN. AAOX3. PT IN NO APPARENT DISTRESS OR PAIN. THE OPPORTUNITY TO ASK QUESTIONS WAS PROVIDED. 

## 2015-11-02 NOTE — ED Provider Notes (Signed)
CSN: [id number]     Arrival date & time 11/02/15  1744 History   First MD Initiated Contact with Patient 11/02/15 1905     Chief Complaint  Patient presents with  . Vaginal Discharge  . Leg Pain    right     HPI   19 year old female presents with numerous complaints. Patient reports that for the last 2 years after giving birth to her child she's had right hip flexor pain worse with flexion of the hip, no pain at baseline. She describes this as sharp with hip flexion. Unchanged since initial presentation. No loss of distal sensation strength or motor function. He relates without difficulty. Patient additionally reports that over the last 5 days she's had "creamy white" vaginal discharge, small amounts with no odor. She denies any changes in color clarity or care strips of her urine, no vaginal itching or burning, bleeding. She states that she has not been sexually active for the last 2 months, after having a trichomoniasis infection requiring antibiotics. She denies any abdominal pain, fever, chills, nausea, vomiting, or any other concerning signs or symptoms.   Past Medical History  Diagnosis Date  . Bronchitis   . Bronchitis, acute   . Depression   . Anemia   . Trichomonosis 2016  . Irregular menstrual bleeding    Past Surgical History  Procedure Laterality Date  . No past surgeries     Family History  Problem Relation Age of Onset  . Asthma Mother    Social History  Substance Use Topics  . Smoking status: Former Research scientist (life sciences)  . Smokeless tobacco: Never Used  . Alcohol Use: No   OB History    Gravida Para Term Preterm AB TAB SAB Ectopic Multiple Living   1 1 1  0 0 0 0 0 0 1     Review of Systems  All other systems reviewed and are negative.   Allergies  Tomato  Home Medications   Prior to Admission medications   Medication Sig Start Date End Date Taking? Authorizing Provider  ibuprofen (ADVIL,MOTRIN) 200 MG tablet Take 200 mg by mouth every 6 (six) hours as needed  for headache (For headache or back ache).   Yes Historical Provider, MD  MedroxyPROGESTERone Acetate (DEPO-PROVERA IM) Inject 1 Dose into the muscle.   Yes Historical Provider, MD  hydrochlorothiazide (MICROZIDE) 12.5 MG capsule Take 2 capsules (25 mg total) by mouth every morning. Patient not taking: Reported on 11/02/2015 07/31/13   Mariel Aloe, MD  metroNIDAZOLE (FLAGYL) 500 MG tablet Take 1 tablet (500 mg total) by mouth 2 (two) times daily. 11/02/15   Okey Regal, PA-C  nystatin-triamcinolone (MYCOLOG II) cream Apply topically 2 (two) times daily. Patient not taking: Reported on 11/02/2015 07/31/13   Mariel Aloe, MD  Prenatal Vit-Fe Fumarate-FA (PRENATAL MULTIVITAMIN) TABS Take 1 tablet by mouth every morning.    Historical Provider, MD   BP 135/77 mmHg  Pulse 85  Temp(Src) 98.6 F (37 C) (Oral)  Resp 18  Ht 5\' 7"  (1.702 m)  Wt 64.864 kg  BMI 22.39 kg/m2  SpO2 100%  LMP 09/30/2015 (Approximate)  Breastfeeding? No   Physical Exam  Constitutional: She is oriented to person, place, and time. She appears well-developed and well-nourished.  HENT:  Head: Normocephalic and atraumatic.  Eyes: Conjunctivae are normal. Pupils are equal, round, and reactive to light. Right eye exhibits no discharge. Left eye exhibits no discharge. No scleral icterus.  Neck: Normal range of motion. No JVD present. No tracheal  deviation present.  Pulmonary/Chest: Effort normal. No stridor.  Abdominal: Soft. She exhibits no distension and no mass. There is no tenderness. There is no rebound and no guarding.  Genitourinary: Cervix exhibits no motion tenderness, no discharge and no friability. Right adnexum displays no mass, no tenderness and no fullness. Left adnexum displays no mass, no tenderness and no fullness. No erythema, tenderness or bleeding in the vagina. No foreign body around the vagina. No signs of injury around the vagina. Vaginal discharge found.  White discharge, nonodorous  Musculoskeletal:  Normal range of motion. She exhibits no edema or tenderness.  Neurological: She is alert and oriented to person, place, and time. Coordination normal.  Skin: Skin is warm and dry. No rash noted. No erythema. No pallor.  Psychiatric: She has a normal mood and affect. Her behavior is normal. Judgment and thought content normal.  Nursing note and vitals reviewed.     ED Course  Procedures (including critical care time) Labs Review Labs Reviewed  WET PREP, GENITAL - Abnormal; Notable for the following:    Clue Cells Wet Prep HPF POC PRESENT (*)    WBC, Wet Prep HPF POC MANY (*)    All other components within normal limits  URINALYSIS, ROUTINE W REFLEX MICROSCOPIC (NOT AT [facility])  RPR  HIV ANTIBODY (ROUTINE TESTING)  POC URINE PREG, ED  GC/CHLAMYDIA PROBE AMP ([place]) NOT AT [facility]    Imaging Review No results found. I have personally reviewed and evaluated these images and lab results as part of my medical decision-making.   EKG Interpretation None      MDM   Final diagnoses:  BV (bacterial vaginosis)    Labs: Point-of-care urine pregnancy negative, wet prep, RPR, HIV, urinalysis, point-of-care urine pregnant  Imaging:  Consults:  Therapeutics:  Discharge Meds:  Metronidazole  Assessment/Plan: Patient's presentation most consistent with bacterial vaginosis and musculoskeletal hip pain. No other findings on exam that would necessitate further evaluation and management here in the ED setting. Patient discharged home with above medications and follow-up precautions.       Okey Regal, PA-C 11/03/15 [id number]  Gareth Morgan, MD 11/03/15 [phone]

## 2015-11-02 NOTE — Discharge Instructions (Signed)

## 2015-11-02 NOTE — ED Notes (Signed)
Patient presents today with complaints of vaginal discharge that started 5 days ago and persists today. Discharge is creamy white, no odor. denies itching, burning, bleeding. Was diagnosed with trichomoniasis 2 months ago. Last menstrual period was 09/30/2015, has irregular menstrual cycles. Also complains of right leg pain that she describes at shooting pain at time. Began two years ago after she had an epidural with the vaginal delivery of her child.

## 2015-11-02 NOTE — ED Notes (Signed)
INITIAL ASSESSMENT COMPLETED. PT C/O CREAMY-WHITE VAGINAL DISCHARGE X5 DAYS. PT STATES SHE FEELS PRESSURE TO THE RLQ WHEN URINATING. AWAITING FURTHER ORDERS.

## 2015-11-03 LAB — RPR: RPR Ser Ql: NONREACTIVE

## 2015-11-03 LAB — GC/CHLAMYDIA PROBE AMP (~~LOC~~) NOT AT ARMC
Chlamydia: NEGATIVE
Neisseria Gonorrhea: NEGATIVE

## 2015-11-03 LAB — HIV ANTIBODY (ROUTINE TESTING W REFLEX): HIV Screen 4th Generation wRfx: NONREACTIVE

## 2016-06-23 ENCOUNTER — Encounter (HOSPITAL_COMMUNITY): Payer: Self-pay | Admitting: Emergency Medicine

## 2016-06-23 ENCOUNTER — Ambulatory Visit (HOSPITAL_COMMUNITY)
Admission: EM | Admit: 2016-06-23 | Discharge: 2016-06-23 | Disposition: A | Discharge status: Home / Self Care | Payer: Medicaid Other | Attending: Family Medicine | Admitting: Family Medicine

## 2016-06-23 DIAGNOSIS — N898 Other specified noninflammatory disorders of vagina: Secondary | ICD-10-CM | POA: Insufficient documentation

## 2016-06-23 DIAGNOSIS — Z87891 Personal history of nicotine dependence: Secondary | ICD-10-CM | POA: Diagnosis not present

## 2016-06-23 DIAGNOSIS — N390 Urinary tract infection, site not specified: Secondary | ICD-10-CM | POA: Insufficient documentation

## 2016-06-23 LAB — POCT URINALYSIS DIP (DEVICE)
BILIRUBIN URINE: NEGATIVE
GLUCOSE, UA: NEGATIVE mg/dL
KETONES UR: NEGATIVE mg/dL
Nitrite: POSITIVE — AB
PROTEIN: 30 mg/dL — AB
Specific Gravity, Urine: 1.015 (ref 1.005–1.030)
Urobilinogen, UA: 1 mg/dL (ref 0.0–1.0)
pH: 7 (ref 5.0–8.0)

## 2016-06-23 LAB — POCT PREGNANCY, URINE: PREG TEST UR: NEGATIVE

## 2016-06-23 MED ORDER — SULFAMETHOXAZOLE-TRIMETHOPRIM 800-160 MG PO TABS: 1.0000 | ORAL_TABLET | Freq: Two times a day (BID) | ORAL | 0 refills | Status: AC

## 2016-06-23 MED ORDER — CEFTRIAXONE SODIUM 250 MG IJ SOLR
INTRAMUSCULAR | Status: AC
  Filled 2016-06-23: qty 250

## 2016-06-23 MED ORDER — AZITHROMYCIN 250 MG PO TABS
1000.0000 mg | ORAL_TABLET | Freq: Once | ORAL | Status: AC
Start: 2016-06-23 — End: 2016-06-23
  Administered 2016-06-23: 1000 mg via ORAL

## 2016-06-23 MED ORDER — CEFTRIAXONE SODIUM 250 MG IJ SOLR
250.0000 mg | Freq: Once | INTRAMUSCULAR | Status: AC
  Administered 2016-06-23: 250 mg via INTRAMUSCULAR

## 2016-06-23 MED ORDER — AZITHROMYCIN 250 MG PO TABS
ORAL_TABLET | ORAL | Status: AC
  Filled 2016-06-23: qty 4

## 2016-06-23 MED ORDER — LIDOCAINE HCL (PF) 1 % IJ SOLN
INTRAMUSCULAR | Status: AC
  Filled 2016-06-23: qty 2

## 2016-06-23 NOTE — ED Provider Notes (Signed)
CSN: [id number]     Arrival date & time 06/23/16  1009 History   First MD Initiated Contact with Patient 06/23/16 1045     Chief Complaint  Patient presents with  . Vaginal Discharge   (Consider location/radiation/quality/duration/timing/severity/associated sxs/prior Treatment) Michelle Greer is a 20 year old female, presents today for 1-week history of odorous yellow discharge. She describes her discharge as in large amount. She is sexually active with one partner. She denies vaginal itchiness, irritation or burning. She also denies urinating symptoms including dysuria, frequency or urgency. She also denies abdominal pain.    The history is provided by the patient.  Vaginal Discharge  Associated symptoms: no dyspareunia, no dysuria and no fever     Past Medical History:  Diagnosis Date  . Anemia   . Bronchitis   . Bronchitis, acute   . Depression   . Irregular menstrual bleeding   . Trichomonosis 2016   Past Surgical History:  Procedure Laterality Date  . NO PAST SURGERIES     Family History  Problem Relation Age of Onset  . Asthma Mother    Social History  Substance Use Topics  . Smoking status: Former Research scientist (life sciences)  . Smokeless tobacco: Never Used  . Alcohol use No   OB History    Gravida Para Term Preterm AB Living   1 1 1  0 0 1   SAB TAB Ectopic Multiple Live Births   0 0 0 0       Review of Systems  Constitutional: Negative for fatigue and fever.  Respiratory: Negative for shortness of breath and wheezing.   Cardiovascular: Negative for chest pain and palpitations.  Genitourinary: Positive for vaginal discharge. Negative for difficulty urinating, dyspareunia, dysuria, flank pain and vaginal pain.    Allergies  Tomato  Home Medications   Prior to Admission medications   Medication Sig Start Date End Date Taking? Authorizing Provider  hydrochlorothiazide (MICROZIDE) 12.5 MG capsule Take 2 capsules (25 mg total) by mouth every morning. Patient not taking: Reported  on 11/02/2015 07/31/13   Mariel Aloe, MD  ibuprofen (ADVIL,MOTRIN) 200 MG tablet Take 200 mg by mouth every 6 (six) hours as needed for headache (For headache or back ache).    Historical Provider, MD  MedroxyPROGESTERone Acetate (DEPO-PROVERA IM) Inject 1 Dose into the muscle.    Historical Provider, MD  metroNIDAZOLE (FLAGYL) 500 MG tablet Take 1 tablet (500 mg total) by mouth 2 (two) times daily. 11/02/15   Okey Regal, PA-C  nystatin-triamcinolone (MYCOLOG II) cream Apply topically 2 (two) times daily. Patient not taking: Reported on 11/02/2015 07/31/13   Mariel Aloe, MD  Prenatal Vit-Fe Fumarate-FA (PRENATAL MULTIVITAMIN) TABS Take 1 tablet by mouth every morning.    Historical Provider, MD  sulfamethoxazole-trimethoprim (BACTRIM DS,SEPTRA DS) 800-160 MG tablet Take 1 tablet by mouth 2 (two) times daily. 06/23/16 06/26/16  Billy Fischer, MD   Meds Ordered and Administered this Visit   Medications  cefTRIAXone (ROCEPHIN) injection 250 mg (250 mg Intramuscular Given 06/23/16 1149)  azithromycin (ZITHROMAX) tablet 1,000 mg (1,000 mg Oral Given 06/23/16 1147)    BP 132/71 (BP Location: Right Arm)   Pulse 84   Temp 98.3 F (36.8 C) (Oral)   Resp 18   LMP 05/27/2016 (Exact Date)   SpO2 97%  No data found.   Physical Exam  Constitutional: She appears well-developed and well-nourished.  Cardiovascular: Normal rate, regular rhythm and normal heart sounds.   Pulmonary/Chest: Effort normal and breath sounds normal.  Abdominal: Soft.  Bowel sounds are normal. There is no tenderness.  Genitourinary: Vaginal discharge found.  Genitourinary Comments: Large amount of odorous yellow discharge present. No lesion noted on external genitalia, and no lesion noted on cervix and vaginal canal. Negative cervical motion tenderness     Urgent Care Course   Clinical Course    Procedures (including critical care time)  Labs Review Labs Reviewed  POCT URINALYSIS DIP (DEVICE) - Abnormal; Notable for  the following:       Result Value   Hgb urine dipstick TRACE (*)    Protein, ur 30 (*)    Nitrite POSITIVE (*)    Leukocytes, UA LARGE (*)    All other components within normal limits  URINE CULTURE  POCT PREGNANCY, URINE  CERVICOVAGINAL ANCILLARY ONLY    Imaging Review No results found.   Visual Acuity Review  Right Eye Distance:   Left Eye Distance:   Bilateral Distance:    Right Eye Near:   Left Eye Near:    Bilateral Near:         MDM   1. Vaginal discharge   2. UTI (lower urinary tract infection)    Michelle Greer is a 21 years old female presents today for vaginal discharge. There is a concern for STD. Specimen obtained and send to lab. Patient treated presumptively today with azithromycin and rocephin. Her UA was also positive for nitrite and leukocytes; prescription given for bactrim.      Barry Dienes, NP 06/23/16 1511

## 2016-06-23 NOTE — ED Triage Notes (Signed)
The patient presented to the [facility] with a complaint of a vaginal discharge, lower back pain and a a strong odor to her urine for 1 week.

## 2016-06-24 LAB — CERVICOVAGINAL ANCILLARY ONLY
CHLAMYDIA, DNA PROBE: POSITIVE — AB
Neisseria Gonorrhea: POSITIVE — AB
Wet Prep (BD Affirm): NEGATIVE

## 2016-06-25 LAB — URINE CULTURE

## 2016-06-26 ENCOUNTER — Encounter (HOSPITAL_COMMUNITY): Payer: Self-pay | Admitting: Emergency Medicine

## 2016-06-26 ENCOUNTER — Ambulatory Visit (HOSPITAL_COMMUNITY)
Admission: EM | Admit: 2016-06-26 | Discharge: 2016-06-26 | Disposition: A | Discharge status: Home / Self Care | Payer: Medicaid Other | Attending: Emergency Medicine | Admitting: Emergency Medicine

## 2016-06-26 DIAGNOSIS — A5400 Gonococcal infection of lower genitourinary tract, unspecified: Secondary | ICD-10-CM | POA: Diagnosis not present

## 2016-06-26 DIAGNOSIS — A64 Unspecified sexually transmitted disease: Secondary | ICD-10-CM | POA: Diagnosis present

## 2016-06-26 DIAGNOSIS — A749 Chlamydial infection, unspecified: Secondary | ICD-10-CM

## 2016-06-26 DIAGNOSIS — Z87891 Personal history of nicotine dependence: Secondary | ICD-10-CM | POA: Diagnosis not present

## 2016-06-26 MED ORDER — LIDOCAINE HCL (PF) 1 % IJ SOLN
INTRAMUSCULAR | Status: AC
  Filled 2016-06-26: qty 2

## 2016-06-26 MED ORDER — AZITHROMYCIN 250 MG PO TABS
ORAL_TABLET | ORAL | Status: AC
  Filled 2016-06-26: qty 4

## 2016-06-26 MED ORDER — CEFTRIAXONE SODIUM 250 MG IJ SOLR
INTRAMUSCULAR | Status: AC
  Filled 2016-06-26: qty 250

## 2016-06-26 MED ORDER — AZITHROMYCIN 250 MG PO TABS
1000.0000 mg | ORAL_TABLET | Freq: Once | ORAL | Status: AC
  Administered 2016-06-26: 1000 mg via ORAL

## 2016-06-26 MED ORDER — CEFTRIAXONE SODIUM 250 MG IJ SOLR
250.0000 mg | Freq: Once | INTRAMUSCULAR | Status: AC
  Administered 2016-06-26: 250 mg via INTRAMUSCULAR

## 2016-06-26 NOTE — ED Triage Notes (Signed)
The patient presented to the [facility] for treatment of an STD that was initially tested on 06/23/2016. The patient stated that she received a phone call today to return for treatment.

## 2016-06-26 NOTE — Discharge Instructions (Signed)
PLEASE ABSTAIN FROM SEXUAL INTERCOURSE FOR 10 (TEN) DAYS

## 2016-06-26 NOTE — ED Provider Notes (Signed)
CSN: [id number]     Arrival date & time 06/26/16  1536 History   First MD Initiated Contact with Patient 06/26/16 1642     Chief Complaint  Patient presents with  . SEXUALLY TRANSMITTED DISEASE   (Consider location/radiation/quality/duration/timing/severity/associated sxs/prior Treatment) HPI Patient is a 20 year old female was diagnosed and treated for gonorrhea and chlamydia she states that she went home after treatment initially had intercourse again with her partner that had not been treated and is now having new discharge. Patient states that she was not properly advised to abstain from sexual intercourse. Patient denies any pain at this time. She states her partner is here now for treatment. Past Medical History:  Diagnosis Date  . Anemia   . Bronchitis   . Bronchitis, acute   . Depression   . Irregular menstrual bleeding   . Trichomonosis 2016   Past Surgical History:  Procedure Laterality Date  . NO PAST SURGERIES     Family History  Problem Relation Age of Onset  . Asthma Mother    Social History  Substance Use Topics  . Smoking status: Former Research scientist (life sciences)  . Smokeless tobacco: Never Used  . Alcohol use No   OB History    Gravida Para Term Preterm AB Living   1 1 1  0 0 1   SAB TAB Ectopic Multiple Live Births   0 0 0 0 1     Review of Systems   Denies: HEADACHE, NAUSEA, ABDOMINAL PAIN, CHEST PAIN, CONGESTION, DYSURIA, SHORTNESS OF BREATH  Allergies  Tomato  Home Medications   Prior to Admission medications   Medication Sig Start Date End Date Taking? Authorizing Provider  hydrochlorothiazide (MICROZIDE) 12.5 MG capsule Take 2 capsules (25 mg total) by mouth every morning. Patient not taking: Reported on 11/02/2015 07/31/13   Mariel Aloe, MD  ibuprofen (ADVIL,MOTRIN) 200 MG tablet Take 200 mg by mouth every 6 (six) hours as needed for headache (For headache or back ache).    Historical Provider, MD  MedroxyPROGESTERone Acetate (DEPO-PROVERA IM) Inject 1 Dose  into the muscle.    Historical Provider, MD  metroNIDAZOLE (FLAGYL) 500 MG tablet Take 1 tablet (500 mg total) by mouth 2 (two) times daily. 11/02/15   Okey Regal, PA-C  nystatin-triamcinolone (MYCOLOG II) cream Apply topically 2 (two) times daily. Patient not taking: Reported on 11/02/2015 07/31/13   Mariel Aloe, MD  Prenatal Vit-Fe Fumarate-FA (PRENATAL MULTIVITAMIN) TABS Take 1 tablet by mouth every morning.    Historical Provider, MD  sulfamethoxazole-trimethoprim (BACTRIM DS,SEPTRA DS) 800-160 MG tablet Take 1 tablet by mouth 2 (two) times daily. 06/23/16 06/26/16  Billy Fischer, MD   Meds Ordered and Administered this Visit   Medications  cefTRIAXone (ROCEPHIN) injection 250 mg (not administered)  azithromycin (ZITHROMAX) tablet 1,000 mg (not administered)    BP 136/81 (BP Location: Right Arm)   Pulse 97   Temp 98.4 F (36.9 C) (Oral)   Resp 18   LMP 05/27/2016 (Exact Date)   SpO2 100%  No data found.   Physical Exam NURSES NOTES AND VITAL SIGNS REVIEWED. CONSTITUTIONAL: Well developed, well nourished, no acute distress HEENT: normocephalic, atraumatic EYES: Conjunctiva normal NECK:normal ROM, supple, no adenopathy PULMONARY:No respiratory distress, normal effort ABDOMINAL: Soft, ND, NT BS+, No CVAT MUSCULOSKELETAL: Normal ROM of all extremities,  SKIN: warm and dry without rash PSYCHIATRIC: Mood and affect, behavior are normal  Urgent Care Course   Clinical Course    Procedures (including critical care time)  Labs Review Labs  Reviewed  HIV ANTIBODY (ROUTINE TESTING)    Imaging Review No results found.   Visual Acuity Review  Right Eye Distance:   Left Eye Distance:   Bilateral Distance:    Right Eye Near:   Left Eye Near:    Bilateral Near:        Second treatment for chlamydia and gonorrhea patient is advised to abstain from sexual course for at least 10 days. MDM   1. STD (female)     Patient is reassured that there are no issues that  require transfer to higher level of care at this time or additional tests. Patient is advised to continue home symptomatic treatment. Patient is advised that if there are new or worsening symptoms to attend the emergency department, contact primary care provider, or return to UC. Instructions of care provided discharged home in stable condition.    THIS NOTE WAS GENERATED USING A VOICE RECOGNITION SOFTWARE PROGRAM. ALL REASONABLE EFFORTS  WERE MADE TO PROOFREAD THIS DOCUMENT FOR ACCURACY.  I have verbally reviewed the discharge instructions with the patient. A printed AVS was given to the patient.  All questions were answered prior to discharge.      Konrad Felix, [place] 06/26/16 [phone]

## 2016-06-27 LAB — HIV ANTIBODY (ROUTINE TESTING W REFLEX): HIV SCREEN 4TH GENERATION: NONREACTIVE

## 2016-07-07 ENCOUNTER — Telehealth (HOSPITAL_COMMUNITY): Payer: Self-pay | Admitting: Emergency Medicine

## 2016-07-07 NOTE — Telephone Encounter (Signed)
Per Dr. Valere Dross,  Notes Recorded by Melony Overly, MD on 06/25/2016 at 3:48 PM EDT The urine cultures shows the bacteria are sensitive to Bactrim. Please complete the course of Bactrim given at your [facility] visit. Follow up as needed. Result note copied to mychart. ------  Notes Recorded by Melony Overly, MD on 06/24/2016 at 3:20 PM EDT Your tests for gonorrhea and chlamydia are positive. You were adequately treated at your [facility] visit on 8/2. Please notify your sexual partners of the results so they can get tested and treated. Your urine culture is also growing bacteria. Please continue the Bactrim while we wait on the final culture result. Result note copied to patient's mychart. Please notify the health department of positive STD tests.  Called pt and notified of recent lab results from visit 8/2 Pt ID'd properly... Reports feeling better and sx have subsided Also reports she was seen here on 8/5 to get treated again.  Adv pt if sx are not getting better to return and to notify partner(s) Education on safe sex given Faxed documentation to [facility] Pt verb understanding.

## 2016-09-03 ENCOUNTER — Encounter (HOSPITAL_COMMUNITY): Payer: Self-pay

## 2016-09-03 ENCOUNTER — Emergency Department (HOSPITAL_COMMUNITY)
Admission: EM | Admit: 2016-09-03 | Discharge: 2016-09-03 | Disposition: A | Discharge status: Home / Self Care | Payer: Medicaid Other | Attending: Emergency Medicine | Admitting: Emergency Medicine

## 2016-09-03 ENCOUNTER — Emergency Department (HOSPITAL_COMMUNITY): Payer: Medicaid Other

## 2016-09-03 DIAGNOSIS — N760 Acute vaginitis: Secondary | ICD-10-CM | POA: Insufficient documentation

## 2016-09-03 DIAGNOSIS — Z87891 Personal history of nicotine dependence: Secondary | ICD-10-CM | POA: Insufficient documentation

## 2016-09-03 DIAGNOSIS — R102 Pelvic and perineal pain: Secondary | ICD-10-CM

## 2016-09-03 DIAGNOSIS — R109 Unspecified abdominal pain: Secondary | ICD-10-CM | POA: Diagnosis present

## 2016-09-03 DIAGNOSIS — O009 Unspecified ectopic pregnancy without intrauterine pregnancy: Secondary | ICD-10-CM

## 2016-09-03 DIAGNOSIS — B9689 Other specified bacterial agents as the cause of diseases classified elsewhere: Secondary | ICD-10-CM

## 2016-09-03 DIAGNOSIS — Z3201 Encounter for pregnancy test, result positive: Secondary | ICD-10-CM | POA: Diagnosis not present

## 2016-09-03 DIAGNOSIS — Z3A01 Less than 8 weeks gestation of pregnancy: Secondary | ICD-10-CM

## 2016-09-03 LAB — CBC
HCT: 31.1 % — ABNORMAL LOW (ref 36.0–46.0)
Hemoglobin: 9.7 g/dL — ABNORMAL LOW (ref 12.0–15.0)
MCH: 22.1 pg — AB (ref 26.0–34.0)
MCHC: 31.2 g/dL (ref 30.0–36.0)
MCV: 70.8 fL — AB (ref 78.0–100.0)
PLATELETS: 382 10*3/uL (ref 150–400)
RBC: 4.39 MIL/uL (ref 3.87–5.11)
RDW: 19.8 % — AB (ref 11.5–15.5)
WBC: 4.2 10*3/uL (ref 4.0–10.5)

## 2016-09-03 LAB — COMPREHENSIVE METABOLIC PANEL
ALK PHOS: 35 U/L — AB (ref 38–126)
ALT: 11 U/L — AB (ref 14–54)
AST: 17 U/L (ref 15–41)
Albumin: 4 g/dL (ref 3.5–5.0)
Anion gap: 7 (ref 5–15)
CALCIUM: 9.1 mg/dL (ref 8.9–10.3)
CHLORIDE: 104 mmol/L (ref 101–111)
CO2: 23 mmol/L (ref 22–32)
CREATININE: 0.69 mg/dL (ref 0.44–1.00)
GFR calc Af Amer: >60 mL/min (ref >60)
GFR calc non Af Amer: >60 mL/min (ref >60)
GLUCOSE: 123 mg/dL — AB (ref 65–99)
Potassium: 3.9 mmol/L (ref 3.5–5.1)
SODIUM: 134 mmol/L — AB (ref 135–145)
Total Bilirubin: 0.6 mg/dL (ref 0.3–1.2)
Total Protein: 6.9 g/dL (ref 6.5–8.1)

## 2016-09-03 LAB — WET PREP, GENITAL
Sperm: NONE SEEN
TRICH WET PREP: NONE SEEN
Yeast Wet Prep HPF POC: NONE SEEN

## 2016-09-03 LAB — URINALYSIS, ROUTINE W REFLEX MICROSCOPIC
BILIRUBIN URINE: NEGATIVE
GLUCOSE, UA: NEGATIVE mg/dL
HGB URINE DIPSTICK: NEGATIVE
Ketones, ur: 15 mg/dL — AB
Leukocytes, UA: NEGATIVE
Nitrite: NEGATIVE
PROTEIN: NEGATIVE mg/dL
Specific Gravity, Urine: 1.01 (ref 1.005–1.030)
pH: 8 (ref 5.0–8.0)

## 2016-09-03 LAB — I-STAT BETA HCG BLOOD, ED (MC, WL, AP ONLY): I-stat hCG, quantitative: >2000 m[IU]/mL — ABNORMAL HIGH (ref <5)

## 2016-09-03 LAB — ABO/RH: ABO/RH(D): O POS

## 2016-09-03 LAB — LIPASE, BLOOD: LIPASE: 28 U/L (ref 11–51)

## 2016-09-03 LAB — HCG, QUANTITATIVE, PREGNANCY: hCG, Beta Chain, Quant, S: 5971 m[IU]/mL — ABNORMAL HIGH (ref <5)

## 2016-09-03 MED ORDER — AZITHROMYCIN 250 MG PO TABS
1000.0000 mg | ORAL_TABLET | Freq: Once | ORAL | Status: AC
  Administered 2016-09-03: 1000 mg via ORAL
  Filled 2016-09-03: qty 4

## 2016-09-03 MED ORDER — STERILE WATER FOR INJECTION IJ SOLN
INTRAMUSCULAR | Status: AC
  Administered 2016-09-03: 0.9 mL
  Filled 2016-09-03: qty 10

## 2016-09-03 MED ORDER — METRONIDAZOLE 500 MG PO TABS: 500.0000 mg | ORAL_TABLET | Freq: Two times a day (BID) | ORAL | 0 refills | Status: DC

## 2016-09-03 MED ORDER — CEFTRIAXONE SODIUM 250 MG IJ SOLR
250.0000 mg | Freq: Once | INTRAMUSCULAR | Status: AC
  Administered 2016-09-03: 250 mg via INTRAMUSCULAR
  Filled 2016-09-03: qty 250

## 2016-09-03 NOTE — ED Provider Notes (Signed)
[place] DEPT Provider Note   CSN: [id number] Arrival date & time: 09/03/16  [id number]     History   Chief Complaint Chief Complaint  Patient presents with  . Abdominal Pain    HPI Michelle Greer is a 20 y.o. female.Presents today with some pelvic pain more to the right. She states her last menstrual period was September 6. She has had some signs and symptoms of pregnancy. She has been sexually active with 2 partners over the past year. She reports that she has had Trichomonas in the past. She denies any pelvic discharge or signs of infection currently. She has had one pregnancy in the past and has a 15-year-old child. She denies any history of ectopic pregnancy. She denies vaginal bleeding or abnormal discharge. She has not had any urinary tract infection symptoms.  HPI  Past Medical History:  Diagnosis Date  . Anemia   . Bronchitis   . Bronchitis, acute   . Depression   . Irregular menstrual bleeding   . Trichomonosis 2016    Patient Active Problem List   Diagnosis Date Noted  . Proteinuria complicating pregnancy in third trimester 05/14/2013  . Supervision of normal first pregnancy 01/01/2013    Past Surgical History:  Procedure Laterality Date  . NO PAST SURGERIES      OB History    Gravida Para Term Preterm AB Living   1 1 1  0 0 1   SAB TAB Ectopic Multiple Live Births   0 0 0 0 1       Home Medications    Prior to Admission medications   Medication Sig Start Date End Date Taking? Authorizing Provider  Biotin 10 MG CAPS Take 10 mg by mouth daily.   Yes Historical Provider, MD  hydrochlorothiazide (MICROZIDE) 12.5 MG capsule Take 2 capsules (25 mg total) by mouth every morning. Patient not taking: Reported on 09/03/2016 07/31/13   Mariel Aloe, MD  ibuprofen (ADVIL,MOTRIN) 200 MG tablet Take 200 mg by mouth every 6 (six) hours as needed for headache (For headache or back ache).    Historical Provider, MD  MedroxyPROGESTERone Acetate (DEPO-PROVERA IM) Inject  1 Dose into the muscle.    Historical Provider, MD  metroNIDAZOLE (FLAGYL) 500 MG tablet Take 1 tablet (500 mg total) by mouth 2 (two) times daily. Patient not taking: Reported on 09/03/2016 11/02/15   Okey Regal, PA-C  nystatin-triamcinolone (MYCOLOG II) cream Apply topically 2 (two) times daily. Patient not taking: Reported on 11/02/2015 07/31/13   Mariel Aloe, MD  Prenatal Vit-Fe Fumarate-FA (PRENATAL MULTIVITAMIN) TABS Take 1 tablet by mouth every morning.    Historical Provider, MD    Family History Family History  Problem Relation Age of Onset  . Asthma Mother     Social History Social History  Substance Use Topics  . Smoking status: Former Research scientist (life sciences)  . Smokeless tobacco: Never Used  . Alcohol use No     Allergies   Tomato   Review of Systems Review of Systems  All other systems reviewed and are negative.    Physical Exam Updated Vital Signs BP 117/67   Pulse 69   Temp 98.7 F (37.1 C) (Oral)   Resp 18   Ht 5\' 6"  (1.676 m)   LMP 07/28/2016   SpO2 97%   Physical Exam  Constitutional: She is oriented to person, place, and time. She appears well-developed and well-nourished. No distress.  HENT:  Head: Normocephalic and atraumatic.  Right Ear: External ear normal.  Left  Ear: External ear normal.  Nose: Nose normal.  Eyes: Conjunctivae and EOM are normal. Pupils are equal, round, and reactive to light.  Neck: Normal range of motion. Neck supple.  Cardiovascular: Normal rate, regular rhythm and normal heart sounds.   Pulmonary/Chest: Effort normal and breath sounds normal.  Abdominal: Soft. Bowel sounds are normal.  Mild tenderness palpation to the right suprapubic area  Genitourinary: Vaginal discharge found.  Genitourinary Comments: Enlarged uterus nontender. Mild tenderness in the adnexal area on the right  Musculoskeletal: Normal range of motion.  Neurological: She is alert and oriented to person, place, and time. She exhibits normal muscle tone.  Coordination normal.  Skin: Skin is warm and dry.  Psychiatric: She has a normal mood and affect. Her behavior is normal. Thought content normal.  Nursing note and vitals reviewed.    ED Treatments / Results  Labs (all labs ordered are listed, but only abnormal results are displayed) Labs Reviewed  WET PREP, GENITAL - Abnormal; Notable for the following:       Result Value   Clue Cells Wet Prep HPF POC PRESENT (*)    WBC, Wet Prep HPF POC MANY (*)    All other components within normal limits  COMPREHENSIVE METABOLIC PANEL - Abnormal; Notable for the following:    Sodium 134 (*)    Glucose, Bld 123 (*)    BUN <5 (*)    ALT 11 (*)    Alkaline Phosphatase 35 (*)    All other components within normal limits  CBC - Abnormal; Notable for the following:    Hemoglobin 9.7 (*)    HCT 31.1 (*)    MCV 70.8 (*)    MCH 22.1 (*)    RDW 19.8 (*)    All other components within normal limits  URINALYSIS, ROUTINE W REFLEX MICROSCOPIC (NOT AT [facility]) - Abnormal; Notable for the following:    Ketones, ur 15 (*)    All other components within normal limits  HCG, QUANTITATIVE, PREGNANCY - Abnormal; Notable for the following:    hCG, Beta Chain, Quant, S 5,971 (*)    All other components within normal limits  I-STAT BETA HCG BLOOD, ED ([facility], [facility], [facility] ONLY) - Abnormal; Notable for the following:    I-stat hCG, quantitative >2,000.0 (*)    All other components within normal limits  LIPASE, BLOOD  ABO/RH  GC/CHLAMYDIA PROBE AMP ([place]) NOT AT [facility]    EKG  EKG Interpretation None       Radiology US Ob Comp Less 14 Wks  Result Date: 09/03/2016 CLINICAL DATA:  Right pelvic pain for 2 days. Positive pregnancy test. EXAM: OBSTETRIC <14 WK [place] AND TRANSVAGINAL OB US TECHNIQUE: Both transabdominal and transvaginal ultrasound examinations were performed for complete evaluation of the gestation as well as the maternal uterus, adnexal regions, and pelvic cul-de-sac. Transvaginal technique was  performed to assess early pregnancy. COMPARISON:  None. FINDINGS: Intrauterine gestational sac: Visualized. Yolk sac:  Visualized. Embryo:  Not visualized. MSD: 6.4  mm   5 w   2  d Subchorionic hemorrhage:  Small Maternal uterus/adnexae: 2.5 cm complex lesion within the right ovarian parenchyma potentially related to hemorrhagic corpus luteum cyst. Left ovary unremarkable. Trace free fluid noted in the right adnexal region. IMPRESSION: 1. Single intrauterine gestational sac identified with yolk sac visible but no embryo yet apparent. Estimated gestational age by mean sac diameter is 5 week 2 days. Follow-up ultrasound in 7-10 days could be performed to assess for appropriate pregnancy  progression. Electronically Signed   By: Misty Stanley M.D.   On: 09/03/2016 14:54   US Ob Transvaginal  Result Date: 09/03/2016 CLINICAL DATA:  Right pelvic pain for 2 days. Positive pregnancy test. EXAM: OBSTETRIC <14 WK [place] AND TRANSVAGINAL OB US TECHNIQUE: Both transabdominal and transvaginal ultrasound examinations were performed for complete evaluation of the gestation as well as the maternal uterus, adnexal regions, and pelvic cul-de-sac. Transvaginal technique was performed to assess early pregnancy. COMPARISON:  None. FINDINGS: Intrauterine gestational sac: Visualized. Yolk sac:  Visualized. Embryo:  Not visualized. MSD: 6.4  mm   5 w   2  d Subchorionic hemorrhage:  Small Maternal uterus/adnexae: 2.5 cm complex lesion within the right ovarian parenchyma potentially related to hemorrhagic corpus luteum cyst. Left ovary unremarkable. Trace free fluid noted in the right adnexal region. IMPRESSION: 1. Single intrauterine gestational sac identified with yolk sac visible but no embryo yet apparent. Estimated gestational age by mean sac diameter is 5 week 2 days. Follow-up ultrasound in 7-10 days could be performed to assess for appropriate pregnancy progression. Electronically Signed   By: Misty Stanley M.D.   On: 09/03/2016  14:54    Procedures Procedures (including critical care time)  Medications Ordered in ED Medications - No data to display   Initial Impression / Assessment and Plan / ED Course  I have reviewed the triage vital signs and the nursing notes.  Pertinent labs & imaging results that were available during my care of the patient were reviewed by me and considered in my medical decision making (see chart for details).  Clinical Course    20 year old G2 P1 presents today with some pelvic pain and positive pregnancy test. Ultrasound shows yolk sac but no embryo apparent. Plan treat for possible pelvic infections including GC and Chlamydia and bacterial vaginosis. She will need follow-up at [facility] hospital for repeat Quant and ultrasound.  Final Clinical Impressions(s) / ED Diagnoses   Final diagnoses:  Pelvic pain  Less than [redacted] weeks gestation of pregnancy  BV (bacterial vaginosis)    New Prescriptions New Prescriptions   No medications on file     Pattricia Boss, MD 09/03/16 1601

## 2016-09-03 NOTE — ED Triage Notes (Signed)
Per Pt, pt missed her period about a week ago and reports having RLQ pain that started two days ago. Pt had lower back pain with some nausea and vomiting.

## 2016-09-03 NOTE — Discharge Instructions (Signed)
[facility]. Return if worse in interim such as increased pain, bleeding or weakness.

## 2016-09-06 LAB — GC/CHLAMYDIA PROBE AMP (~~LOC~~) NOT AT ARMC
CHLAMYDIA, DNA PROBE: NEGATIVE
NEISSERIA GONORRHEA: NEGATIVE

## 2016-09-10 ENCOUNTER — Encounter (HOSPITAL_COMMUNITY): Payer: Self-pay | Admitting: *Deleted

## 2016-09-10 ENCOUNTER — Emergency Department (HOSPITAL_COMMUNITY)
Admission: EM | Admit: 2016-09-10 | Discharge: 2016-09-10 | Disposition: A | Discharge status: Home / Self Care | Payer: Medicaid Other | Attending: Emergency Medicine | Admitting: Emergency Medicine

## 2016-09-10 ENCOUNTER — Emergency Department (HOSPITAL_COMMUNITY): Payer: Medicaid Other

## 2016-09-10 DIAGNOSIS — O200 Threatened abortion: Secondary | ICD-10-CM

## 2016-09-10 DIAGNOSIS — Z3A01 Less than 8 weeks gestation of pregnancy: Secondary | ICD-10-CM | POA: Diagnosis not present

## 2016-09-10 DIAGNOSIS — Z87891 Personal history of nicotine dependence: Secondary | ICD-10-CM | POA: Diagnosis not present

## 2016-09-10 LAB — CBC WITH DIFFERENTIAL/PLATELET
Basophils Absolute: 0 10*3/uL (ref 0.0–0.1)
Basophils Relative: 1 %
Eosinophils Absolute: 0.3 10*3/uL (ref 0.0–0.7)
Eosinophils Relative: 6 %
HEMATOCRIT: 28.5 % — AB (ref 36.0–46.0)
HEMOGLOBIN: 8.9 g/dL — AB (ref 12.0–15.0)
LYMPHS PCT: 28 %
Lymphs Abs: 1.5 10*3/uL (ref 0.7–4.0)
MCH: 22 pg — ABNORMAL LOW (ref 26.0–34.0)
MCHC: 31.2 g/dL (ref 30.0–36.0)
MCV: 70.5 fL — AB (ref 78.0–100.0)
MONO ABS: 0.4 10*3/uL (ref 0.1–1.0)
MONOS PCT: 8 %
NEUTROS ABS: 3 10*3/uL (ref 1.7–7.7)
NEUTROS PCT: 57 %
Platelets: 446 10*3/uL — ABNORMAL HIGH (ref 150–400)
RBC: 4.04 MIL/uL (ref 3.87–5.11)
RDW: 19.6 % — AB (ref 11.5–15.5)
WBC: 5.3 10*3/uL (ref 4.0–10.5)

## 2016-09-10 LAB — COMPREHENSIVE METABOLIC PANEL
ALBUMIN: 3.6 g/dL (ref 3.5–5.0)
ALT: 9 U/L — ABNORMAL LOW (ref 14–54)
ANION GAP: 7 (ref 5–15)
AST: 14 U/L — AB (ref 15–41)
Alkaline Phosphatase: 31 U/L — ABNORMAL LOW (ref 38–126)
BUN: 5 mg/dL — AB (ref 6–20)
CHLORIDE: 105 mmol/L (ref 101–111)
CO2: 24 mmol/L (ref 22–32)
Calcium: 8.8 mg/dL — ABNORMAL LOW (ref 8.9–10.3)
Creatinine, Ser: 0.58 mg/dL (ref 0.44–1.00)
GFR calc Af Amer: >60 mL/min (ref >60)
GFR calc non Af Amer: >60 mL/min (ref >60)
GLUCOSE: 98 mg/dL (ref 65–99)
POTASSIUM: 3.6 mmol/L (ref 3.5–5.1)
SODIUM: 136 mmol/L (ref 135–145)
Total Bilirubin: 0.3 mg/dL (ref 0.3–1.2)
Total Protein: 6.3 g/dL — ABNORMAL LOW (ref 6.5–8.1)

## 2016-09-10 LAB — WET PREP, GENITAL
Clue Cells Wet Prep HPF POC: NONE SEEN
SPERM: NONE SEEN
TRICH WET PREP: NONE SEEN
Yeast Wet Prep HPF POC: NONE SEEN

## 2016-09-10 LAB — LIPASE, BLOOD: Lipase: 32 U/L (ref 11–51)

## 2016-09-10 LAB — HCG, QUANTITATIVE, PREGNANCY: hCG, Beta Chain, Quant, S: 14769 m[IU]/mL — ABNORMAL HIGH (ref <5)

## 2016-09-10 NOTE — ED Provider Notes (Signed)
[facility] DEPT Provider Note   CSN: [id number] Arrival date & time: 09/10/16  [id number]     History   Chief Complaint Chief Complaint  Patient presents with  . Abdominal Pain    HPI Michelle Greer is a 20 y.o. female.  [place] who p/w abdominal pain. The patient presented here on 10/13 with abdominal pain and was diagnosed with early pregnancy. She was instructed to follow-up with OB/GYN but has not yet contacted them. She was given flagyl for BV which she has been taking but reports that her crampy, intermittent abd pain has continued. She denies any associated vaginal discharge, bleeding, or passage of fluid. She has had nausea but no vomiting or diarrhea. She has not taken any medications for her pain.   The history is provided by the patient.  Abdominal Pain      Past Medical History:  Diagnosis Date  . Anemia   . Bronchitis   . Bronchitis, acute   . Depression   . Irregular menstrual bleeding   . Trichomonosis 2016    Patient Active Problem List   Diagnosis Date Noted  . Proteinuria complicating pregnancy in third trimester 05/14/2013  . Supervision of normal first pregnancy 01/01/2013    Past Surgical History:  Procedure Laterality Date  . NO PAST SURGERIES      OB History    Gravida Para Term Preterm AB Living   1 1 1  0 0 1   SAB TAB Ectopic Multiple Live Births   0 0 0 0 1       Home Medications    Prior to Admission medications   Medication Sig Start Date End Date Taking? Authorizing Provider  metroNIDAZOLE (FLAGYL) 500 MG tablet Take 1 tablet (500 mg total) by mouth 2 (two) times daily. 09/03/16  Yes Pattricia Boss, MD  hydrochlorothiazide (MICROZIDE) 12.5 MG capsule Take 2 capsules (25 mg total) by mouth every morning. Patient not taking: Reported on 09/10/2016 07/31/13   Mariel Aloe, MD  ibuprofen (ADVIL,MOTRIN) 200 MG tablet Take 200 mg by mouth every 6 (six) hours as needed for headache (For headache or back ache).    Historical  Provider, MD  nystatin-triamcinolone (MYCOLOG II) cream Apply topically 2 (two) times daily. Patient not taking: Reported on 09/10/2016 07/31/13   Mariel Aloe, MD    Family History Family History  Problem Relation Age of Onset  . Asthma Mother     Social History Social History  Substance Use Topics  . Smoking status: Former Research scientist (life sciences)  . Smokeless tobacco: Never Used  . Alcohol use No     Allergies   Tomato   Review of Systems Review of Systems  Gastrointestinal: Positive for abdominal pain.   10 Systems reviewed and are negative for acute change except as noted in the HPI.   Physical Exam Updated Vital Signs BP 131/75 (BP Location: Right Arm)   Pulse 85   Temp 98.1 F (36.7 C) (Oral)   Resp 16   Ht 5\' 6"  (1.676 m)   Wt 143 lb (64.9 kg)   LMP 07/28/2016   SpO2 100%   BMI 23.08 kg/m   Physical Exam  Constitutional: She is oriented to person, place, and time. She appears well-developed and well-nourished. No distress.  HENT:  Head: Normocephalic and atraumatic.  Moist mucous membranes  Eyes: Conjunctivae are normal. Pupils are equal, round, and reactive to light.  Neck: Neck supple.  Cardiovascular: Normal rate, regular rhythm and normal heart sounds.  No murmur heard. Pulmonary/Chest: Effort normal and breath sounds normal.  Abdominal: Soft. Bowel sounds are normal. She exhibits no distension. There is tenderness. There is no rebound and no guarding.  TTP suprapubic abd and RLQ  Musculoskeletal: She exhibits no edema.  Neurological: She is alert and oriented to person, place, and time.  Fluent speech  Skin: Skin is warm and dry.  Psychiatric: She has a normal mood and affect. Judgment normal.  Nursing note and vitals reviewed. Chaperone was present during exam.    ED Treatments / Results  Labs (all labs ordered are listed, but only abnormal results are displayed) Labs Reviewed  COMPREHENSIVE METABOLIC PANEL - Abnormal; Notable for the following:        Result Value   BUN 5 (*)    Calcium 8.8 (*)    Total Protein 6.3 (*)    AST 14 (*)    ALT 9 (*)    Alkaline Phosphatase 31 (*)    All other components within normal limits  CBC WITH DIFFERENTIAL/PLATELET - Abnormal; Notable for the following:    Hemoglobin 8.9 (*)    HCT 28.5 (*)    MCV 70.5 (*)    MCH 22.0 (*)    RDW 19.6 (*)    Platelets 446 (*)    All other components within normal limits  HCG, QUANTITATIVE, PREGNANCY - Abnormal; Notable for the following:    hCG, Beta Chain, Quant, S 14,769 (*)    All other components within normal limits  LIPASE, BLOOD    EKG  EKG Interpretation None       Radiology US Ob Transvaginal  Result Date: 09/10/2016 CLINICAL DATA:  Right-sided pelvic and abdominal pain. EXAM: TRANSVAGINAL OB ULTRASOUND TECHNIQUE: Transvaginal ultrasound was performed for complete evaluation of the gestation as well as the maternal uterus, adnexal regions, and pelvic cul-de-sac. COMPARISON:  Ultrasound dated 09/03/2016 FINDINGS: Intrauterine gestational sac: Single Yolk sac:  Yes Embryo:  No Cardiac Activity: No Heart Rate: No bpm MSD: 12  mm   6 w   0  d Subchorionic hemorrhage:  Small Maternal uterus/adnexae: Complex 2.2 cm lesion on the right ovary, probably a hemorrhagic corpus luteum cyst. This was not visible on the prior study. Ovaries are otherwise normal. No free fluid. IMPRESSION: Single of gestational sac, increased in size from 5 weeks 2 days to 6 weeks 0 days in the 7 day interval. New small subchorionic hemorrhage. No visible embryo. Electronically Signed   By: Lorriane Shire M.D.   On: 09/10/2016 10:03    Procedures Procedures (including critical care time)  Medications Ordered in ED Medications - No data to display   Initial Impression / Assessment and Plan / ED Course  I have reviewed the triage vital signs and the nursing notes.  Pertinent labs & imaging results that were available during my care of the patient were reviewed by me and  considered in my medical decision making (see chart for details).  Clinical Course   PT w/ ongoing abd pain, diagnosed w/ 1st trimester pregnancy last week w/ HCG 5000 but no fetus noted on [place]. She was well appearing w/ stable VS on exam. Focal tenderness of suprapubic abd and RLQ without peritonitis or distension. Labs today show hCG 14,000 and, hemoglobin 8.9 which is slightly lower than previous. Ultrasound shows gestational sac which is now 6 weeks in measurement but no visible embryo. She has small subchorionic hemorrhage and complex lesion on right ovary. Discussed findings with the OB/GYN on call,  Dr.Yi, who stated US was reassuring for IUP and she recommended routine f/u for 8 week appointment. Discussed supportive care and return precautions for threatened miscarriage. Emphasized importance of starting prenatal vitamins and discussed routine prenatal care. Patient voiced understanding and was discharged in satisfactory condition.  Final Clinical Impressions(s) / ED Diagnoses   Final diagnoses:  Threatened abortion in first trimester    New Prescriptions New Prescriptions   No medications on file     Sharlett Iles, MD 09/10/16 1706

## 2016-09-10 NOTE — ED Triage Notes (Signed)
Pt was seen here last week and tx for bacterial vaginosis and pregnancy less than 8 weeks.  States has been taking meds, but symptoms have not improved and states continued lower abdominal pain that is tender on palpation and that she describes as cramping.  Denies diarrhea, vaginal discharge or bleeding.

## 2016-09-10 NOTE — Discharge Instructions (Signed)
START TAKING PRENATAL VITAMINS DAILY. USE TYLENOL ONLY FOR PAIN. AVOID ALCOHOL, SMOKING, AND ILLICIT DRUGS.

## 2016-09-13 LAB — GC/CHLAMYDIA PROBE AMP (~~LOC~~) NOT AT ARMC
CHLAMYDIA, DNA PROBE: NEGATIVE
NEISSERIA GONORRHEA: NEGATIVE

## 2016-09-30 ENCOUNTER — Emergency Department (HOSPITAL_COMMUNITY): Payer: Medicaid Other

## 2016-09-30 ENCOUNTER — Emergency Department (HOSPITAL_COMMUNITY)
Admission: EM | Admit: 2016-09-30 | Discharge: 2016-09-30 | Disposition: A | Discharge status: Home / Self Care | Payer: Medicaid Other | Attending: Emergency Medicine | Admitting: Emergency Medicine

## 2016-09-30 ENCOUNTER — Encounter (HOSPITAL_COMMUNITY): Payer: Self-pay | Admitting: Emergency Medicine

## 2016-09-30 DIAGNOSIS — O2341 Unspecified infection of urinary tract in pregnancy, first trimester: Secondary | ICD-10-CM | POA: Insufficient documentation

## 2016-09-30 DIAGNOSIS — Z3A09 9 weeks gestation of pregnancy: Secondary | ICD-10-CM | POA: Diagnosis not present

## 2016-09-30 DIAGNOSIS — Z3490 Encounter for supervision of normal pregnancy, unspecified, unspecified trimester: Secondary | ICD-10-CM

## 2016-09-30 DIAGNOSIS — J189 Pneumonia, unspecified organism: Secondary | ICD-10-CM

## 2016-09-30 DIAGNOSIS — N390 Urinary tract infection, site not specified: Secondary | ICD-10-CM

## 2016-09-30 DIAGNOSIS — Z87891 Personal history of nicotine dependence: Secondary | ICD-10-CM | POA: Insufficient documentation

## 2016-09-30 LAB — COMPREHENSIVE METABOLIC PANEL
ALBUMIN: 4.2 g/dL (ref 3.5–5.0)
ALT: 9 U/L — ABNORMAL LOW (ref 14–54)
ANION GAP: 11 (ref 5–15)
AST: 14 U/L — ABNORMAL LOW (ref 15–41)
Alkaline Phosphatase: 36 U/L — ABNORMAL LOW (ref 38–126)
BILIRUBIN TOTAL: 0.6 mg/dL (ref 0.3–1.2)
BUN: 5 mg/dL — ABNORMAL LOW (ref 6–20)
CO2: 21 mmol/L — ABNORMAL LOW (ref 22–32)
Calcium: 9.2 mg/dL (ref 8.9–10.3)
Chloride: 103 mmol/L (ref 101–111)
Creatinine, Ser: 0.58 mg/dL (ref 0.44–1.00)
GFR calc Af Amer: >60 mL/min (ref >60)
GFR calc non Af Amer: >60 mL/min (ref >60)
GLUCOSE: 85 mg/dL (ref 65–99)
POTASSIUM: 4.3 mmol/L (ref 3.5–5.1)
Sodium: 135 mmol/L (ref 135–145)
TOTAL PROTEIN: 7.1 g/dL (ref 6.5–8.1)

## 2016-09-30 LAB — URINALYSIS, ROUTINE W REFLEX MICROSCOPIC
Bilirubin Urine: NEGATIVE
Glucose, UA: NEGATIVE mg/dL
Hgb urine dipstick: NEGATIVE
Ketones, ur: 15 mg/dL — AB
NITRITE: POSITIVE — AB
PH: 7 (ref 5.0–8.0)
Protein, ur: NEGATIVE mg/dL
SPECIFIC GRAVITY, URINE: 1.019 (ref 1.005–1.030)

## 2016-09-30 LAB — CBC
HEMATOCRIT: 32.7 % — AB (ref 36.0–46.0)
HEMOGLOBIN: 10.2 g/dL — AB (ref 12.0–15.0)
MCH: 22.6 pg — ABNORMAL LOW (ref 26.0–34.0)
MCHC: 31.2 g/dL (ref 30.0–36.0)
MCV: 72.5 fL — ABNORMAL LOW (ref 78.0–100.0)
Platelets: 407 10*3/uL — ABNORMAL HIGH (ref 150–400)
RBC: 4.51 MIL/uL (ref 3.87–5.11)
RDW: 19.8 % — ABNORMAL HIGH (ref 11.5–15.5)
WBC: 5.4 10*3/uL (ref 4.0–10.5)

## 2016-09-30 LAB — URINE MICROSCOPIC-ADD ON: RBC / HPF: NONE SEEN RBC/hpf (ref 0–5)

## 2016-09-30 LAB — HCG, QUANTITATIVE, PREGNANCY: hCG, Beta Chain, Quant, S: 19258 m[IU]/mL — ABNORMAL HIGH (ref <5)

## 2016-09-30 LAB — LIPASE, BLOOD: Lipase: 34 U/L (ref 11–51)

## 2016-09-30 MED ORDER — CEPHALEXIN 500 MG PO CAPS
500.0000 mg | ORAL_CAPSULE | Freq: Four times a day (QID) | ORAL | 0 refills | Status: DC
Start: 2016-09-30 — End: 2018-05-18

## 2016-09-30 MED ORDER — CEPHALEXIN 250 MG PO CAPS
500.0000 mg | ORAL_CAPSULE | Freq: Once | ORAL | Status: AC
Start: 2016-09-30 — End: 2016-09-30
  Administered 2016-09-30: 500 mg via ORAL
  Filled 2016-09-30: qty 2

## 2016-09-30 NOTE — ED Provider Notes (Signed)
[facility] DEPT Provider Note   CSN: [id number] Arrival date & time: 09/30/16  1140     History   Chief Complaint Chief Complaint  Patient presents with  . Abdominal Pain    HPI Michelle Greer is a 20 y.o. female.   Abdominal Pain   This is a new problem. The current episode started more than 2 days ago. The problem occurs constantly. The problem has not changed since onset.The pain is located in the suprapubic region. The pain is moderate. Pertinent negatives include fever, flatus and melena. Nothing aggravates the symptoms. Nothing relieves the symptoms. Past workup includes ultrasound.    Past Medical History:  Diagnosis Date  . Anemia   . Bronchitis   . Bronchitis, acute   . Depression   . Irregular menstrual bleeding   . Trichomonosis 2016    Patient Active Problem List   Diagnosis Date Noted  . Proteinuria complicating pregnancy in third trimester 05/14/2013  . Supervision of normal first pregnancy 01/01/2013    Past Surgical History:  Procedure Laterality Date  . NO PAST SURGERIES      OB History    Gravida Para Term Preterm AB Living   2 1 1  0 0 1   SAB TAB Ectopic Multiple Live Births   0 0 0 0 1       Home Medications    Prior to Admission medications   Medication Sig Start Date End Date Taking? Authorizing Provider  cephALEXin (KEFLEX) 500 MG capsule Take 1 capsule (500 mg total) by mouth 4 (four) times daily. 09/30/16   Merrily Pew, MD  hydrochlorothiazide (MICROZIDE) 12.5 MG capsule Take 2 capsules (25 mg total) by mouth every morning. Patient not taking: Reported on 09/10/2016 07/31/13   Mariel Aloe, MD  ibuprofen (ADVIL,MOTRIN) 200 MG tablet Take 200 mg by mouth every 6 (six) hours as needed for headache (For headache or back ache).    Historical Provider, MD  metroNIDAZOLE (FLAGYL) 500 MG tablet Take 1 tablet (500 mg total) by mouth 2 (two) times daily. 09/03/16   Pattricia Boss, MD  nystatin-triamcinolone (MYCOLOG II) cream Apply  topically 2 (two) times daily. Patient not taking: Reported on 09/10/2016 07/31/13   Mariel Aloe, MD    Family History Family History  Problem Relation Age of Onset  . Asthma Mother     Social History Social History  Substance Use Topics  . Smoking status: Former Research scientist (life sciences)  . Smokeless tobacco: Never Used  . Alcohol use No     Allergies   Tomato   Review of Systems Review of Systems  Constitutional: Negative for fever.  Gastrointestinal: Positive for abdominal pain. Negative for flatus and melena.  All other systems reviewed and are negative.    Physical Exam Updated Vital Signs BP 137/73 (BP Location: Left Arm)   Pulse 73   Temp 98.5 F (36.9 C) (Oral)   Resp 18   Ht 5\' 5"  (1.651 m)   Wt 134 lb (60.8 kg)   LMP 07/28/2016   SpO2 100%   BMI 22.30 kg/m   Physical Exam  Constitutional: She is oriented to person, place, and time. She appears well-developed and well-nourished.  HENT:  Head: Normocephalic and atraumatic.  Eyes: Conjunctivae and EOM are normal.  Neck: Normal range of motion.  Cardiovascular: Normal rate and regular rhythm.   Pulmonary/Chest: Effort normal and breath sounds normal. No stridor. No respiratory distress. She has no wheezes.  Abdominal: Soft. She exhibits no distension. There is tenderness (  suprapubic).  Neurological: She is alert and oriented to person, place, and time.  Skin: Skin is warm and dry.  Nursing note and vitals reviewed.    ED Treatments / Results  Labs (all labs ordered are listed, but only abnormal results are displayed) Labs Reviewed  COMPREHENSIVE METABOLIC PANEL - Abnormal; Notable for the following:       Result Value   CO2 21 (*)    BUN 5 (*)    AST 14 (*)    ALT 9 (*)    Alkaline Phosphatase 36 (*)    All other components within normal limits  CBC - Abnormal; Notable for the following:    Hemoglobin 10.2 (*)    HCT 32.7 (*)    MCV 72.5 (*)    MCH 22.6 (*)    RDW 19.8 (*)    Platelets 407 (*)    All  other components within normal limits  URINALYSIS, ROUTINE W REFLEX MICROSCOPIC (NOT AT [facility]) - Abnormal; Notable for the following:    APPearance CLOUDY (*)    Ketones, ur 15 (*)    Nitrite POSITIVE (*)    Leukocytes, UA MODERATE (*)    All other components within normal limits  HCG, QUANTITATIVE, PREGNANCY - Abnormal; Notable for the following:    hCG, Beta Chain, Quant, S 19,258 (*)    All other components within normal limits  URINE MICROSCOPIC-ADD ON - Abnormal; Notable for the following:    Squamous Epithelial / LPF 0-5 (*)    Bacteria, UA MANY (*)    All other components within normal limits  LIPASE, BLOOD    EKG  EKG Interpretation  Date/Time:  Thursday September 30 2016 15:14:09 EST Ventricular Rate:  73 PR Interval:  148 QRS Duration: 98 QT Interval:  366 QTC Calculation: 403 R Axis:   67 Text Interpretation:  Normal sinus rhythm Incomplete right bundle branch block Borderline ECG Confirmed by [facility] MD, Corene Cornea [phone]) on 09/30/2016 5:07:03 PM       Radiology Dg Chest 2 View  Result Date: 09/30/2016 CLINICAL DATA:  Abdominal pain. EXAM: CHEST  2 VIEW COMPARISON:  No recent prior. FINDINGS: Mediastinum hilar structures normal. Lungs are clear. No pleural effusion pneumothorax. Borderline cardiomegaly. No acute bony abnormality . IMPRESSION: No acute cardiopulmonary disease. Electronically Signed   By: Marcello Moores  Register   On: 09/30/2016 15:42   US Ob Comp Less 14 Wks  Result Date: 09/30/2016 CLINICAL DATA:  Abdominal pain started 3 days ago. Quantitative beta HCG 19,258. Two weeks ago, Quantitative beta HCG was 14,769. LMP was09/04/2016. Gestational age by LMP is9 weeks 1 day. EXAM: OBSTETRIC <14 WK [place] AND TRANSVAGINAL OB US TECHNIQUE: Both transabdominal and transvaginal ultrasound examinations were performed for complete evaluation of the gestation as well as the maternal uterus, adnexal regions, and pelvic cul-de-sac. Transvaginal technique was performed to assess early  pregnancy. COMPARISON:  09/10/2016 FINDINGS: Intrauterine gestational sac: Present Yolk sac:  Present Embryo:  Possibly present Cardiac Activity: Not seen Heart Rate: Absent  bpm CRL:  3.5  mm   6 w   0 d                  [place] EDC: Subchorionic hemorrhage:  None visualized. Maternal uterus/adnexae: Normal appearance of both ovaries. No subchorionic hemorrhage or free pelvic fluid. IMPRESSION: 1. Interval development of possible small embryonic pole adjacent to the yolk sac. 2. No fetal heart tones identified at this time. 3. The delayed progression is suspicious but not yet  definitive for failed pregnancy. 4. Followup scan is suggested in 14 days to assess presence or absence of interval progression and to assess for fetal cardiac activity. Electronically Signed   By: Nolon Nations M.D.   On: 09/30/2016 17:51    Procedures Procedures (including critical care time)  Medications Ordered in ED Medications  cephALEXin (KEFLEX) capsule 500 mg (500 mg Oral Given 09/30/16 1553)     Initial Impression / Assessment and Plan / ED Course  I have reviewed the triage vital signs and the nursing notes.  Pertinent labs & imaging results that were available during my care of the patient were reviewed by me and considered in my medical decision making (see chart for details).  Clinical Course    20 yo F w/ LMP September 6, unconfirmed IUP here with continued symptoms. Has UTI, will treat.  Has some intermittent chest pressure but negative PERC, no RF for ACS, has had an associated cough so will get ecg and CXR.  On my BUS, gestational sac seen but no obvious fetal pole which should be present. Plan for formal US.  No definitive viable intrauterine pregnancy, radiology recommends ultrasound in 2 weeks. Discussed the case with Dr. Roselie Awkward at [facility] who states the patient should be able to be seen and they will call her to get an appointment tomorrow.  Final Clinical Impressions(s) / ED Diagnoses   Final  diagnoses:  Lower urinary tract infectious disease  Pregnancy, unspecified gestational age    New Prescriptions Discharge Medication List as of 09/30/2016  6:31 PM    START taking these medications   Details  cephALEXin (KEFLEX) 500 MG capsule Take 1 capsule (500 mg total) by mouth 4 (four) times daily., Starting Thu 09/30/2016, Print         Merrily Pew, MD 09/30/16 2125

## 2016-09-30 NOTE — ED Triage Notes (Signed)
Pt from home with c/o generalized abdominal pain with N/V starting yesterday.  Pt states she knows she's pregnant but does not know how far along she is.  Pt states she tried to make an appointment with [facility] but they would not let her.  Pt denies vaginal bleeding but reports sticky vaginal discharge and strong urine odor.  NAD, A&O.

## 2016-09-30 NOTE — ED Notes (Signed)
Declined W/C at D/C and was escorted to lobby by RN. 

## 2016-10-02 ENCOUNTER — Emergency Department (HOSPITAL_COMMUNITY): Payer: Medicaid Other

## 2016-10-02 ENCOUNTER — Emergency Department (HOSPITAL_COMMUNITY)
Admission: EM | Admit: 2016-10-02 | Discharge: 2016-10-02 | Disposition: A | Discharge status: Home / Self Care | Payer: Medicaid Other | Attending: Emergency Medicine | Admitting: Emergency Medicine

## 2016-10-02 ENCOUNTER — Encounter (HOSPITAL_COMMUNITY): Payer: Self-pay

## 2016-10-02 DIAGNOSIS — O0281 Inappropriate change in quantitative human chorionic gonadotropin (hCG) in early pregnancy: Secondary | ICD-10-CM | POA: Diagnosis not present

## 2016-10-02 DIAGNOSIS — Z87891 Personal history of nicotine dependence: Secondary | ICD-10-CM | POA: Diagnosis not present

## 2016-10-02 DIAGNOSIS — Z3A09 9 weeks gestation of pregnancy: Secondary | ICD-10-CM | POA: Diagnosis not present

## 2016-10-02 DIAGNOSIS — O200 Threatened abortion: Secondary | ICD-10-CM | POA: Insufficient documentation

## 2016-10-02 DIAGNOSIS — O2311 Infections of bladder in pregnancy, first trimester: Secondary | ICD-10-CM | POA: Insufficient documentation

## 2016-10-02 DIAGNOSIS — N3000 Acute cystitis without hematuria: Secondary | ICD-10-CM

## 2016-10-02 LAB — CBC WITH DIFFERENTIAL/PLATELET
BASOS ABS: 0 10*3/uL (ref 0.0–0.1)
Basophils Relative: 1 %
EOS ABS: 0.3 10*3/uL (ref 0.0–0.7)
EOS PCT: 5 %
HCT: 35.3 % — ABNORMAL LOW (ref 36.0–46.0)
HEMOGLOBIN: 11.1 g/dL — AB (ref 12.0–15.0)
LYMPHS PCT: 37 %
Lymphs Abs: 1.8 10*3/uL (ref 0.7–4.0)
MCH: 23.1 pg — ABNORMAL LOW (ref 26.0–34.0)
MCHC: 31.4 g/dL (ref 30.0–36.0)
MCV: 73.4 fL — AB (ref 78.0–100.0)
Monocytes Absolute: 0.6 10*3/uL (ref 0.1–1.0)
Monocytes Relative: 11 %
NEUTROS PCT: 46 %
Neutro Abs: 2.3 10*3/uL (ref 1.7–7.7)
PLATELETS: 446 10*3/uL — AB (ref 150–400)
RBC: 4.81 MIL/uL (ref 3.87–5.11)
RDW: 20 % — ABNORMAL HIGH (ref 11.5–15.5)
WBC: 5 10*3/uL (ref 4.0–10.5)

## 2016-10-02 LAB — BASIC METABOLIC PANEL
Anion gap: 10 (ref 5–15)
BUN: 6 mg/dL (ref 6–20)
CO2: 21 mmol/L — ABNORMAL LOW (ref 22–32)
Calcium: 9.5 mg/dL (ref 8.9–10.3)
Chloride: 106 mmol/L (ref 101–111)
Creatinine, Ser: 0.61 mg/dL (ref 0.44–1.00)
Glucose, Bld: 80 mg/dL (ref 65–99)
POTASSIUM: 3.7 mmol/L (ref 3.5–5.1)
SODIUM: 137 mmol/L (ref 135–145)

## 2016-10-02 LAB — URINALYSIS, ROUTINE W REFLEX MICROSCOPIC
Bilirubin Urine: NEGATIVE
Glucose, UA: NEGATIVE mg/dL
Hgb urine dipstick: NEGATIVE
NITRITE: POSITIVE — AB
PROTEIN: NEGATIVE mg/dL
Specific Gravity, Urine: 1.024 (ref 1.005–1.030)
pH: 6.5 (ref 5.0–8.0)

## 2016-10-02 LAB — HCG, QUANTITATIVE, PREGNANCY: HCG, BETA CHAIN, QUANT, S: 20890 m[IU]/mL — AB (ref <5)

## 2016-10-02 LAB — WET PREP, GENITAL
CLUE CELLS WET PREP: NONE SEEN
Sperm: NONE SEEN
TRICH WET PREP: NONE SEEN
YEAST WET PREP: NONE SEEN

## 2016-10-02 LAB — URINE MICROSCOPIC-ADD ON: RBC / HPF: NONE SEEN RBC/hpf (ref 0–5)

## 2016-10-02 NOTE — ED Provider Notes (Signed)
[facility] DEPT Provider Note   CSN: [id number] Arrival date & time: 10/02/16  1056     History   Chief Complaint Chief Complaint  Patient presents with  . Abdominal Cramping  . vaginal bleeding/pregnant    HPI Michelle Greer is a 20 y.o. female.  HPI   G2P1 currently pregnant with LMP 07/28/16 with 4 recent ED visits for complications within the first trimester, no prenatal care, p/w lower abdominal pain and vaginal bleeding that began yesterday.  The pain is constant, described as "something balled up," and the vaginal bleeding is red blood only "like a period."  Denies fevers, vomiting, dysuria or urinary urgency, bowel changes.  Pt reports she has been taking the antibiotics for her recently diagnosed UTI.   Pt also notes continued chest pain, which she was evaluated for at last ED visit on 09/30/16.  Reports it feels like pressure on her chest, has been constant x 1 week, is worse with eating.    ED visit 09/03/16 for pelvic pain, tx for GC/Chlam, BV ED visit 09/10/16  Dx threatened abortion. ED visit 09/30/16 [place] concerning but not definitive for failed pregnancy, OBGYN consulted, follow up planned.  also Dx UTI.    Past Medical History:  Diagnosis Date  . Anemia   . Bronchitis   . Bronchitis, acute   . Depression   . Irregular menstrual bleeding   . Trichomonosis 2016    Patient Active Problem List   Diagnosis Date Noted  . Proteinuria complicating pregnancy in third trimester 05/14/2013  . Supervision of normal first pregnancy 01/01/2013    Past Surgical History:  Procedure Laterality Date  . NO PAST SURGERIES      OB History    Gravida Para Term Preterm AB Living   2 1 1  0 0 1   SAB TAB Ectopic Multiple Live Births   0 0 0 0 1       Home Medications    Prior to Admission medications   Medication Sig Start Date End Date Taking? Authorizing Provider  cephALEXin (KEFLEX) 500 MG capsule Take 1 capsule (500 mg total) by mouth 4 (four) times  daily. Patient taking differently: Take 500 mg by mouth 4 (four) times daily. 5 days remaining including today 09/30/16  Yes Merrily Pew, MD  Prenatal Vit-Fe Fumarate-FA (PRENATAL MULTIVITAMIN) TABS tablet Take 1 tablet by mouth daily at 12 noon.   Yes Historical Provider, MD  hydrochlorothiazide (MICROZIDE) 12.5 MG capsule Take 2 capsules (25 mg total) by mouth every morning. Patient not taking: Reported on 09/10/2016 07/31/13   Mariel Aloe, MD    Family History Family History  Problem Relation Age of Onset  . Asthma Mother     Social History Social History  Substance Use Topics  . Smoking status: Former Research scientist (life sciences)  . Smokeless tobacco: Never Used  . Alcohol use No     Allergies   Tomato   Review of Systems Review of Systems  All other systems reviewed and are negative.    Physical Exam Updated Vital Signs BP 107/56 (BP Location: Left Arm)   Pulse 81   Temp 97.5 F (36.4 C) (Oral)   Resp 16   Ht 5\' 5"  (1.651 m)   Wt 60.8 kg   LMP 07/28/2016   SpO2 100%   BMI 22.30 kg/m   Physical Exam  Constitutional: She appears well-developed and well-nourished. No distress.  HENT:  Head: Normocephalic and atraumatic.  Neck: Neck supple.  Cardiovascular: Normal rate and regular  rhythm.   Pulmonary/Chest: Effort normal and breath sounds normal. No respiratory distress. She has no wheezes. She has no rales.  Abdominal: Soft. She exhibits no distension. There is tenderness in the suprapubic area. There is no rigidity, no rebound and no guarding.  Genitourinary:  Genitourinary Comments: Cervical os open to fingertip.  Small amount of dark blood in vagina.    Neurological: She is alert.  Skin: She is not diaphoretic.  Nursing note and vitals reviewed.    ED Treatments / Results  Labs (all labs ordered are listed, but only abnormal results are displayed) Labs Reviewed  WET PREP, GENITAL - Abnormal; Notable for the following:       Result Value   WBC, Wet Prep HPF POC MANY  (*)    All other components within normal limits  BASIC METABOLIC PANEL - Abnormal; Notable for the following:    CO2 21 (*)    All other components within normal limits  CBC WITH DIFFERENTIAL/PLATELET - Abnormal; Notable for the following:    Hemoglobin 11.1 (*)    HCT 35.3 (*)    MCV 73.4 (*)    MCH 23.1 (*)    RDW 20.0 (*)    Platelets 446 (*)    All other components within normal limits  HCG, QUANTITATIVE, PREGNANCY - Abnormal; Notable for the following:    hCG, Beta Chain, Quant, S 20,890 (*)    All other components within normal limits  URINALYSIS, ROUTINE W REFLEX MICROSCOPIC (NOT AT [facility]) - Abnormal; Notable for the following:    Ketones, ur >80 (*)    Nitrite POSITIVE (*)    Leukocytes, UA SMALL (*)    All other components within normal limits  URINE MICROSCOPIC-ADD ON - Abnormal; Notable for the following:    Squamous Epithelial / LPF 0-5 (*)    Bacteria, UA MANY (*)    All other components within normal limits  URINE CULTURE  GC/CHLAMYDIA PROBE AMP ([place]) NOT AT [facility]    EKG  EKG Interpretation None       Radiology Dg Chest 2 View  Result Date: 09/30/2016 CLINICAL DATA:  Abdominal pain. EXAM: CHEST  2 VIEW COMPARISON:  No recent prior. FINDINGS: Mediastinum hilar structures normal. Lungs are clear. No pleural effusion pneumothorax. Borderline cardiomegaly. No acute bony abnormality . IMPRESSION: No acute cardiopulmonary disease. Electronically Signed   By: Marcello Moores  Register   On: 09/30/2016 15:42   US Ob Comp Less 14 Wks  Result Date: 10/02/2016 CLINICAL DATA:  Pregnant patient with vaginal bleeding. EXAM: OBSTETRIC <14 WK [place] AND TRANSVAGINAL OB US TECHNIQUE: Both transabdominal and transvaginal ultrasound examinations were performed for complete evaluation of the gestation as well as the maternal uterus, adnexal regions, and pelvic cul-de-sac. Transvaginal technique was performed to assess early pregnancy. COMPARISON:  Pelvic ultrasound 09/30/2016.  FINDINGS: Intrauterine gestational sac: Single Yolk sac:  Visualized. Embryo:  Not Visualized. Cardiac Activity: Not Visualized. MSD: 16.8   mm   6 w   4  d Subchorionic hemorrhage:  None visualized. Maternal uterus/adnexae: Probable corpus luteum within the right ovary. Left ovary is normal in appearance. No significant free fluid in the pelvis. IMPRESSION: Intrauterine gestational sac and yolk sac. No definite embryo identified on current evaluation. No fetal heartbeat identified. The delayed progression as well as changing sonographic appearance is suspicious for failed pregnancy. Recommend follow-up pelvic ultrasound in 10-14 days for confirmation. Electronically Signed   By: Lovey Newcomer M.D.   On: 10/02/2016 13:15   US  Ob Comp Less 14 Wks  Result Date: 09/30/2016 CLINICAL DATA:  Abdominal pain started 3 days ago. Quantitative beta HCG 19,258. Two weeks ago, Quantitative beta HCG was 14,769. LMP was09/04/2016. Gestational age by LMP is9 weeks 1 day. EXAM: OBSTETRIC <14 WK [place] AND TRANSVAGINAL OB US TECHNIQUE: Both transabdominal and transvaginal ultrasound examinations were performed for complete evaluation of the gestation as well as the maternal uterus, adnexal regions, and pelvic cul-de-sac. Transvaginal technique was performed to assess early pregnancy. COMPARISON:  09/10/2016 FINDINGS: Intrauterine gestational sac: Present Yolk sac:  Present Embryo:  Possibly present Cardiac Activity: Not seen Heart Rate: Absent  bpm CRL:  3.5  mm   6 w   0 d                  [place] EDC: Subchorionic hemorrhage:  None visualized. Maternal uterus/adnexae: Normal appearance of both ovaries. No subchorionic hemorrhage or free pelvic fluid. IMPRESSION: 1. Interval development of possible small embryonic pole adjacent to the yolk sac. 2. No fetal heart tones identified at this time. 3. The delayed progression is suspicious but not yet definitive for failed pregnancy. 4. Followup scan is suggested in 14 days to assess presence or  absence of interval progression and to assess for fetal cardiac activity. Electronically Signed   By: Nolon Nations M.D.   On: 09/30/2016 17:51   US Ob Transvaginal  Result Date: 10/02/2016 CLINICAL DATA:  Pregnant patient with vaginal bleeding. EXAM: OBSTETRIC <14 WK [place] AND TRANSVAGINAL OB US TECHNIQUE: Both transabdominal and transvaginal ultrasound examinations were performed for complete evaluation of the gestation as well as the maternal uterus, adnexal regions, and pelvic cul-de-sac. Transvaginal technique was performed to assess early pregnancy. COMPARISON:  Pelvic ultrasound 09/30/2016. FINDINGS: Intrauterine gestational sac: Single Yolk sac:  Visualized. Embryo:  Not Visualized. Cardiac Activity: Not Visualized. MSD: 16.8   mm   6 w   4  d Subchorionic hemorrhage:  None visualized. Maternal uterus/adnexae: Probable corpus luteum within the right ovary. Left ovary is normal in appearance. No significant free fluid in the pelvis. IMPRESSION: Intrauterine gestational sac and yolk sac. No definite embryo identified on current evaluation. No fetal heartbeat identified. The delayed progression as well as changing sonographic appearance is suspicious for failed pregnancy. Recommend follow-up pelvic ultrasound in 10-14 days for confirmation. Electronically Signed   By: Lovey Newcomer M.D.   On: 10/02/2016 13:15    Procedures Procedures (including critical care time)  Medications Ordered in ED Medications - No data to display   Initial Impression / Assessment and Plan / ED Course  I have reviewed the triage vital signs and the nursing notes.  Pertinent labs & imaging results that were available during my care of the patient were reviewed by me and considered in my medical decision making (see chart for details).  Clinical Course    Afebrile nontoxic patient with multiple ED visits for current pregnancy, no current prenatal care.  Today presented with two days of lower abdominal pain and vaginal  bleeding.  Labs demonstrate slight increase in HCG.  US   Pelvic exam with small amount of blood, no other tissue or discharge.  Os open to fingertip.  Pt has taken 4 keflex at home and no longer has symptoms.  No urine culture found from recent visit, ordered today.  Pt to continue with keflex as previously prescribed.  Pt advised to follow up with OBGYN and [place] expressed frustration that she continues to return to this hospital  and her symptoms continue and she does not have resolution of this yet.  I spoke with her at length about the findings, her diagnosis, expectations, and appropriate follow up, return precautions, and also encouraged her to engage the available OBGYN services in town.  Pt counseled on pelvic rest.  D/C home.    Final Clinical Impressions(s) / ED Diagnoses   Final diagnoses:  Threatened abortion  Acute cystitis without hematuria    New Prescriptions Discharge Medication List as of 10/02/2016  2:16 PM       Clayton Bibles, PA-C 10/02/16 1449    Milton Ferguson, MD 10/03/16 1416

## 2016-10-02 NOTE — ED Triage Notes (Signed)
Patient here with abdominal cramping, vaginal bleeding since yesterday. States that she was told she was pregnant on previous visit in September and has not established any prenatal care. Patient tearful on arrival, NAD. G2, P1

## 2016-10-02 NOTE — ED Notes (Signed)
Pt returned from US

## 2016-10-02 NOTE — ED Notes (Signed)
Pt transported to US

## 2016-10-02 NOTE — Discharge Instructions (Signed)
Read the information below.  You may return to the Emergency Department at any time for worsening condition or any new symptoms that concern you.  If you develop high fevers, worsening abdominal pain, uncontrolled vomiting, or are unable to tolerate fluids by mouth, return to the ER for a recheck.   °

## 2016-10-04 ENCOUNTER — Encounter (HOSPITAL_COMMUNITY): Payer: Self-pay

## 2016-10-04 ENCOUNTER — Inpatient Hospital Stay (HOSPITAL_COMMUNITY)
Admission: AD | Admit: 2016-10-04 | Discharge: 2016-10-04 | Disposition: A | Discharge status: Home / Self Care | Payer: Medicaid Other | Source: Ambulatory Visit | Attending: Obstetrics & Gynecology | Admitting: Obstetrics & Gynecology

## 2016-10-04 DIAGNOSIS — Z87891 Personal history of nicotine dependence: Secondary | ICD-10-CM | POA: Insufficient documentation

## 2016-10-04 DIAGNOSIS — O0330 Unspecified complication following incomplete spontaneous abortion: Secondary | ICD-10-CM | POA: Diagnosis present

## 2016-10-04 DIAGNOSIS — O039 Complete or unspecified spontaneous abortion without complication: Secondary | ICD-10-CM

## 2016-10-04 LAB — URINE CYTOLOGY ANCILLARY ONLY

## 2016-10-04 LAB — CBC
HEMATOCRIT: 33 % — AB (ref 36.0–46.0)
HEMOGLOBIN: 10.9 g/dL — AB (ref 12.0–15.0)
MCH: 23.7 pg — ABNORMAL LOW (ref 26.0–34.0)
MCHC: 33 g/dL (ref 30.0–36.0)
MCV: 71.7 fL — AB (ref 78.0–100.0)
Platelets: 439 10*3/uL — ABNORMAL HIGH (ref 150–400)
RBC: 4.6 MIL/uL (ref 3.87–5.11)
RDW: 20.1 % — ABNORMAL HIGH (ref 11.5–15.5)
WBC: 6.5 10*3/uL (ref 4.0–10.5)

## 2016-10-04 LAB — GC/CHLAMYDIA PROBE AMP (~~LOC~~) NOT AT ARMC
CHLAMYDIA, DNA PROBE: NEGATIVE
NEISSERIA GONORRHEA: NEGATIVE

## 2016-10-04 MED ORDER — IBUPROFEN 600 MG PO TABS
600.0000 mg | ORAL_TABLET | Freq: Once | ORAL | Status: AC
  Administered 2016-10-04: 600 mg via ORAL
  Filled 2016-10-04: qty 1

## 2016-10-04 MED ORDER — LACTATED RINGERS IV BOLUS (SEPSIS)
500.0000 mL | Freq: Once | INTRAVENOUS | Status: AC
  Administered 2016-10-04: 500 mL via INTRAVENOUS

## 2016-10-04 MED ORDER — IBUPROFEN 600 MG PO TABS: 600.0000 mg | ORAL_TABLET | Freq: Three times a day (TID) | ORAL | 0 refills | Status: DC | PRN

## 2016-10-04 MED ORDER — ACETAMINOPHEN-CODEINE 300-30 MG PO TABS: 1.0000 | ORAL_TABLET | ORAL | 0 refills | Status: DC | PRN

## 2016-10-04 MED ORDER — HYDROMORPHONE HCL 1 MG/ML IJ SOLN
1.0000 mg | Freq: Once | INTRAMUSCULAR | Status: AC
  Administered 2016-10-04: 1 mg via INTRAVENOUS
  Filled 2016-10-04: qty 1

## 2016-10-04 NOTE — MAU Note (Signed)
Patient present to MAU via EMS with vaginal bleeding and cramping that began 10/03/16. Patient went to [facility] ER yesterday for light bleeding. Patient reports that bleeding got heavier today and has used 15 sanitary napkins.

## 2016-10-04 NOTE — Discharge Instructions (Signed)
Miscarriage  A miscarriage is the sudden loss of an unborn baby (fetus) before the 20th week of pregnancy. Most miscarriages happen in the first 3 months of pregnancy. Sometimes, it happens before a woman even knows she is pregnant. A miscarriage is also called a "spontaneous miscarriage" or "early pregnancy loss." Having a miscarriage can be an emotional experience. Talk with your caregiver about any questions you may have about miscarrying, the grieving process, and your future pregnancy plans.  CAUSES    Problems with the fetal chromosomes that make it impossible for the baby to develop normally. Problems with the baby's genes or chromosomes are most often the result of errors that occur, by chance, as the embryo divides and grows. The problems are not inherited from the parents.   Infection of the cervix or uterus.    Hormone problems.    Problems with the cervix, such as having an incompetent cervix. This is when the tissue in the cervix is not strong enough to hold the pregnancy.    Problems with the uterus, such as an abnormally shaped uterus, uterine fibroids, or congenital abnormalities.    Certain medical conditions.    Smoking, drinking alcohol, or taking illegal drugs.    Trauma.   Often, the cause of a miscarriage is unknown.   SYMPTOMS    Vaginal bleeding or spotting, with or without cramps or pain.   Pain or cramping in the abdomen or lower back.   Passing fluid, tissue, or blood clots from the vagina.  DIAGNOSIS   Your caregiver will perform a physical exam. You may also have an ultrasound to confirm the miscarriage. Blood or urine tests may also be ordered.  TREATMENT    Sometimes, treatment is not necessary if you naturally pass all the fetal tissue that was in the uterus. If some of the fetus or placenta remains in the body (incomplete miscarriage), tissue left behind may become infected and must be removed. Usually, a dilation and curettage (D and C) procedure is performed.  During a D and C procedure, the cervix is widened (dilated) and any remaining fetal or placental tissue is gently removed from the uterus.   Antibiotic medicines are prescribed if there is an infection. Other medicines may be given to reduce the size of the uterus (contract) if there is a lot of bleeding.   If you have Rh negative blood and your baby was Rh positive, you will need a Rh immunoglobulin shot. This shot will protect any future baby from having Rh blood problems in future pregnancies.  HOME CARE INSTRUCTIONS    Your caregiver may order bed rest or may allow you to continue light activity. Resume activity as directed by your caregiver.   Have someone help with home and family responsibilities during this time.    Keep track of the number of sanitary pads you use each day and how soaked (saturated) they are. Write down this information.    Do not use tampons. Do not douche or have sexual intercourse until approved by your caregiver.    Only take over-the-counter or prescription medicines for pain or discomfort as directed by your caregiver.    Do not take aspirin. Aspirin can cause bleeding.    Keep all follow-up appointments with your caregiver.    If you or your partner have problems with grieving, talk to your caregiver or seek counseling to help cope with the pregnancy loss. Allow enough time to grieve before trying to get pregnant again.     SEEK IMMEDIATE MEDICAL CARE IF:    You have severe cramps or pain in your back or abdomen.   You have a fever.   You pass large blood clots (walnut-sized or larger) ortissue from your vagina. Save any tissue for your caregiver to inspect.    Your bleeding increases.    You have a thick, bad-smelling vaginal discharge.   You become lightheaded, weak, or you faint.    You have chills.   MAKE SURE YOU:   Understand these instructions.   Will watch your condition.   Will get help right away if you are not doing well or get worse.     This  information is not intended to replace advice given to you by your health care provider. Make sure you discuss any questions you have with your health care provider.     Document Released: 05/04/2001 Document Revised: 03/05/2013 Document Reviewed: 12/28/2011  Elsevier Interactive Patient Education 2016 [employer]vier Inc[employer]

## 2016-10-04 NOTE — Progress Notes (Signed)
Notified of pt arrival in MAU and bleeding, with patient stating she thinks she passed something. Will come to bedside.

## 2016-10-04 NOTE — MAU Provider Note (Signed)
History   [id number]   Chief Complaint  Patient presents with  . Vaginal Bleeding  . Abdominal Cramping    HPI Michelle Greer is a 20 y.o. female  G2P1001 here with report of increased vaginal bleeding with the passage of clots and pelvic cramping.  Pt has been previously in the ED on four separate visits during this pregnancy.  D First visit was on 09/03/16, pt presented with pelvic pain.  Diagnosed with both a UTI and bacterial vaginosis with treatment provided.  The following is a summary of pertinent pregnancy info from those visits:  10/13 BHCG 5971 [place]: IUGS 6.4 mm, +yolk sac, no embyo, 2.5 cm complex lesion w/in right ovarian parenchyma  10/20 BHCG 14769  11/9 BHCG 19,258 [place]:  IUGS (no measurement); +yolk, ?embyro 3.103mm, no FHR, ovaries nml  11/11 BHCG 20,890 [place]:  IUGS (16.8 mm), +yolk, no embryo, ovaries; probable right corpus luteum     Patient's last menstrual period was 07/28/2016.  OB History  Gravida Para Term Preterm AB Living  2 1 1  0 0 1  SAB TAB Ectopic Multiple Live Births  0 0 0 0 1    # Outcome Date GA Lbr Len/2nd Weight Sex Delivery Anes PTL Lv  2 Current           1 Term 07/16/13 [redacted]w[redacted]d 13:15 / 00:53 6 lb 6.4 oz (2.903 kg) M Vag-Spont EPI  LIV      Past Medical History:  Diagnosis Date  . Anemia   . Bronchitis   . Bronchitis, acute   . Depression   . Irregular menstrual bleeding   . Trichomonosis 2016    Family History  Problem Relation Age of Onset  . Asthma Mother     Social History   Social History  . Marital status: Single    Spouse name: N/A  . Number of children: N/A  . Years of education: N/A   Social History Main Topics  . Smoking status: Former Research scientist (life sciences)  . Smokeless tobacco: Never Used  . Alcohol use No  . Drug use: No  . Sexual activity: Yes    Birth control/ protection: None   Other Topics Concern  . None   Social History Narrative  . None    Allergies  Allergen Reactions  . Tomato Hives    No current  facility-administered medications on file prior to encounter.    Current Outpatient Prescriptions on File Prior to Encounter  Medication Sig Dispense Refill  . cephALEXin (KEFLEX) 500 MG capsule Take 1 capsule (500 mg total) by mouth 4 (four) times daily. (Patient taking differently: Take 500 mg by mouth 4 (four) times daily. 5 days remaining including today) 28 capsule 0  . hydrochlorothiazide (MICROZIDE) 12.5 MG capsule Take 2 capsules (25 mg total) by mouth every morning. 30 capsule 0  . Prenatal Vit-Fe Fumarate-FA (PRENATAL MULTIVITAMIN) TABS tablet Take 1 tablet by mouth daily at 12 noon.       Review of Systems  Constitutional: Positive for fever.  Gastrointestinal: Negative for nausea and vomiting.  Genitourinary: Positive for pelvic pain and vaginal bleeding. Negative for dysuria, flank pain and frequency.  Skin: Negative for color change.  Neurological: Negative for dizziness, light-headedness and headaches.  All other systems reviewed and are negative.    Physical Exam   Vitals:   10/04/16 0409  BP: 139/71  Pulse: 87  Resp: 18  Temp: 97.5 F (36.4 C)  TempSrc: Oral    Physical Exam  Constitutional: She is  oriented to person, place, and time. She appears well-developed and well-nourished.  HENT:  Head: Normocephalic.  Neck: Normal range of motion. Neck supple.  Cardiovascular: Normal rate, regular rhythm and normal heart sounds.   Respiratory: Effort normal and breath sounds normal. No respiratory distress.  GI: Soft. There is no tenderness.  Genitourinary: Uterus is enlarged. Cervix exhibits no motion tenderness. Right adnexum displays no tenderness. Left adnexum displays no tenderness. There is bleeding (moderate; products of conception seen on pad in bed (yolk sac and gestational sac intact)) in the vagina.  Musculoskeletal: Normal range of motion. She exhibits no edema.  Neurological: She is alert and oriented to person, place, and time.  Skin: Skin is warm and  dry.    MAU Course  Procedures  MDM Results for orders placed or performed during the hospital encounter of 10/04/16 (from the past 24 hour(s))  CBC     Status: Abnormal   Collection Time: 10/04/16  4:42 AM  Result Value Ref Range   WBC 6.5 4.0 - 10.5 K/uL   RBC 4.60 3.87 - 5.11 MIL/uL   Hemoglobin 10.9 (L) 12.0 - 15.0 g/dL   HCT 33.0 (L) 36.0 - 46.0 %   MCV 71.7 (L) 78.0 - 100.0 fL   MCH 23.7 (L) 26.0 - 34.0 pg   MCHC 33.0 30.0 - 36.0 g/dL   RDW 20.1 (H) 11.5 - 15.5 %   Platelets 439 (H) 150 - 400 K/uL   Dilaudid 1 mg IV; LR 500 cc bolus  0610 Pt reassessed; decreased bleeding and reports pain has decreased   Assessment and Plan  Spontaneous Abortion  Plan: Reviewed bleeding precautions Tissue to pathology RX Ibuprofen 600 TID prn Tylenol #3 (10) Follow-up in two weeks   Fleda Pagel N Karim, CNM 10/04/2016 6:15 AM

## 2016-10-05 LAB — URINE CULTURE

## 2016-10-06 ENCOUNTER — Telehealth (HOSPITAL_BASED_OUTPATIENT_CLINIC_OR_DEPARTMENT_OTHER): Payer: Self-pay | Admitting: Emergency Medicine

## 2016-10-06 NOTE — Telephone Encounter (Signed)
Post ED Visit - Positive Culture Follow-up  Culture report reviewed by antimicrobial stewardship pharmacist:  []  Elenor Quinones, Pharm.D. []  Heide Guile, Pharm.D., BCPS []  Parks Neptune, Pharm.D. []  Alycia Rossetti, Pharm.D., BCPS []  [place], Pharm.D., BCPS, AAHIVP []  Legrand Como, Pharm.D., BCPS, AAHIVP []  Milus Glazier, Pharm.D. []  Stephens November, [place].D. Deirdre Pippins Pharm D  Positive urine culture Treated with Keflex, organism sensitive to the same and no further patient follow-up is required at this time.  Hazle Nordmann 10/06/2016, 11:04 AM

## 2016-10-12 ENCOUNTER — Telehealth: Payer: Self-pay | Admitting: General Practice

## 2016-10-12 DIAGNOSIS — Z3490 Encounter for supervision of normal pregnancy, unspecified, unspecified trimester: Secondary | ICD-10-CM

## 2016-10-12 NOTE — Telephone Encounter (Signed)
Per Dr Roselie Awkward, patient needs ultrasound in 10-14 days to confirm viability. Ultrasound scheduled 11/30 @ 10am. Called patient, no answer. Will send mychart message

## 2016-10-21 ENCOUNTER — Ambulatory Visit (HOSPITAL_COMMUNITY): Payer: Medicaid Other | Attending: Obstetrics & Gynecology

## 2016-10-22 ENCOUNTER — Telehealth: Payer: Self-pay | Admitting: Obstetrics & Gynecology

## 2016-10-22 NOTE — Telephone Encounter (Signed)
Called number listed in Epic. Message stated to call [phone]. When I called that number a young man answered the phone and stated Jeff was not with him at this time. I asked him if he could give her a message to call us, and he stated he would tell her to call us. Patient also did not go to [place] appointment, and her address is listed as [place].

## 2017-08-09 ENCOUNTER — Encounter (HOSPITAL_COMMUNITY): Payer: Self-pay

## 2018-03-23 ENCOUNTER — Emergency Department (HOSPITAL_COMMUNITY)
Admission: EM | Admit: 2018-03-23 | Discharge: 2018-03-23 | Disposition: A | Discharge status: Home / Self Care | Payer: Medicaid Other | Attending: Emergency Medicine | Admitting: Emergency Medicine

## 2018-03-23 ENCOUNTER — Encounter (HOSPITAL_COMMUNITY): Payer: Self-pay

## 2018-03-23 DIAGNOSIS — N898 Other specified noninflammatory disorders of vagina: Secondary | ICD-10-CM | POA: Insufficient documentation

## 2018-03-23 DIAGNOSIS — Z79899 Other long term (current) drug therapy: Secondary | ICD-10-CM | POA: Insufficient documentation

## 2018-03-23 DIAGNOSIS — Z87891 Personal history of nicotine dependence: Secondary | ICD-10-CM | POA: Diagnosis not present

## 2018-03-23 LAB — WET PREP, GENITAL
Sperm: NONE SEEN
Trich, Wet Prep: NONE SEEN
Yeast Wet Prep HPF POC: NONE SEEN

## 2018-03-23 LAB — POC URINE PREG, ED: Preg Test, Ur: NEGATIVE

## 2018-03-23 LAB — URINALYSIS, ROUTINE W REFLEX MICROSCOPIC
Bilirubin Urine: NEGATIVE
GLUCOSE, UA: NEGATIVE mg/dL
HGB URINE DIPSTICK: NEGATIVE
Ketones, ur: 5 mg/dL — AB
Leukocytes, UA: NEGATIVE
Nitrite: NEGATIVE
PROTEIN: NEGATIVE mg/dL
Specific Gravity, Urine: 1.027 (ref 1.005–1.030)
pH: 6 (ref 5.0–8.0)

## 2018-03-23 MED ORDER — STERILE WATER FOR INJECTION IJ SOLN
INTRAMUSCULAR | Status: AC
  Administered 2018-03-23: 0.9 mL via INTRAMUSCULAR
  Filled 2018-03-23: qty 10

## 2018-03-23 MED ORDER — METRONIDAZOLE 500 MG PO TABS: 500.0000 mg | ORAL_TABLET | Freq: Two times a day (BID) | ORAL | 0 refills | Status: DC

## 2018-03-23 MED ORDER — AZITHROMYCIN 250 MG PO TABS
1000.0000 mg | ORAL_TABLET | Freq: Once | ORAL | Status: AC
  Administered 2018-03-23: 1000 mg via ORAL
  Filled 2018-03-23: qty 4

## 2018-03-23 MED ORDER — STERILE WATER FOR INJECTION IJ SOLN
0.9000 mL | Freq: Once | INTRAMUSCULAR | Status: AC
  Administered 2018-03-23: 0.9 mL via INTRAMUSCULAR

## 2018-03-23 MED ORDER — CEFTRIAXONE SODIUM 250 MG IJ SOLR
250.0000 mg | Freq: Once | INTRAMUSCULAR | Status: AC
  Administered 2018-03-23: 250 mg via INTRAMUSCULAR
  Filled 2018-03-23: qty 250

## 2018-03-23 NOTE — ED Triage Notes (Signed)
Patient complains of recent diagnosis of GC and took meds as given at [facility]. States that she thinks she has been re-exposed. NAD
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
## 2018-03-23 NOTE — Discharge Instructions (Addendum)
Please read attached information. If you experience any new or worsening signs or symptoms please return to the emergency room for evaluation. Please follow-up with your primary care provider or specialist as discussed. Please use medication prescribed only as directed and discontinue taking if you have any concerning signs or symptoms.   °

## 2018-03-24 LAB — GC/CHLAMYDIA PROBE AMP (~~LOC~~) NOT AT ARMC
Chlamydia: NEGATIVE
Neisseria Gonorrhea: NEGATIVE

## 2018-05-18 ENCOUNTER — Ambulatory Visit (INDEPENDENT_AMBULATORY_CARE_PROVIDER_SITE_OTHER): Payer: Medicaid Other

## 2018-05-18 VITALS — BP 141/81 | HR 70 | Wt 130.0 lb

## 2018-05-18 DIAGNOSIS — N912 Amenorrhea, unspecified: Secondary | ICD-10-CM

## 2018-05-18 DIAGNOSIS — Z3201 Encounter for pregnancy test, result positive: Secondary | ICD-10-CM

## 2018-05-18 LAB — POCT PREGNANCY, URINE: Preg Test, Ur: POSITIVE — AB

## 2018-05-18 MED ORDER — PRENATAL MULTIVITAMIN CH: 1.0000 | ORAL_TABLET | Freq: Every day | ORAL | 3 refills | Status: DC

## 2018-05-18 NOTE — Progress Notes (Signed)
Patient here for pregnancy test.  Test is positive.  LMP 03/17/2018 EDD 12/25/2018.  Prenatal vitamins sent to pharmacy.  Patient will seek prenatal care.

## 2018-05-19 ENCOUNTER — Other Ambulatory Visit: Payer: Self-pay

## 2018-05-19 DIAGNOSIS — N912 Amenorrhea, unspecified: Secondary | ICD-10-CM

## 2018-05-19 DIAGNOSIS — Z3402 Encounter for supervision of normal first pregnancy, second trimester: Secondary | ICD-10-CM

## 2018-05-19 MED ORDER — PRENATAL MULTIVITAMIN CH: 1.0000 | ORAL_TABLET | Freq: Every day | ORAL | 3 refills | Status: DC

## 2018-05-23 ENCOUNTER — Inpatient Hospital Stay (HOSPITAL_COMMUNITY)
Admission: AD | Admit: 2018-05-23 | Discharge: 2018-05-23 | Disposition: A | Discharge status: Home / Self Care | Payer: Medicaid Other | Source: Ambulatory Visit | Attending: Obstetrics and Gynecology | Admitting: Obstetrics and Gynecology

## 2018-05-23 ENCOUNTER — Other Ambulatory Visit: Payer: Self-pay

## 2018-05-23 ENCOUNTER — Encounter (HOSPITAL_COMMUNITY): Payer: Self-pay | Admitting: *Deleted

## 2018-05-23 DIAGNOSIS — Z8759 Personal history of other complications of pregnancy, childbirth and the puerperium: Secondary | ICD-10-CM

## 2018-05-23 DIAGNOSIS — S01511A Laceration without foreign body of lip, initial encounter: Secondary | ICD-10-CM | POA: Insufficient documentation

## 2018-05-23 DIAGNOSIS — O9A211 Injury, poisoning and certain other consequences of external causes complicating pregnancy, first trimester: Secondary | ICD-10-CM

## 2018-05-23 DIAGNOSIS — F329 Major depressive disorder, single episode, unspecified: Secondary | ICD-10-CM | POA: Insufficient documentation

## 2018-05-23 DIAGNOSIS — Z3491 Encounter for supervision of normal pregnancy, unspecified, first trimester: Secondary | ICD-10-CM

## 2018-05-23 DIAGNOSIS — Z3A09 9 weeks gestation of pregnancy: Secondary | ICD-10-CM

## 2018-05-23 DIAGNOSIS — O99341 Other mental disorders complicating pregnancy, first trimester: Secondary | ICD-10-CM | POA: Insufficient documentation

## 2018-05-23 DIAGNOSIS — Z87891 Personal history of nicotine dependence: Secondary | ICD-10-CM | POA: Diagnosis not present

## 2018-05-23 DIAGNOSIS — Z8679 Personal history of other diseases of the circulatory system: Secondary | ICD-10-CM

## 2018-05-23 DIAGNOSIS — O9A219 Injury, poisoning and certain other consequences of external causes complicating pregnancy, unspecified trimester: Secondary | ICD-10-CM | POA: Diagnosis present

## 2018-05-23 LAB — URINALYSIS, ROUTINE W REFLEX MICROSCOPIC
BILIRUBIN URINE: NEGATIVE
GLUCOSE, UA: NEGATIVE mg/dL
HGB URINE DIPSTICK: NEGATIVE
KETONES UR: NEGATIVE mg/dL
Leukocytes, UA: NEGATIVE
NITRITE: NEGATIVE
PH: 6 (ref 5.0–8.0)
Protein, ur: NEGATIVE mg/dL
SPECIFIC GRAVITY, URINE: 1.024 (ref 1.005–1.030)
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
## 2018-05-23 NOTE — MAU Note (Signed)
Rinsed laceration with saline flush. Applied saline soaked gauze to laceration.

## 2018-05-23 NOTE — Discharge Instructions (Signed)
First Trimester of Pregnancy The first trimester of pregnancy is from week 1 until the end of week 13 (months 1 through 3). A week after a sperm fertilizes an egg, the egg will implant on the wall of the uterus. This embryo will begin to develop into a baby. Genes from you and your partner will form the baby. The female genes will determine whether the baby will be a boy or a girl. At 6-8 weeks, the eyes and face will be formed, and the heartbeat can be seen on ultrasound. At the end of 12 weeks, all the baby's organs will be formed. Now that you are pregnant, you will want to do everything you can to have a healthy baby. Two of the most important things are to get good prenatal care and to follow your health care provider's instructions. Prenatal care is all the medical care you receive before the baby's birth. This care will help prevent, find, and treat any problems during the pregnancy and childbirth. Body changes during your first trimester Your body goes through many changes during pregnancy. The changes vary from woman to woman.  You may gain or lose a couple of pounds at first.  You may feel sick to your stomach (nauseous) and you may throw up (vomit). If the vomiting is uncontrollable, call your health care provider.  You may tire easily.  You may develop headaches that can be relieved by medicines. All medicines should be approved by your health care provider.  You may urinate more often. Painful urination may mean you have a bladder infection.  You may develop heartburn as a result of your pregnancy.  You may develop constipation because certain hormones are causing the muscles that push stool through your intestines to slow down.  You may develop hemorrhoids or swollen veins (varicose veins).  Your breasts may begin to grow larger and become tender. Your nipples may stick out more, and the tissue that surrounds them (areola) may become darker.  Your gums may bleed and may be  sensitive to brushing and flossing.  Dark spots or blotches (chloasma, mask of pregnancy) may develop on your face. This will likely fade after the baby is born.  Your menstrual periods will stop.  You may have a loss of appetite.  You may develop cravings for certain kinds of food.  You may have changes in your emotions from day to day, such as being excited to be pregnant or being concerned that something may go wrong with the pregnancy and baby.  You may have more vivid and strange dreams.  You may have changes in your hair. These can include thickening of your hair, rapid growth, and changes in texture. Some women also have hair loss during or after pregnancy, or hair that feels dry or thin. Your hair will most likely return to normal after your baby is born.  What to expect at prenatal visits During a routine prenatal visit:  You will be weighed to make sure you and the baby are growing normally.  Your blood pressure will be taken.  Your abdomen will be measured to track your baby's growth.  The fetal heartbeat will be listened to between weeks 10 and 14 of your pregnancy.  Test results from any previous visits will be discussed.  Your health care provider may ask you:  How you are feeling.  If you are feeling the baby move.  If you have had any abnormal symptoms, such as leaking fluid, bleeding, severe headaches,   or abdominal cramping.  If you are using any tobacco products, including cigarettes, chewing tobacco, and electronic cigarettes.  If you have any questions.  Other tests that may be performed during your first trimester include:  Blood tests to find your blood type and to check for the presence of any previous infections. The tests will also be used to check for low iron levels (anemia) and protein on red blood cells (Rh antibodies). Depending on your risk factors, or if you previously had diabetes during pregnancy, you may have tests to check for high blood  sugar that affects pregnant women (gestational diabetes).  Urine tests to check for infections, diabetes, or protein in the urine.  An ultrasound to confirm the proper growth and development of the baby.  Fetal screens for spinal cord problems (spina bifida) and Down syndrome.  HIV (human immunodeficiency virus) testing. Routine prenatal testing includes screening for HIV, unless you choose not to have this test.  You may need other tests to make sure you and the baby are doing well.  Follow these instructions at home: Medicines  Follow your health care provider's instructions regarding medicine use. Specific medicines may be either safe or unsafe to take during pregnancy.  Take a prenatal vitamin that contains at least 600 micrograms (mcg) of folic acid.  If you develop constipation, try taking a stool softener if your health care provider approves. Eating and drinking  Eat a balanced diet that includes fresh fruits and vegetables, whole grains, good sources of protein such as meat, eggs, or tofu, and low-fat dairy. Your health care provider will help you determine the amount of weight gain that is right for you.  Avoid raw meat and uncooked cheese. These carry germs that can cause birth defects in the baby.  Eating four or five small meals rather than three large meals a day may help relieve nausea and vomiting. If you start to feel nauseous, eating a few soda crackers can be helpful. Drinking liquids between meals, instead of during meals, also seems to help ease nausea and vomiting.  Limit foods that are high in fat and processed sugars, such as fried and sweet foods.  To prevent constipation: ? Eat foods that are high in fiber, such as fresh fruits and vegetables, whole grains, and beans. ? Drink enough fluid to keep your urine clear or pale yellow. Activity  Exercise only as directed by your health care provider. Most women can continue their usual exercise routine during  pregnancy. Try to exercise for 30 minutes at least 5 days a week. Exercising will help you: ? Control your weight. ? Stay in shape. ? Be prepared for labor and delivery.  Experiencing pain or cramping in the lower abdomen or lower back is a good sign that you should stop exercising. Check with your health care provider before continuing with normal exercises.  Try to avoid standing for long periods of time. Move your legs often if you must stand in one place for a long time.  Avoid heavy lifting.  Wear low-heeled shoes and practice good posture.  You may continue to have sex unless your health care provider tells you not to. Relieving pain and discomfort  Wear a good support bra to relieve breast tenderness.  Take warm sitz baths to soothe any pain or discomfort caused by hemorrhoids. Use hemorrhoid cream if your health care provider approves.  Rest with your legs elevated if you have leg cramps or low back pain.  If you develop   varicose veins in your legs, wear support hose. Elevate your feet for 15 minutes, 3-4 times a day. Limit salt in your diet. Prenatal care  Schedule your prenatal visits by the twelfth week of pregnancy. They are usually scheduled monthly at first, then more often in the last 2 months before delivery.  Write down your questions. Take them to your prenatal visits.  Keep all your prenatal visits as told by your health care provider. This is important. Safety  Wear your seat belt at all times when driving.  Make a list of emergency phone numbers, including numbers for family, friends, the hospital, and police and fire departments. General instructions  Ask your health care provider for a referral to a local prenatal education class. Begin classes no later than the beginning of month 6 of your pregnancy.  Ask for help if you have counseling or nutritional needs during pregnancy. Your health care provider can offer advice or refer you to specialists for help  with various needs.  Do not use hot tubs, steam rooms, or saunas.  Do not douche or use tampons or scented sanitary pads.  Do not cross your legs for long periods of time.  Avoid cat litter boxes and soil used by cats. These carry germs that can cause birth defects in the baby and possibly loss of the fetus by miscarriage or stillbirth.  Avoid all smoking, herbs, alcohol, and medicines not prescribed by your health care provider. Chemicals in these products affect the formation and growth of the baby.  Do not use any products that contain nicotine or tobacco, such as cigarettes and e-cigarettes. If you need help quitting, ask your health care provider. You may receive counseling support and other resources to help you quit.  Schedule a dentist appointment. At home, brush your teeth with a soft toothbrush and be gentle when you floss. Contact a health care provider if:  You have dizziness.  You have mild pelvic cramps, pelvic pressure, or nagging pain in the abdominal area.  You have persistent nausea, vomiting, or diarrhea.  You have a bad smelling vaginal discharge.  You have pain when you urinate.  You notice increased swelling in your face, hands, legs, or ankles.  You are exposed to fifth disease or chickenpox.  You are exposed to German measles (rubella) and have never had it. Get help right away if:  You have a fever.  You are leaking fluid from your vagina.  You have spotting or bleeding from your vagina.  You have severe abdominal cramping or pain.  You have rapid weight gain or loss.  You vomit blood or material that looks like coffee grounds.  You develop a severe headache.  You have shortness of breath.  You have any kind of trauma, such as from a fall or a car accident. Summary  The first trimester of pregnancy is from week 1 until the end of week 13 (months 1 through 3).  Your body goes through many changes during pregnancy. The changes vary from  woman to woman.  You will have routine prenatal visits. During those visits, your health care provider will examine you, discuss any test results you may have, and talk with you about how you are feeling. This information is not intended to replace advice given to you by your health care provider. Make sure you discuss any questions you have with your health care provider. Document Released: 11/02/2001 Document Revised: 10/20/2016 Document Reviewed: 10/20/2016 Elsevier Interactive Patient Education  2018 [employer]   Inc[employer]  
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
## 2018-05-24 DIAGNOSIS — Z8679 Personal history of other diseases of the circulatory system: Secondary | ICD-10-CM

## 2018-05-24 DIAGNOSIS — Z8759 Personal history of other complications of pregnancy, childbirth and the puerperium: Secondary | ICD-10-CM

## 2018-05-24 DIAGNOSIS — O9A219 Injury, poisoning and certain other consequences of external causes complicating pregnancy, unspecified trimester: Secondary | ICD-10-CM | POA: Diagnosis present

## 2018-05-24 HISTORY — DX: Personal history of other diseases of the circulatory system: Z86.79

## 2018-05-24 HISTORY — DX: Personal history of other diseases of the circulatory system: Z87.59

## 2018-05-30 ENCOUNTER — Ambulatory Visit (HOSPITAL_COMMUNITY)
Admission: RE | Admit: 2018-05-30 | Discharge: 2018-05-30 | Disposition: A | Discharge status: Home / Self Care | Payer: Medicaid Other | Source: Ambulatory Visit | Attending: Advanced Practice Midwife | Admitting: Advanced Practice Midwife

## 2018-05-30 ENCOUNTER — Ambulatory Visit (INDEPENDENT_AMBULATORY_CARE_PROVIDER_SITE_OTHER): Payer: Medicaid Other

## 2018-05-30 DIAGNOSIS — S01511A Laceration without foreign body of lip, initial encounter: Secondary | ICD-10-CM

## 2018-05-30 DIAGNOSIS — Z3A10 10 weeks gestation of pregnancy: Secondary | ICD-10-CM | POA: Diagnosis not present

## 2018-05-30 DIAGNOSIS — Z3A09 9 weeks gestation of pregnancy: Secondary | ICD-10-CM

## 2018-05-30 DIAGNOSIS — Z712 Person consulting for explanation of examination or test findings: Secondary | ICD-10-CM

## 2018-05-30 DIAGNOSIS — O9A211 Injury, poisoning and certain other consequences of external causes complicating pregnancy, first trimester: Secondary | ICD-10-CM | POA: Diagnosis present

## 2018-05-30 DIAGNOSIS — X58XXXA Exposure to other specified factors, initial encounter: Secondary | ICD-10-CM | POA: Diagnosis not present

## 2018-05-30 DIAGNOSIS — Z3491 Encounter for supervision of normal pregnancy, unspecified, first trimester: Secondary | ICD-10-CM

## 2018-05-30 NOTE — Progress Notes (Signed)
Pt here today for OB US results.  Pt's US shows viable pregnancy.  Pt denies any sx at this time.  Pt advised to obtain proof of pregnancy letter to start prenatal care.

## 2018-06-22 ENCOUNTER — Encounter: Payer: Self-pay | Admitting: Advanced Practice Midwife

## 2018-06-22 ENCOUNTER — Encounter: Payer: Self-pay | Admitting: General Practice

## 2018-06-22 ENCOUNTER — Other Ambulatory Visit: Payer: Self-pay

## 2018-06-22 ENCOUNTER — Other Ambulatory Visit (HOSPITAL_COMMUNITY)
Admission: RE | Admit: 2018-06-22 | Discharge: 2018-06-22 | Disposition: A | Discharge status: Home / Self Care | Payer: Medicaid Other | Source: Ambulatory Visit | Attending: Advanced Practice Midwife | Admitting: Advanced Practice Midwife

## 2018-06-22 ENCOUNTER — Ambulatory Visit (INDEPENDENT_AMBULATORY_CARE_PROVIDER_SITE_OTHER): Payer: Medicaid Other | Admitting: Advanced Practice Midwife

## 2018-06-22 VITALS — BP 130/73 | HR 90 | Wt 138.0 lb

## 2018-06-22 DIAGNOSIS — Z3482 Encounter for supervision of other normal pregnancy, second trimester: Secondary | ICD-10-CM | POA: Insufficient documentation

## 2018-06-22 DIAGNOSIS — Z8679 Personal history of other diseases of the circulatory system: Secondary | ICD-10-CM

## 2018-06-22 DIAGNOSIS — Z8759 Personal history of other complications of pregnancy, childbirth and the puerperium: Secondary | ICD-10-CM

## 2018-06-22 DIAGNOSIS — Z3480 Encounter for supervision of other normal pregnancy, unspecified trimester: Secondary | ICD-10-CM | POA: Insufficient documentation

## 2018-06-22 MED ORDER — ASPIRIN EC 81 MG PO TBEC: 81.0000 mg | DELAYED_RELEASE_TABLET | Freq: Every day | ORAL | 11 refills | Status: DC

## 2018-06-22 NOTE — Patient Instructions (Signed)
Safe Medications in Pregnancy   Acne: Benzoyl Peroxide Salicylic Acid  Backache/Headache: Tylenol: 2 regular strength every 4 hours OR              2 Extra strength every 6 hours  Colds/Coughs/Allergies: Benadryl (alcohol free) 25 mg every 6 hours as needed Breath right strips Claritin Cepacol throat lozenges Chloraseptic throat spray Cold-Eeze- up to three times per day Cough drops, alcohol free Flonase (by prescription only) Guaifenesin Mucinex Robitussin DM (plain only, alcohol free) Saline nasal spray/drops Sudafed (pseudoephedrine) & Actifed ** use only after [redacted] weeks gestation and if you do not have high blood pressure Tylenol Vicks Vaporub Zinc lozenges Zyrtec   Constipation: Colace Ducolax suppositories Fleet enema Glycerin suppositories Metamucil Milk of magnesia Miralax Senokot Smooth move tea  Diarrhea: Kaopectate Imodium A-D  *NO pepto Bismol  Hemorrhoids: Anusol Anusol HC Preparation H Tucks  Indigestion: Tums Maalox Mylanta Zantac  Pepcid  Insomnia: Benadryl (alcohol free) 25mg every 6 hours as needed Tylenol PM Unisom, no Gelcaps  Leg Cramps: Tums MagGel  Nausea/Vomiting:  Bonine Dramamine Emetrol Ginger extract Sea bands Meclizine  Nausea medication to take during pregnancy:  Unisom (doxylamine succinate 25 mg tablets) Take one tablet daily at bedtime. If symptoms are not adequately controlled, the dose can be increased to a maximum recommended dose of two tablets daily (1/2 tablet in the morning, 1/2 tablet mid-afternoon and one at bedtime). Vitamin B6 100mg tablets. Take one tablet twice a day (up to 200 mg per day).  Skin Rashes: Aveeno products Benadryl cream or 25mg every 6 hours as needed Calamine Lotion 1% cortisone cream  Yeast infection: Gyne-lotrimin 7 Monistat 7   **If taking multiple medications, please check labels to avoid duplicating the same active ingredients **take medication as directed on  the label ** Do not exceed 4000 mg of tylenol in 24 hours **Do not take medications that contain aspirin or ibuprofen   Childbirth Education Options: [facility] Classes:  Childbirth education classes can help you get ready for a positive parenting experience. You can also meet other expectant parents and get free stuff for your baby. Each class runs for five weeks on the same night and costs $45 for the mother-to-be and her support person. Medicaid covers the cost if you are eligible. Call [phone] to register. [facility] Childbirth Education:  [phone] or [phone] or [email or web address]  Baby & Me Class: Discuss newborn & infant parenting and family adjustment issues with other new mothers in a relaxed environment. Each week brings a new speaker or baby-centered activity. We encourage new mothers to join us every Thursday at 11:00am. Babies birth until crawling. No registration or fee. Daddy Boot Camp: This course offers Dads-to-be the tools and knowledge needed to feel confident on their journey to becoming new fathers. Experienced dads, who have been trained as coaches, teach dads-to-be how to hold, comfort, diaper, swaddle and play with their infant while being able to support the new mom as well. A class for men taught by men. $25/dad Big Brother/Big Sister: Let your children share in the joy of a new brother or sister in this special class designed just for them. Class includes discussion about how families care for babies: swaddling, holding, diapering, safety as well as how they can be helpful in their new role. This class is designed for children ages 2 to 6, but any age is welcome. Please register each child individually. $5/child  Mom Talk: This mom-led group offers support and connection to   mothers as they journey through the adjustments and struggles of that sometimes overwhelming first year after the birth of a child. Tuesdays at 10:00am and  Thursdays at 6:00pm. Babies welcome. No registration or fee. Breastfeeding Support Group: This group is a mother-to-mother support circle where moms have the opportunity to share their breastfeeding experiences. A Lactation Consultant is present for questions and concerns. Meets each Tuesday at 11:00am. No fee or registration. Breastfeeding Your Baby: Learn what to expect in the first days of breastfeeding your newborn.  This class will help you feel more confident with the skills needed to begin your breastfeeding experience. Many new mothers are concerned about breastfeeding after leaving the hospital. This class will also address the most common fears and challenges about breastfeeding during the first few weeks, months and beyond. (call for fee) Comfort Techniques and Tour: This 2 hour interactive class will provide you the opportunity to learn & practice hands-on techniques that can help relieve some of the discomfort of labor and encourage your baby to rotate toward the best position for birth. You and your partner will be able to try a variety of labor positions with birth balls and rebozos as well as practice breathing, relaxation, and visualization techniques. A tour of the [facility] is included with this class. $20 per registrant and support person Childbirth Class- Weekend Option: This class is a Weekend version of our Birth & Baby series. It is designed for parents who have a difficult time fitting several weeks of classes into their schedule. It covers the care of your newborn and the basics of labor and childbirth. It also includes a Maternity Care Center Tour of [facility] and lunch. The class is held two consecutive days: beginning on Friday evening from 6:30 - 8:30 p.m. and the next day, Saturday from 9 a.m. - 4 p.m. (call for fee) Waterbirth Class: Interested in a waterbirth?  This informational class will help you discover whether waterbirth is the right fit  for you. Education about waterbirth itself, supplies you would need and how to assemble your support team is what you can expect from this class. Some obstetrical practices require this class in order to pursue a waterbirth. (Not all obstetrical practices offer waterbirth-check with your healthcare provider.) Register only the expectant mom, but you are encouraged to bring your partner to class! Required if planning waterbirth, no fee. Infant/Child CPR: Parents, grandparents, babysitters, and friends learn Cardio-Pulmonary Resuscitation skills for infants and children. You will also learn how to treat both conscious and unconscious choking in infants and children. This Family & Friends program does not offer certification. Register each participant individually to ensure that enough mannequins are available. (Call for fee) Grandparent Love: Expecting a grandbaby? This class is for you! Learn about the latest infant care and safety recommendations and ways to support your own child as he or she transitions into the parenting role. Taught by Registered Nurses who are childbirth instructors, but most importantly...they are grandmothers too! $10/person. Childbirth Class- Natural Childbirth: This series of 5 weekly classes is for expectant parents who want to learn and practice natural methods of coping with the process of labor and childbirth. Relaxation, breathing, massage, visualization, role of the partner, and helpful positioning are highlighted. Participants learn how to be confident in their body's ability to give birth. This class will empower and help parents make informed decisions about their own care. Includes discussion that will help new parents transition into the immediate postpartum period. Maternity   [place] [facility] is included. We suggest taking this class between 25-32 weeks, but it's only a recommendation. $75 per registrant and one support person or $30 Medicaid. Childbirth  Class- 3 week Series: This option of 3 weekly classes helps you and your labor partner prepare for childbirth. Newborn care, labor & birth, cesarean birth, pain management, and comfort techniques are discussed and a [place] [facility] is included. The class meets at the same time, on the same day of the week for 3 consecutive weeks beginning with the starting date you choose. $60 for registrant and one support person.  Marvelous Multiples: Expecting twins, triplets, or more? This class covers the differences in labor, birth, parenting, and breastfeeding issues that face multiples' parents. NICU tour is included. Led by a Certified Childbirth Educator who is the mother of twins. No fee. Caring for Baby: This class is for expectant and adoptive parents who want to learn and practice the most up-to-date newborn care for their babies. Focus is on birth through the first six weeks of life. Topics include feeding, bathing, diapering, crying, umbilical cord care, circumcision care and safe sleep. Parents learn to recognize symptoms of illness and when to call the pediatrician. Register only the mom-to-be and your partner or support person can plan to come with you! $10 per registrant and support person Childbirth Class- online option: This online class offers you the freedom to complete a Birth and Baby series in the comfort of your own home. The flexibility of this option allows you to review sections at your own pace, at times convenient to you and your support people. It includes additional video information, animations, quizzes, and extended activities. Get organized with helpful eClass tools, checklists, and trackers. Once you register online for the class, you will receive an email within a few days to accept the invitation and begin the class when the time is right for you. The content will be available to you for 60 days. $60 for 60 days of online access for you and your support  people.  Local Doulas: Natural Baby Doulas [email or web address] Tel: [phone] [email or web address] [employer] [phone] [email or web address] The Labor Hassell Halim  (also do waterbirth tub rental) [phone] [email or web address] [facility] Birth Doula [phone] [email or web address] Sacred Rhythms  [phone] [email or web address]-[email or web address] [employer] (PADA) [email or web address] [place] Birth and Baby  [email or web address] Considering Waterbirth? Guide for patients at [facility] [employer]  Why consider waterbirth?  . Gentle birth for babies . Less pain medicine used in labor . May allow for passive descent/less pushing . May reduce perineal tears  . More mobility and instinctive maternal position changes . Increased maternal relaxation . Reduced blood pressure in labor  Is waterbirth safe? What are the risks of infection, drowning or other complications?  . Infection: o Very low risk (3.7 % for tub vs 4.8% for bed) o 7 in 8000 waterbirths with documented infection o Poorly cleaned equipment most common cause o Slightly lower group B strep transmission rate  . Drowning o Maternal:  - Very low risk   - Related to seizures or fainting o Newborn:  - Very low risk. No evidence of increased risk of respiratory problems in multiple large studies - Physiological protection from breathing under water - Avoid underwater birth if there are any fetal complications - Once baby's head is out of the water, keep it out.  . Birth complication o Some reports of cord trauma, but risk decreased by  bringing baby to surface gradually o No evidence of increased risk of shoulder dystocia. Mothers can usually change positions faster in water than in a bed, possibly aiding the  maneuvers to free the shoulder.   You must attend a Doren Custard class at [facility]  3rd Wednesday of every month from 7-9pm  [employer] by calling [phone] or online at [email or web address]  Bring [place] the certificate from the class to your prenatal appointment  Meet with a midwife at 36 weeks to see if you can still plan a waterbirth and to sign the consent.   Purchase or rent the following supplies:   Water Birth Pool (Birth Pool in a Box or [place] for instance)  (Tubs start ~$125)  Single-use disposable tub liner designed for your brand of tub  New garden hose labeled "lead-free", "suitable for drinking water",  Electric drain pump to remove water (We recommend 792 gallon per hour or greater pump.)   Separate garden hose to remove the dirty water  Fish net  Bathing suit top (optional)  Long-handled mirror (optional)  Places to purchase or rent supplies  GotWebTools.is for tub purchases and supplies  Waterbirthsolutions.com for tub purchases and supplies  The Labor Ladies (www.[email or web address]) $275 for tub rental/set-up & take down/kit   [employer] (http://www.[place]ming.com/.htm) Information regarding doulas (labor support) who provide pool rentals  Our practice has a Birth Pool in a Box tub at the hospital that you may borrow on a first-come-first-served basis. It is your responsibility to to set up, clean and break down the tub. We cannot guarantee the availability of this tub in advance. You are responsible for bringing all accessories listed above. If you do not have all necessary supplies you cannot have a waterbirth.    Things that would prevent you from having a waterbirth:  Premature, <37wks  Previous cesarean birth  Presence of thick meconium-stained fluid  Multiple gestation (Twins, triplets, etc.)  Uncontrolled diabetes or gestational diabetes requiring medication  Hypertension requiring medication  or diagnosis of pre-eclampsia  Heavy vaginal bleeding  Non-reassuring fetal heart rate  Active infection (MRSA, etc.). Group B Strep is NOT a contraindication for  waterbirth.  If your labor has to be induced and induction method requires continuous  monitoring of the baby's heart rate  Other risks/issues identified by your obstetrical provider  Please remember that birth is unpredictable. Under certain unforeseeable circumstances your provider may advise against giving birth in the tub. These decisions will be made on a case-by-case basis and with the safety of you and your baby as our highest priority.

## 2018-06-22 NOTE — Progress Notes (Signed)
Subjective:   Michelle Greer is a 22 y.o. G3P1011 at [redacted]w[redacted]d by LMP, early ultrasound being seen today for her first obstetrical visit.  Her obstetrical history is significant for pre-eclampsia. Patient does intend to breast feed. Pregnancy history fully reviewed.  Patient reports backache and vaginal discharge .  HISTORY: OB History  Gravida Para Term Preterm AB Living  3 1 1  0 1 1  SAB TAB Ectopic Multiple Live Births  1 0 0 0 1    # Outcome Date GA Lbr Len/2nd Weight Sex Delivery Anes PTL Lv  3 Current           2 Term 07/16/13 [redacted]w[redacted]d 13:15 / 00:53 6 lb 6.4 oz (2.903 kg) M Vag-Spont EPI  LIV     Name: Michelle Greer     Apgar1: 9  Apgar5: 9  1 SAB             Patient has not had a pap smear.    Past Medical History:  Diagnosis Date  . Anemia   . Bronchitis   . Bronchitis, acute   . Depression   . Irregular menstrual bleeding   . Trichomonosis 2016   Past Surgical History:  Procedure Laterality Date  . WISDOM TOOTH EXTRACTION     Family History  Problem Relation Age of Onset  . Asthma Mother    Social History   Tobacco Use  . Smoking status: Former Research scientist (life sciences)  . Smokeless tobacco: Never Used  Substance Use Topics  . Alcohol use: No  . Drug use: No   Allergies  Allergen Reactions  . Tomato Hives   Current Outpatient Medications on File Prior to Visit  Medication Sig Dispense Refill  . Prenatal Vit-Fe Fumarate-FA (PRENATAL MULTIVITAMIN) TABS tablet Take 1 tablet by mouth daily at 12 noon. 90 tablet 3   No current facility-administered medications on file prior to visit.     Review of Systems Pertinent items noted in HPI and remainder of comprehensive ROS otherwise negative.  Exam   Vitals:   06/22/18 0931  BP: 130/73  Pulse: 90  Weight: 138 lb (62.6 kg)   Fetal Heart Rate (bpm): 156  Uterus:     Pelvic Exam: Perineum: no hemorrhoids, normal perineum   Vulva: normal external genitalia, no lesions   Vagina:  normal mucosa, normal discharge   Cervix:  no lesions and normal, pap smear done.    Adnexa: normal adnexa and no mass, fullness, tenderness   Bony Pelvis: average  System: General: well-developed, well-nourished female in no acute distress   Breast:  normal appearance, no masses or tenderness   Skin: normal coloration and turgor, no rashes   Neurologic: oriented, normal, negative, normal mood   Extremities: normal strength, tone, and muscle mass, ROM of all joints is normal   HEENT PERRLA, extraocular movement intact and sclera clear, anicteric   Mouth/Teeth mucous membranes moist, pharynx normal without lesions and dental hygiene good   Neck supple and no masses   Cardiovascular: regular rate and rhythm   Respiratory:  no respiratory distress, normal breath sounds   Abdomen: soft, non-tender; bowel sounds normal; no masses,  no organomegaly    Assessment:   Pregnancy: [id number] Patient Active Problem List   Diagnosis Date Noted  . Supervision of other normal pregnancy, antepartum 06/22/2018  . History of postpartum hypertension 05/24/2018     Plan:  1. Supervision of other normal pregnancy, antepartum - Routine care - Obstetric Panel, Including HIV - Genetic Screening - Cytology -  PAP - SMN1 COPY NUMBER ANALYSIS (SMA Carrier Screen) - Cystic Fibrosis Mutation 97 - Hemoglobinopathy Evaluation - Culture, OB Urine - [place] MFM OB COMP + 14 WK; Future - HgB A1c - CMET - P:cr   Start baby ASA AFP at next visit   Initial labs drawn. Continue prenatal vitamins. Genetic Screening discussed, AFP and NIPS: NIPS today, AFP will need to be done at next visit due to timing. Marland Kitchen Ultrasound discussed; fetal anatomic survey: ordered. Problem list reviewed and updated. The nature of [facility] with multiple MDs and other Advanced Practice Providers was explained to patient; also emphasized that residents, students are part of our team. Routine obstetric precautions reviewed. 50% of 45 min visit  spent in counseling and coordination of care. Return in about 1 month (around 07/20/2018).

## 2018-06-23 LAB — CYTOLOGY - PAP
Chlamydia: NEGATIVE
Diagnosis: NEGATIVE
NEISSERIA GONORRHEA: NEGATIVE

## 2018-07-03 ENCOUNTER — Encounter: Payer: Self-pay | Admitting: General Practice

## 2018-07-03 LAB — OBSTETRIC PANEL, INCLUDING HIV
Antibody Screen: NEGATIVE
BASOS ABS: 0 10*3/uL (ref 0.0–0.2)
Basos: 0 %
EOS (ABSOLUTE): 0 10*3/uL (ref 0.0–0.4)
EOS: 1 %
HEMOGLOBIN: 11.3 g/dL (ref 11.1–15.9)
HEP B S AG: NEGATIVE
HIV SCREEN 4TH GENERATION: NONREACTIVE
Hematocrit: 35.8 % (ref 34.0–46.6)
IMMATURE GRANULOCYTES: 0 %
Immature Grans (Abs): 0 10*3/uL (ref 0.0–0.1)
LYMPHS ABS: 1.6 10*3/uL (ref 0.7–3.1)
Lymphs: 28 %
MCH: 24.6 pg — AB (ref 26.6–33.0)
MCHC: 31.6 g/dL (ref 31.5–35.7)
MCV: 78 fL — AB (ref 79–97)
MONOCYTES: 9 %
Monocytes Absolute: 0.5 10*3/uL (ref 0.1–0.9)
NEUTROS ABS: 3.5 10*3/uL (ref 1.4–7.0)
Neutrophils: 62 %
PLATELETS: 428 10*3/uL (ref 150–450)
RBC: 4.6 x10E6/uL (ref 3.77–5.28)
RDW: 25.3 % — ABNORMAL HIGH (ref 12.3–15.4)
RPR: NONREACTIVE
RUBELLA: 3.34 {index} (ref >0.99)
Rh Factor: POSITIVE
WBC: 5.8 10*3/uL (ref 3.4–10.8)

## 2018-07-03 LAB — CYSTIC FIBROSIS MUTATION 97: GENE DIS ANAL CARRIER INTERP BLD/T-IMP: NOT DETECTED

## 2018-07-03 LAB — HEMOGLOBINOPATHY EVALUATION
FERRITIN: 14 ng/mL — AB (ref 15–150)
HGB A2 QUANT: 2.3 % (ref 1.8–3.2)
HGB F QUANT: 0 % (ref 0.0–2.0)
Hgb A: 97.7 % (ref 96.4–98.8)
Hgb C: 0 %
Hgb S: 0 %
Hgb Solubility: NEGATIVE
Hgb Variant: 0 %

## 2018-07-12 ENCOUNTER — Telehealth: Payer: Self-pay | Admitting: General Practice

## 2018-07-12 NOTE — Telephone Encounter (Signed)
Patient called to get blood work results.  Informed patient that message will be sent to RN and she will give her a call.  Patient verbalized understanding.

## 2018-07-17 NOTE — Telephone Encounter (Signed)
Please review pt's lab results. Pt is calling to get results.

## 2018-07-20 ENCOUNTER — Ambulatory Visit (INDEPENDENT_AMBULATORY_CARE_PROVIDER_SITE_OTHER): Payer: Medicaid Other | Admitting: Obstetrics and Gynecology

## 2018-07-20 VITALS — BP 128/83 | HR 103 | Wt 147.7 lb

## 2018-07-20 DIAGNOSIS — Z3482 Encounter for supervision of other normal pregnancy, second trimester: Secondary | ICD-10-CM

## 2018-07-20 DIAGNOSIS — Z3480 Encounter for supervision of other normal pregnancy, unspecified trimester: Secondary | ICD-10-CM

## 2018-07-20 NOTE — Progress Notes (Signed)
   PRENATAL VISIT NOTE  Subjective:  Michelle Greer is a 22 y.o. G3P1011 at [redacted]w[redacted]d being seen today for ongoing prenatal care.  She is currently monitored for the following issues for this low-risk pregnancy and has History of postpartum hypertension and Supervision of other normal pregnancy, antepartum on their problem list.  Patient reports no complaints.  Contractions: Not present. Vag. Bleeding: None.  Movement: Present. Denies leaking of fluid.   The following portions of the patient's history were reviewed and updated as appropriate: allergies, current medications, past family history, past medical history, past social history, past surgical history and problem list. Problem list updated.  Objective:   Vitals:   07/20/18 1411  BP: 128/83  Pulse: (!) 103  Weight: 147 lb 11.2 oz (67 kg)    Fetal Status: Fetal Heart Rate (bpm): 151 Fundal Height: 17 cm Movement: Present     General:  Alert, oriented and cooperative. Patient is in no acute distress.  Skin: Skin is warm and dry. No rash noted.   Cardiovascular: Normal heart rate noted  Respiratory: Normal respiratory effort, no problems with respiration noted  Abdomen: Soft, gravid, appropriate for gestational age.  Pain/Pressure: Absent     Pelvic: Cervical exam deferred        Extremities: Normal range of motion.  Edema: Trace  Mental Status: Normal mood and affect. Normal behavior. Normal judgment and thought content.   Assessment and Plan:  Pregnancy: G3P1011 at [redacted]w[redacted]d  1. Supervision of other normal pregnancy, antepartum  - Protein / creatinine ratio, urine - Comprehensive metabolic panel - HgB U1J - Culture, OB Urine - SMN1 Copy Number Analysis  Preterm labor symptoms and general obstetric precautions including but not limited to vaginal bleeding, contractions, leaking of fluid and fetal movement were reviewed in detail with the patient. Please refer to After Visit Summary for other counseling recommendations.  No  follow-ups on file.  Future Appointments  Date Time Provider [place]  07/31/2018 10:15 AM [place]    Laury Deep, CNM

## 2018-07-25 ENCOUNTER — Encounter (HOSPITAL_COMMUNITY): Payer: Self-pay

## 2018-07-31 ENCOUNTER — Ambulatory Visit (HOSPITAL_COMMUNITY)
Admission: RE | Admit: 2018-07-31 | Discharge: 2018-07-31 | Disposition: A | Discharge status: Home / Self Care | Payer: Medicaid Other | Source: Ambulatory Visit | Attending: Advanced Practice Midwife | Admitting: Advanced Practice Midwife

## 2018-07-31 DIAGNOSIS — Z3480 Encounter for supervision of other normal pregnancy, unspecified trimester: Secondary | ICD-10-CM

## 2018-07-31 DIAGNOSIS — Z363 Encounter for antenatal screening for malformations: Secondary | ICD-10-CM | POA: Diagnosis present

## 2018-07-31 DIAGNOSIS — D259 Leiomyoma of uterus, unspecified: Secondary | ICD-10-CM | POA: Diagnosis not present

## 2018-07-31 DIAGNOSIS — O3412 Maternal care for benign tumor of corpus uteri, second trimester: Secondary | ICD-10-CM | POA: Diagnosis not present

## 2018-07-31 DIAGNOSIS — Z3A19 19 weeks gestation of pregnancy: Secondary | ICD-10-CM | POA: Insufficient documentation

## 2018-08-17 ENCOUNTER — Encounter: Payer: Medicaid Other | Admitting: Obstetrics and Gynecology

## 2018-08-24 ENCOUNTER — Ambulatory Visit (INDEPENDENT_AMBULATORY_CARE_PROVIDER_SITE_OTHER): Payer: Medicaid Other | Admitting: Obstetrics and Gynecology

## 2018-08-24 ENCOUNTER — Other Ambulatory Visit: Payer: Self-pay

## 2018-08-24 ENCOUNTER — Encounter: Payer: Self-pay | Admitting: Obstetrics and Gynecology

## 2018-08-24 ENCOUNTER — Other Ambulatory Visit (HOSPITAL_COMMUNITY)
Admission: RE | Admit: 2018-08-24 | Discharge: 2018-08-24 | Disposition: A | Discharge status: Home / Self Care | Payer: Medicaid Other | Source: Ambulatory Visit | Attending: Obstetrics and Gynecology | Admitting: Obstetrics and Gynecology

## 2018-08-24 VITALS — BP 121/71 | HR 86 | Wt 159.0 lb

## 2018-08-24 DIAGNOSIS — O26892 Other specified pregnancy related conditions, second trimester: Secondary | ICD-10-CM

## 2018-08-24 DIAGNOSIS — Z3480 Encounter for supervision of other normal pregnancy, unspecified trimester: Secondary | ICD-10-CM

## 2018-08-24 DIAGNOSIS — Z23 Encounter for immunization: Secondary | ICD-10-CM | POA: Diagnosis not present

## 2018-08-24 DIAGNOSIS — N898 Other specified noninflammatory disorders of vagina: Secondary | ICD-10-CM | POA: Diagnosis present

## 2018-08-24 DIAGNOSIS — Z3482 Encounter for supervision of other normal pregnancy, second trimester: Secondary | ICD-10-CM

## 2018-08-24 DIAGNOSIS — Z8759 Personal history of other complications of pregnancy, childbirth and the puerperium: Secondary | ICD-10-CM

## 2018-08-24 DIAGNOSIS — Z8679 Personal history of other diseases of the circulatory system: Secondary | ICD-10-CM

## 2018-08-24 MED ORDER — ASPIRIN 81 MG PO CHEW: 81.0000 mg | CHEWABLE_TABLET | Freq: Every day | ORAL | 3 refills | Status: DC

## 2018-08-24 MED ORDER — PRENATAL ONE DAILY 27-0.8 MG PO TABS: 1.0000 | ORAL_TABLET | Freq: Every day | ORAL | 0 refills | Status: DC

## 2018-08-24 NOTE — Progress Notes (Signed)
   PRENATAL VISIT NOTE  Subjective:  Michelle Greer is a 22 y.o. G3P1011 at 71w3dbeing seen today for ongoing prenatal care.  She is currently monitored for the following issues for this low-risk pregnancy and has History of postpartum hypertension and Supervision of other normal pregnancy, antepartum on their problem list.  Patient reports vaginal odor.  Contractions: Irritability. Vag. Bleeding: None.  Movement: Present. Denies leaking of fluid.   The following portions of the patient's history were reviewed and updated as appropriate: allergies, current medications, past family history, past medical history, past social history, past surgical history and problem list. Problem list updated.  Objective:   Vitals:   08/24/18 1408  BP: 121/71  Pulse: 86  Weight: 159 lb (72.1 kg)    Fetal Status: Fetal Heart Rate (bpm): 150 Fundal Height: 22 cm Movement: Present  Presentation: Undeterminable  General:  Alert, oriented and cooperative. Patient is in no acute distress.  Skin: Skin is warm and dry. No rash noted.   Cardiovascular: Normal heart rate noted  Respiratory: Normal respiratory effort, no problems with respiration noted  Abdomen: Soft, gravid, appropriate for gestational age.  Pain/Pressure: Present     Pelvic: Cervical exam performed Dilation: Closed Effacement (%): Thick Station: Ballotable  Extremities: Normal range of motion.  Edema: None  Mental Status: Normal mood and affect. Normal behavior. Normal judgment and thought content.   Assessment and Plan:  Pregnancy: G3P1011 at 219w3d1. Vaginal odor  - Cervicovaginal ancillary only  2. Need for immunization against influenza  - Flu Vaccine QUAD 36+ mos IM  3. Supervision of other normal pregnancy, antepartum  - AFP TETRA today  4. History of postpartum hypertension - Protein / creatinine ratio, urine, Comp Met (CMET), Culture, OB Urine, Hemoglobin A1c - Not taking baby ASA daily // Rx printed to have filled //  Explained importance of taking baby ASA daily  Preterm labor symptoms and general obstetric precautions including but not limited to vaginal bleeding, contractions, leaking of fluid and fetal movement were reviewed in detail with the patient. Please refer to After Visit Summary for other counseling recommendations.  Return in about 4 weeks (around 09/21/2018) for Return OB visit.  RoLaury DeepCNM

## 2018-08-24 NOTE — Patient Instructions (Signed)
Second Trimester of Pregnancy The second trimester is from week 13 through week 28, month 4 through 6. This is often the time in pregnancy that you feel your best. Often times, morning sickness has lessened or quit. You may have more energy, and you may get hungry more often. Your unborn baby (fetus) is growing rapidly. At the end of the sixth month, he or she is about 9 inches long and weighs about 1 pounds. You will likely feel the baby move (quickening) between 18 and 20 weeks of pregnancy. Follow these instructions at home:  Avoid all smoking, herbs, and alcohol. Avoid drugs not approved by your doctor.  Do not use any tobacco products, including cigarettes, chewing tobacco, and electronic cigarettes. If you need help quitting, ask your doctor. You may get counseling or other support to help you quit.  Only take medicine as told by your doctor. Some medicines are safe and some are not during pregnancy.  Exercise only as told by your doctor. Stop exercising if you start having cramps.  Eat regular, healthy meals.  Wear a good support bra if your breasts are tender.  Do not use hot tubs, steam rooms, or saunas.  Wear your seat belt when driving.  Avoid raw meat, uncooked cheese, and liter boxes and soil used by cats.  Take your prenatal vitamins.  Take 1500-2000 milligrams of calcium daily starting at the 20th week of pregnancy until you deliver your baby.  Try taking medicine that helps you poop (stool softener) as needed, and if your doctor approves. Eat more fiber by eating fresh fruit, vegetables, and whole grains. Drink enough fluids to keep your pee (urine) clear or pale yellow.  Take warm water baths (sitz baths) to soothe pain or discomfort caused by hemorrhoids. Use hemorrhoid cream if your doctor approves.  If you have puffy, bulging veins (varicose veins), wear support hose. Raise (elevate) your feet for 15 minutes, 3-4 times a day. Limit salt in your diet.  Avoid heavy  lifting, wear low heals, and sit up straight.  Rest with your legs raised if you have leg cramps or low back pain.  Visit your dentist if you have not gone during your pregnancy. Use a soft toothbrush to brush your teeth. Be gentle when you floss.  You can have sex (intercourse) unless your doctor tells you not to.  Go to your doctor visits. Get help if:  You feel dizzy.  You have mild cramps or pressure in your lower belly (abdomen).  You have a nagging pain in your belly area.  You continue to feel sick to your stomach (nauseous), throw up (vomit), or have watery poop (diarrhea).  You have bad smelling fluid coming from your vagina.  You have pain with peeing (urination). Get help right away if:  You have a fever.  You are leaking fluid from your vagina.  You have spotting or bleeding from your vagina.  You have severe belly cramping or pain.  You lose or gain weight rapidly.  You have trouble catching your breath and have chest pain.  You notice sudden or extreme puffiness (swelling) of your face, hands, ankles, feet, or legs.  You have not felt the baby move in over an hour.  You have severe headaches that do not go away with medicine.  You have vision changes. This information is not intended to replace advice given to you by your health care provider. Make sure you discuss any questions you have with your health care   provider. Document Released: 02/02/2010 Document Revised: 04/15/2016 Document Reviewed: 01/09/2013 Elsevier Interactive Patient Education  2017 [employer] Inc.  

## 2018-08-25 LAB — COMPREHENSIVE METABOLIC PANEL
ALBUMIN: 3.5 g/dL (ref 3.5–5.5)
ALK PHOS: 41 IU/L (ref 39–117)
ALT: 8 IU/L (ref 0–32)
AST: 15 IU/L (ref 0–40)
Albumin/Globulin Ratio: 1.3 (ref 1.2–2.2)
BUN / CREAT RATIO: 12 (ref 9–23)
BUN: 7 mg/dL (ref 6–20)
Bilirubin Total: <0.2 mg/dL (ref 0.0–1.2)
CALCIUM: 9 mg/dL (ref 8.7–10.2)
CO2: 21 mmol/L (ref 20–29)
CREATININE: 0.57 mg/dL (ref 0.57–1.00)
Chloride: 103 mmol/L (ref 96–106)
GFR calc Af Amer: 150 mL/min/{1.73_m2} (ref >59)
GFR, EST NON AFRICAN AMERICAN: 130 mL/min/{1.73_m2} (ref >59)
GLOBULIN, TOTAL: 2.8 g/dL (ref 1.5–4.5)
Glucose: 57 mg/dL — ABNORMAL LOW (ref 65–99)
Potassium: 4.5 mmol/L (ref 3.5–5.2)
SODIUM: 139 mmol/L (ref 134–144)
TOTAL PROTEIN: 6.3 g/dL (ref 6.0–8.5)

## 2018-08-25 LAB — PROTEIN / CREATININE RATIO, URINE
CREATININE, UR: 139.5 mg/dL
Protein, Ur: 15.2 mg/dL
Protein/Creat Ratio: 109 mg/g creat (ref 0–200)

## 2018-08-26 LAB — AFP TETRA
DIA VALUE (EIA): 129.84 pg/mL
DSR (By Age)    1 IN: 1056
GESTATIONAL AGE AFP: 22 wk
MSAFP MOM: 0.56
MSAFP: 42.8 ng/mL
MSHCG: 14462 m[IU]/mL
Maternal Age At EDD: 24.4 yr
Osb Risk: 10000
Weight: 159 [lb_av]
uE3 Value: 2.78 ng/mL

## 2018-08-26 LAB — HEMOGLOBIN A1C
Est. average glucose Bld gHb Est-mCnc: 103 mg/dL
Hgb A1c MFr Bld: 5.2 % (ref 4.8–5.6)

## 2018-08-27 LAB — CULTURE, OB URINE

## 2018-08-27 LAB — URINE CULTURE, OB REFLEX

## 2018-08-28 ENCOUNTER — Telehealth: Payer: Self-pay | Admitting: *Deleted

## 2018-08-28 DIAGNOSIS — B9689 Other specified bacterial agents as the cause of diseases classified elsewhere: Secondary | ICD-10-CM

## 2018-08-28 DIAGNOSIS — N760 Acute vaginitis: Principal | ICD-10-CM

## 2018-08-28 LAB — CERVICOVAGINAL ANCILLARY ONLY
BACTERIAL VAGINITIS: POSITIVE — AB
CHLAMYDIA, DNA PROBE: NEGATIVE
Candida vaginitis: NEGATIVE
NEISSERIA GONORRHEA: NEGATIVE
Trichomonas: NEGATIVE

## 2018-08-28 MED ORDER — METRONIDAZOLE 500 MG PO TABS: 500.0000 mg | ORAL_TABLET | Freq: Two times a day (BID) | ORAL | 0 refills | Status: DC

## 2018-08-28 NOTE — Telephone Encounter (Signed)
-----   Message from Laury Deep, [place] sent at 08/28/2018  4:03 PM EDT ----- Please send Rx for BV

## 2018-08-28 NOTE — Telephone Encounter (Signed)
Unable to leave voice message regarding lab result. Metronidazole 500 mg PO BID x 7 days with food sent to patient's pharmacy.  Derl Barrow, RN

## 2018-09-21 ENCOUNTER — Encounter: Payer: Medicaid Other | Admitting: Obstetrics and Gynecology

## 2018-10-12 ENCOUNTER — Telehealth: Payer: Self-pay | Admitting: General Practice

## 2018-10-12 ENCOUNTER — Encounter: Payer: Medicaid Other | Admitting: Obstetrics and Gynecology

## 2018-10-12 NOTE — Telephone Encounter (Signed)
Patient called stating that she was running late for appt.  Her appt was scheduled for 4:10pm and received called after 4:30pm.  Explained to patient that we closed at 5pm and that we will have to get her rescheduled.  Patient said that she has moved to [facility] and that she need her records sent to another office. I explained to patient that she will need to sign a ROI and verbalized understanding.  Offered to schedule first available, but declined.

## 2019-03-19 ENCOUNTER — Encounter (HOSPITAL_COMMUNITY): Payer: Self-pay

## 2020-11-28 ENCOUNTER — Emergency Department (HOSPITAL_COMMUNITY)
Admission: EM | Admit: 2020-11-28 | Discharge: 2020-11-28 | Disposition: A | Discharge status: Home / Self Care | Payer: Medicaid Other | Attending: Emergency Medicine | Admitting: Emergency Medicine

## 2020-11-28 ENCOUNTER — Emergency Department (HOSPITAL_COMMUNITY): Payer: Medicaid Other

## 2020-11-28 DIAGNOSIS — Z7982 Long term (current) use of aspirin: Secondary | ICD-10-CM | POA: Diagnosis not present

## 2020-11-28 DIAGNOSIS — Z87891 Personal history of nicotine dependence: Secondary | ICD-10-CM | POA: Insufficient documentation

## 2020-11-28 DIAGNOSIS — M25571 Pain in right ankle and joints of right foot: Secondary | ICD-10-CM | POA: Diagnosis not present

## 2020-11-28 MED ORDER — IBUPROFEN 400 MG PO TABS
600.0000 mg | ORAL_TABLET | Freq: Once | ORAL | Status: AC
  Administered 2020-11-28: 600 mg via ORAL
  Filled 2020-11-28: qty 1

## 2020-11-28 NOTE — ED Triage Notes (Signed)
Pt here with swelling and pain to R ankle after tripping on the steps yesterday.

## 2020-11-28 NOTE — Discharge Instructions (Addendum)
Please read and follow all provided instructions.  You have been seen today for ankle sprain.   Tests performed today include: An x-ray of the affected area - does NOT show any broken bones or dislocations.  Vital signs. See below for your results today.   Home care instructions: -- *PRICE in the first 24-48 hours after injury: Protect (with brace, splint, sling), if given by your provider Rest Ice- Do not apply ice pack directly to your skin, place towel or similar between your skin and ice/ice pack. Apply ice for 20 min, then remove for 40 min while awake Compression- Wear brace, elastic bandage, splint as directed by your provider Elevate affected extremity above the level of your heart when not walking around for the first 24-48 hours   Use Ibuprofen (Motrin/Advil) 600mg  every 6 hours as needed for pain (do not exceed max dose in 24 hours, 2400mg )  Follow-up instructions: Please follow-up with your primary care provider or the provided orthopedic physician (bone specialist) if you continue to have significant pain in 1 week. In this case you may have a more severe injury that requires further care.   Return instructions:  Please return if your toes or feet are numb or tingling, appear gray or blue, or you have severe pain (also elevate the leg and loosen splint or wrap if you were given one) Please return to the Emergency Department if you experience worsening symptoms.  Please return if you have any other emergent concerns. Additional Information:  Your vital signs today were: BP 120/75 (BP Location: Left Arm)   Pulse (!) 112   Temp 98.5 F (36.9 C) (Oral)   Resp 16   Ht 5\' 5"  (1.651 m)   SpO2 100%   BMI 26.46 kg/m  If your blood pressure (BP) was elevated above 135/85 this visit, please have this repeated by your doctor within one month. ---------------

## 2020-11-28 NOTE — ED Provider Notes (Signed)
[facility] EMERGENCY DEPARTMENT Provider Note   CSN: [id number] Arrival date & time: 11/28/20  1328     History Chief Complaint  Patient presents with  . Ankle Pain    Carlin Mamone is a 25 y.o. female with pertinent past medical history of anemia, depression presents to the emergency room today for right ankle injury yesterday.  Patient states that she was walking down the stairs with a box in her hands, looked down to see where she was going and twisted her ankle.  States that it was inverted.  Denies any previous injury to this area.  Denies any blood thinners.  States that she was in her normal health before this.  Denies any numbness or tingling.  States that pain is mainly on the lateral side of her ankle.  Denies any fevers or chills.  Did not hit herself anywhere else.  Not her head.  States that she is able to bear weight, however it does hurt.  Has not taken anything for this.  States that this occurred yesterday, has been swelling today.  No other complaints at this time.  HPI     Past Medical History:  Diagnosis Date  . Anemia   . Bronchitis   . Bronchitis, acute   . Depression   . Irregular menstrual bleeding   . Trichomonosis 2016    Patient Active Problem List   Diagnosis Date Noted  . Supervision of other normal pregnancy, antepartum 06/22/2018  . History of postpartum hypertension 05/24/2018    Past Surgical History:  Procedure Laterality Date  . WISDOM TOOTH EXTRACTION       OB History    Gravida  3   Para  1   Term  1   Preterm  0   AB  1   Living  1     SAB  1   IAB  0   Ectopic  0   Multiple  0   Live Births  1           Family History  Problem Relation Age of Onset  . Asthma Mother     Social History   Tobacco Use  . Smoking status: Former Research scientist (life sciences)  . Smokeless tobacco: Never Used  Substance Use Topics  . Alcohol use: No  . Drug use: No    Home Medications Prior to Admission medications    Medication Sig Start Date End Date Taking? Authorizing Provider  aspirin (ASPIRIN CHILDRENS) 81 MG chewable tablet Chew 1 tablet (81 mg total) by mouth daily. 08/24/18   Laury Deep, CNM  aspirin EC 81 MG tablet Take 1 tablet (81 mg total) by mouth daily. 06/22/18   Tresea Mall, CNM  metroNIDAZOLE (FLAGYL) 500 MG tablet Take 1 tablet (500 mg total) by mouth 2 (two) times daily. 08/28/18   Laury Deep, CNM  Prenatal Vit-Fe Fumarate-FA (PRENATAL MULTIVITAMIN) TABS tablet Take 1 tablet by mouth daily at 12 noon. 05/19/18   Chancy Milroy, MD  Prenatal Vit-Fe Fumarate-FA (PRENATAL ONE DAILY) 27-0.8 MG TABS Take 1 tablet by mouth daily. 08/24/18   Laury Deep, CNM    Allergies    Tomato  Review of Systems   Review of Systems  Constitutional: Negative for diaphoresis, fatigue and fever.  Eyes: Negative for visual disturbance.  Respiratory: Negative for shortness of breath.   Cardiovascular: Negative for chest pain.  Gastrointestinal: Negative for nausea and vomiting.  Musculoskeletal: Positive for arthralgias and joint swelling. Negative for back  pain and myalgias.  Skin: Negative for color change, pallor, rash and wound.  Neurological: Negative for syncope, weakness, light-headedness, numbness and headaches.  Psychiatric/Behavioral: Negative for behavioral problems and confusion.    Physical Exam Updated Vital Signs BP 120/75 (BP Location: Left Arm)   Pulse (!) 112   Temp 98.5 F (36.9 C) (Oral)   Resp 16   Ht 5\' 5"  (1.651 m)   SpO2 100%   BMI 26.46 kg/m   Physical Exam Constitutional:      General: She is not in acute distress.    Appearance: Normal appearance. She is not ill-appearing, toxic-appearing or diaphoretic.  HENT:     Head: Normocephalic and atraumatic.  Eyes:     Extraocular Movements: Extraocular movements intact.     Pupils: Pupils are equal, round, and reactive to light.  Cardiovascular:     Rate and Rhythm: Normal rate and regular rhythm.      Pulses: Normal pulses.  Pulmonary:     Effort: Pulmonary effort is normal.     Breath sounds: Normal breath sounds.  Musculoskeletal:        General: Normal range of motion.       Feet:  Feet:     Comments: Patient with tenderness to right ankle, on lateral malleolus.  Mild edema present, no ecchymosis.  No erythema or warmth to ankle.  Patient with limited range of motion due to injury, however patient can extend and flex ankle with normal sensation throughout.  Can also adduct and abduct ankle.  No tenderness to midfoot.  Compartments are soft.  Normal leg raise.  Normal strength and sensation to toes.  PT pulses 2+ and equal.  Normal cap refill. Skin:    General: Skin is warm and dry.     Capillary Refill: Capillary refill takes less than 2 seconds.  Neurological:     General: No focal deficit present.     Mental Status: She is alert and oriented to person, place, and time.  Psychiatric:        Mood and Affect: Mood normal.        Behavior: Behavior normal.        Thought Content: Thought content normal.     ED Results / Procedures / Treatments   Labs (all labs ordered are listed, but only abnormal results are displayed) Labs Reviewed - No data to display  EKG None  Radiology DG Ankle Complete Right  Result Date: 11/28/2020 CLINICAL DATA:  Pain and swelling EXAM: RIGHT ANKLE - COMPLETE 3+ VIEW COMPARISON:  None. FINDINGS: There is soft tissue swelling primarily about the lateral malleolus. There is no acute displaced fracture or dislocation. There are no significant degenerative changes. IMPRESSION: Soft tissue swelling without acute displaced fracture or dislocation. Electronically Signed   By: Constance Holster M.D.   On: 11/28/2020 14:32    Procedures Procedures (including critical care time)  Medications Ordered in ED Medications  ibuprofen (ADVIL) tablet 600 mg (has no administration in time range)    ED Course  I have reviewed the triage vital signs and the  nursing notes.  Pertinent labs & imaging results that were available during my care of the patient were reviewed by me and considered in my medical decision making (see chart for details).    MDM Rules/Calculators/A&P                         Calena Salem is a 25 y.o. female with pertinent  past medical history of anemia, depression presents to the emergency room today for right ankle injury yesterday.  Patient with tenderness to right ankle, lateral malleolus.  Plain films with soft tissue swelling without acute fracture or dislocation.  Patient with ankle sprain.  Patient is distally neurovascularly intact.  Pain controlled today here in the ER, will apply ankle brace and crutches and have patient follow-up with sports medicine.  RICE therapy discussed, patient to be discharged.  Doubt need for further emergent work up at this time. I explained the diagnosis and have given explicit precautions to return to the ER including for any other new or worsening symptoms. The patient understands and accepts the medical plan as it's been dictated and I have answered their questions. Discharge instructions concerning home care and prescriptions have been given. The patient is STABLE and is discharged to home in good condition.   Final Clinical Impression(s) / ED Diagnoses Final diagnoses:  Acute right ankle pain    Rx / DC Orders ED Discharge Orders    None       Alfredia Client, PA-C 11/28/20 1551    Luna Fuse, MD 11/28/20 1635

## 2021-10-29 ENCOUNTER — Emergency Department (HOSPITAL_COMMUNITY): Payer: Medicaid Other

## 2021-10-29 ENCOUNTER — Emergency Department (HOSPITAL_COMMUNITY)
Admission: EM | Admit: 2021-10-29 | Discharge: 2021-10-29 | Disposition: A | Discharge status: Home / Self Care | Payer: Medicaid Other | Attending: Emergency Medicine | Admitting: Emergency Medicine

## 2021-10-29 ENCOUNTER — Encounter (HOSPITAL_COMMUNITY): Payer: Self-pay

## 2021-10-29 ENCOUNTER — Other Ambulatory Visit: Payer: Self-pay

## 2021-10-29 DIAGNOSIS — A599 Trichomoniasis, unspecified: Secondary | ICD-10-CM | POA: Diagnosis not present

## 2021-10-29 DIAGNOSIS — O98311 Other infections with a predominantly sexual mode of transmission complicating pregnancy, first trimester: Secondary | ICD-10-CM

## 2021-10-29 DIAGNOSIS — B9689 Other specified bacterial agents as the cause of diseases classified elsewhere: Secondary | ICD-10-CM

## 2021-10-29 DIAGNOSIS — Z3A09 9 weeks gestation of pregnancy: Secondary | ICD-10-CM

## 2021-10-29 DIAGNOSIS — Z87891 Personal history of nicotine dependence: Secondary | ICD-10-CM | POA: Insufficient documentation

## 2021-10-29 DIAGNOSIS — A5901 Trichomonal vulvovaginitis: Secondary | ICD-10-CM | POA: Diagnosis present

## 2021-10-29 DIAGNOSIS — R110 Nausea: Secondary | ICD-10-CM | POA: Insufficient documentation

## 2021-10-29 DIAGNOSIS — O021 Missed abortion: Secondary | ICD-10-CM | POA: Diagnosis not present

## 2021-10-29 DIAGNOSIS — N939 Abnormal uterine and vaginal bleeding, unspecified: Secondary | ICD-10-CM | POA: Diagnosis present

## 2021-10-29 DIAGNOSIS — O039 Complete or unspecified spontaneous abortion without complication: Secondary | ICD-10-CM | POA: Diagnosis present

## 2021-10-29 DIAGNOSIS — A64 Unspecified sexually transmitted disease: Secondary | ICD-10-CM

## 2021-10-29 DIAGNOSIS — N898 Other specified noninflammatory disorders of vagina: Secondary | ICD-10-CM | POA: Diagnosis not present

## 2021-10-29 HISTORY — DX: Trichomonal vulvovaginitis: A59.01

## 2021-10-29 LAB — WET PREP, GENITAL
Sperm: NONE SEEN
WBC, Wet Prep HPF POC: >=10 — AB (ref <10)
Yeast Wet Prep HPF POC: NONE SEEN

## 2021-10-29 LAB — URINALYSIS, ROUTINE W REFLEX MICROSCOPIC
Bilirubin Urine: NEGATIVE
Glucose, UA: NEGATIVE mg/dL
Ketones, ur: NEGATIVE mg/dL
Nitrite: NEGATIVE
Protein, ur: NEGATIVE mg/dL
Specific Gravity, Urine: 1.02 (ref 1.005–1.030)
pH: 7 (ref 5.0–8.0)

## 2021-10-29 LAB — HCG, QUANTITATIVE, PREGNANCY: hCG, Beta Chain, Quant, S: 11318 m[IU]/mL — ABNORMAL HIGH (ref <5)

## 2021-10-29 LAB — CBC
HCT: 37.9 % (ref 36.0–46.0)
Hemoglobin: 12.5 g/dL (ref 12.0–15.0)
MCH: 28.9 pg (ref 26.0–34.0)
MCHC: 33 g/dL (ref 30.0–36.0)
MCV: 87.5 fL (ref 80.0–100.0)
Platelets: 401 10*3/uL — ABNORMAL HIGH (ref 150–400)
RBC: 4.33 MIL/uL (ref 3.87–5.11)
RDW: 17.4 % — ABNORMAL HIGH (ref 11.5–15.5)
WBC: 6.8 10*3/uL (ref 4.0–10.5)
nRBC: 0 % (ref 0.0–0.2)

## 2021-10-29 LAB — I-STAT BETA HCG BLOOD, ED (MC, WL, AP ONLY): I-stat hCG, quantitative: >2000 m[IU]/mL — ABNORMAL HIGH (ref <5)

## 2021-10-29 LAB — HIV ANTIBODY (ROUTINE TESTING W REFLEX): HIV Screen 4th Generation wRfx: NONREACTIVE

## 2021-10-29 MED ORDER — DOXYCYCLINE HYCLATE 100 MG PO TABS
100.0000 mg | ORAL_TABLET | Freq: Once | ORAL | Status: AC
  Administered 2021-10-29: 100 mg via ORAL
  Filled 2021-10-29: qty 1

## 2021-10-29 MED ORDER — METRONIDAZOLE 500 MG PO TABS: 500.0000 mg | ORAL_TABLET | Freq: Two times a day (BID) | ORAL | 0 refills | Status: DC

## 2021-10-29 MED ORDER — DOXYCYCLINE HYCLATE 100 MG PO CAPS: 100.0000 mg | ORAL_CAPSULE | Freq: Two times a day (BID) | ORAL | 0 refills | Status: DC

## 2021-10-29 MED ORDER — CEFTRIAXONE SODIUM 1 G IJ SOLR
500.0000 mg | Freq: Once | INTRAMUSCULAR | Status: AC
  Administered 2021-10-29: 500 mg via INTRAMUSCULAR
  Filled 2021-10-29: qty 10

## 2021-10-29 MED ORDER — METRONIDAZOLE 500 MG PO TABS
500.0000 mg | ORAL_TABLET | Freq: Once | ORAL | Status: AC
  Administered 2021-10-29: 500 mg via ORAL
  Filled 2021-10-29: qty 1

## 2021-10-29 NOTE — ED Provider Notes (Signed)
Emergency Medicine Provider Triage Evaluation Note  Michelle Greer , a 25 y.o. female  was evaluated in triage.  Pt complains of missed period, lower abd pain, nausea.  Review of Systems  Positive: Missed period, lower abd pain, nausea Negative: fever  Physical Exam  BP 127/72 (BP Location: Left Arm)   Pulse 96   Temp 97.7 F (36.5 C) (Oral)   Resp 18   SpO2 100%  Gen:   Awake, no distress   Resp:  Normal effort  MSK:   Moves extremities without difficulty   Medical Decision Making  Medically screening exam initiated at 3:40 PM.  Appropriate orders placed.  Michelle Greer was informed that the remainder of the evaluation will be completed by another provider, this initial triage assessment does not replace that evaluation, and the importance of remaining in the ED until their evaluation is complete.     Bishop Dublin 10/29/21 1542    Fredia Sorrow, MD 11/05/21 [phone]

## 2021-10-29 NOTE — Consult Note (Signed)
      OBSTETRICS AND GYNECOLOGY ATTENDING TELEPHONE CONSULT NOTE   Consult Date:10/29/2021   Reason for Consult: Intrauterine fetal demise at [redacted] weeks gestation Consulting Provider: Evlyn Courier, PA-C   Consultation Details (from provider and chart review):  Michelle Greer is a 25 y.o. G3P1011 at [facility] Emergency [facility].  Presented with vaginal spotting, missed menstrual period, abdominal cramping, nausea, foul-smelling vaginal discharge. She had a positive I-Stat beta HCG and ultrasound evaluation revealed 9 week intrauterine fetal demise.  BP 127/72 (BP Location: Left Arm)   Pulse 96   Temp 97.7 F (36.5 C) (Oral)   Resp 18   SpO2 100%   Exam performed by consulting provider and was unremarkable.   Pertinent labs and imaging:  CBC pending Wet prep with + trichomonal vaginitis and bacterial vaginitis US OB Comp < 14 Wks  Result Date: 10/29/2021 CLINICAL DATA:  Bleeding during pregnancy. EXAM: OBSTETRIC <14 WK ULTRASOUND TECHNIQUE: Transabdominal ultrasound was performed for evaluation of the gestation as well as the maternal uterus and adnexal regions. COMPARISON:  None. FINDINGS: Intrauterine gestational sac: Single Yolk sac:  Not Visualized. Embryo:  Visualized. Cardiac Activity: Not Visualized. CRL:   24.1 mm   9 w 1 d                  [place] EDC: 06/02/2022 Subchorionic hemorrhage: Small left subchorionic hemorrhage present. Maternal uterus/adnexae: Bilateral ovaries within normal limits IMPRESSION: 1. Single failed intrauterine gestation measuring 9 weeks 1 day by crown-rump length. 2. Small subchorionic hemorrhage. Electronically Signed   By: Ronney Asters M.D.   On: 10/29/2021 19:14    The provider had a clinical question about management  Recommendations: Discussed management of early intrauterine demise briefly: expectant management vs misoprostol vs Dilation and Evacuation . Patient is hemodynamically stable, no need for emergent management.  Message sent to the office [facility]  at [facility]) for her to be added on to schedule tomorrow 10/30/21 or Monday 11/02/21. In the meantime, if she has any fevers, bleeding more than one pad a hour, severe pain or any concerning symptoms, she can come to [facility] MAU for further management.  I also advised provider to treat for her trichomonal vaginitis and bacterial vaginitis using Metronidazole 500 mg po bid x 7 days.   Thank you for this telephone consult.  If additional recommendations are needed, please call [phone] [facility] OB/GYN Consult Attending Monday-Friday 8am - 5pm) or [phone] [facility] OB/GYN Attending On Call all day, every day).    I spent approximately 10 minutes directly consulting with the provider and verbally discussing this case. Additional 10 minutes was spent performing chart review and documentation.    Verita Schneiders, MD [place], [facility] for [employer], [place]

## 2021-10-29 NOTE — ED Provider Notes (Signed)
[place] DEPT Provider Note   CSN: [id number] Arrival date & time: 10/29/21  1449     History Chief Complaint  Patient presents with   Exposure to STD   Possible Pregnancy    Kirsty Monjaraz is a 25 y.o. female.  25 year old female presents today for evaluation of vaginal spotting of couple day duration, abdominal cramping, nausea of a couple week duration.  She reports she typically has couple her typical menstrual cycle includes 2 episodes of bleeding within the month 1 at the beginning and 1 towards the end of the month.  She reports her last menstrual cycle was at the end of October and ended just prior to [place].  She reports she has not had another episode since which is unusual for her.  She reports she was last sexually active without protection around hole of bleeding as well.  She reports she only been active with 1 female partner, but she has not been in contact with him since to know if he has any STI symptoms.  She reports she has noticed yellowish vaginal discharge with foul odor.  She denies fever, chills, vomiting.  She has not taken home pregnancy test.  The history is provided by the patient. No language interpreter was used.      Past Medical History:  Diagnosis Date   Anemia    Bronchitis    Bronchitis, acute    Depression    Irregular menstrual bleeding    Trichomonosis 2016    Patient Active Problem List   Diagnosis Date Noted   Supervision of other normal pregnancy, antepartum 06/22/2018   History of postpartum hypertension 05/24/2018    Past Surgical History:  Procedure Laterality Date   WISDOM TOOTH EXTRACTION       OB History     Gravida  3   Para  1   Term  1   Preterm  0   AB  1   Living  1      SAB  1   IAB  0   Ectopic  0   Multiple  0   Live Births  1           Family History  Problem Relation Age of Onset   Asthma Mother     Social History   Tobacco Use   Smoking status:  Former   Smokeless tobacco: Never  Substance Use Topics   Alcohol use: No   Drug use: No    Home Medications Prior to Admission medications   Medication Sig Start Date End Date Taking? Authorizing Provider  aspirin (ASPIRIN CHILDRENS) 81 MG chewable tablet Chew 1 tablet (81 mg total) by mouth daily. 08/24/18   Laury Deep, CNM  aspirin EC 81 MG tablet Take 1 tablet (81 mg total) by mouth daily. 06/22/18   Tresea Mall, CNM  metroNIDAZOLE (FLAGYL) 500 MG tablet Take 1 tablet (500 mg total) by mouth 2 (two) times daily. 08/28/18   Laury Deep, CNM  Prenatal Vit-Fe Fumarate-FA (PRENATAL MULTIVITAMIN) TABS tablet Take 1 tablet by mouth daily at 12 noon. 05/19/18   Chancy Milroy, MD  Prenatal Vit-Fe Fumarate-FA (PRENATAL ONE DAILY) 27-0.8 MG TABS Take 1 tablet by mouth daily. 08/24/18   Laury Deep, CNM    Allergies    Tomato  Review of Systems   Review of Systems  Constitutional:  Negative for activity change, chills and fever.  Gastrointestinal:  Positive for abdominal pain and nausea. Negative for diarrhea  and vomiting.  Genitourinary:  Positive for vaginal bleeding and vaginal discharge.  Neurological:  Negative for light-headedness.  All other systems reviewed and are negative.  Physical Exam Updated Vital Signs BP 127/72 (BP Location: Left Arm)   Pulse 96   Temp 97.7 F (36.5 C) (Oral)   Resp 18   SpO2 100%   Physical Exam Vitals and nursing note reviewed. Exam conducted with a chaperone present.  Constitutional:      General: She is not in acute distress.    Appearance: Normal appearance. She is not ill-appearing.  HENT:     Head: Normocephalic and atraumatic.     Nose: Nose normal.  Eyes:     General: No scleral icterus.    Extraocular Movements: Extraocular movements intact.     Conjunctiva/sclera: Conjunctivae normal.  Cardiovascular:     Rate and Rhythm: Normal rate and regular rhythm.     Pulses: Normal pulses.     Heart sounds: Normal heart  sounds.  Pulmonary:     Effort: Pulmonary effort is normal. No respiratory distress.     Breath sounds: Normal breath sounds. No wheezing or rales.  Abdominal:     General: There is no distension.     Palpations: Abdomen is soft.     Tenderness: There is no abdominal tenderness. There is no guarding.  Genitourinary:    Exam position: Lithotomy position.     Vagina: No tenderness or bleeding.     Cervix: No cervical motion tenderness or discharge.     Adnexa: Right adnexa normal and left adnexa normal.       Right: No tenderness or fullness.         Left: No tenderness or fullness.    Musculoskeletal:        General: Normal range of motion.     Cervical back: Normal range of motion.  Skin:    General: Skin is warm and dry.  Neurological:     General: No focal deficit present.     Mental Status: She is alert. Mental status is at baseline.    ED Results / Procedures / Treatments   Labs (all labs ordered are listed, but only abnormal results are displayed) Labs Reviewed  WET PREP, GENITAL  URINALYSIS, ROUTINE W REFLEX MICROSCOPIC  HIV ANTIBODY (ROUTINE TESTING W REFLEX)  RPR  I-STAT BETA HCG BLOOD, ED ([facility], [facility], [facility] ONLY)  GC/CHLAMYDIA PROBE AMP () NOT AT [facility]    EKG None  Radiology No results found.  Procedures Procedures   Medications Ordered in ED Medications - No data to display  ED Course  I have reviewed the triage vital signs and the nursing notes.  Pertinent labs & imaging results that were available during my care of the patient were reviewed by me and considered in my medical decision making (see chart for details).    MDM Rules/Calculators/A&P                           25 year old female presents today for evaluation of vaginal spotting, missed menstrual period, abdominal cramping, nausea, foul-smelling vaginal discharge.  hCG count greater than 2000.  Pelvic exam without signs of bleeding.  STI panel collected.  Will await ultrasound to rule  out ectopic pregnancy or other abnormalities. Discussed with OB who will reach out to the clinic and schedule an appointment for either tomorrow or Monday at the latest to discuss further options.  CBC in the emergency  room is stable.  We will treat patient for BV, trichomoniasis.  Patient opted to get treated for chlamydia and gonorrhea tonight.  Patient is appropriate for discharge.  Patient will follow-up with OB in clinic.  Patient voices understanding and is in agreement with plan.  Final Clinical Impression(s) / ED Diagnoses Final diagnoses:  None    Rx / DC Orders ED Discharge Orders     None        Evlyn Courier, PA-C 10/29/21 2210    Davonna Belling, MD 10/29/21 2252

## 2021-10-29 NOTE — ED Triage Notes (Signed)
Pt arrived via POV, states she missed menstrual cycle, concern for pregnancy and STD exposure. Endorses breast tenderness and vaginal discharge with odor.

## 2021-10-29 NOTE — Discharge Instructions (Addendum)
Your ultrasound today showed that your pregnancy is not viable.  I have discussed your case with OB and they will call to get your appointment scheduled for either tomorrow or Monday.  They do not recommend starting any treatment tonight.  He also did test positive for trichomonas for which you received your first dose antibiotic in the emergency room and additional supply sent to your pharmacy.  You also decided you wanted to be treated for gonorrhea and chlamydia before receiving the results.  You received a shot of antibiotic as well as a first dose of doxycycline in the emergency room.  Additional dose of doxycycline has been sent to your pharmacy.  Please avoid sexual intercourse until 7 days after you complete the course of doxycycline.  If you have any fever, bleeding more than 1 pad a hour, develop severe pain or any concerning symptoms you can go to [facility] at [facility].

## 2021-10-30 ENCOUNTER — Telehealth: Payer: Self-pay | Admitting: Radiology

## 2021-10-30 LAB — GC/CHLAMYDIA PROBE AMP (~~LOC~~) NOT AT ARMC
Chlamydia: NEGATIVE
Comment: NEGATIVE
Comment: NORMAL
Neisseria Gonorrhea: NEGATIVE

## 2021-10-30 LAB — RPR: RPR Ser Ql: NONREACTIVE

## 2021-10-30 NOTE — Telephone Encounter (Signed)
Left voicemail for patient regarding the Rn appt on 11/12/21 @ 1:30 at [facility]. Address and phone number provided.

## 2021-11-01 ENCOUNTER — Encounter (HOSPITAL_COMMUNITY): Payer: Self-pay | Admitting: Obstetrics and Gynecology

## 2021-11-01 ENCOUNTER — Other Ambulatory Visit: Payer: Self-pay

## 2021-11-01 ENCOUNTER — Inpatient Hospital Stay (HOSPITAL_COMMUNITY)
Admission: AD | Admit: 2021-11-01 | Discharge: 2021-11-02 | Disposition: A | Discharge status: Home / Self Care | Payer: Medicaid Other | Attending: Obstetrics and Gynecology | Admitting: Obstetrics and Gynecology

## 2021-11-01 ENCOUNTER — Inpatient Hospital Stay (HOSPITAL_COMMUNITY): Payer: Medicaid Other

## 2021-11-01 DIAGNOSIS — O0289 Other abnormal products of conception: Secondary | ICD-10-CM

## 2021-11-01 DIAGNOSIS — Z3A09 9 weeks gestation of pregnancy: Secondary | ICD-10-CM | POA: Insufficient documentation

## 2021-11-01 DIAGNOSIS — O021 Missed abortion: Secondary | ICD-10-CM | POA: Insufficient documentation

## 2021-11-01 DIAGNOSIS — N939 Abnormal uterine and vaginal bleeding, unspecified: Secondary | ICD-10-CM

## 2021-11-01 DIAGNOSIS — O039 Complete or unspecified spontaneous abortion without complication: Secondary | ICD-10-CM

## 2021-11-01 LAB — URINALYSIS, ROUTINE W REFLEX MICROSCOPIC
Bilirubin Urine: NEGATIVE
Glucose, UA: NEGATIVE mg/dL
Ketones, ur: NEGATIVE mg/dL
Leukocytes,Ua: NEGATIVE
Nitrite: NEGATIVE
Protein, ur: NEGATIVE mg/dL
RBC / HPF: >50 RBC/hpf — ABNORMAL HIGH (ref 0–5)
Specific Gravity, Urine: 1.01 (ref 1.005–1.030)
pH: 8 (ref 5.0–8.0)

## 2021-11-01 LAB — CBC
HCT: 36.2 % (ref 36.0–46.0)
Hemoglobin: 12 g/dL (ref 12.0–15.0)
MCH: 29.3 pg (ref 26.0–34.0)
MCHC: 33.1 g/dL (ref 30.0–36.0)
MCV: 88.3 fL (ref 80.0–100.0)
Platelets: 365 10*3/uL (ref 150–400)
RBC: 4.1 MIL/uL (ref 3.87–5.11)
RDW: 17.8 % — ABNORMAL HIGH (ref 11.5–15.5)
WBC: 4.2 10*3/uL (ref 4.0–10.5)
nRBC: 0 % (ref 0.0–0.2)

## 2021-11-01 LAB — TYPE AND SCREEN
ABO/RH(D): O POS
Antibody Screen: NEGATIVE

## 2021-11-01 LAB — HCG, QUANTITATIVE, PREGNANCY: hCG, Beta Chain, Quant, S: 986 m[IU]/mL — ABNORMAL HIGH (ref <5)

## 2021-11-01 MED ORDER — KETOROLAC TROMETHAMINE 60 MG/2ML IM SOLN
60.0000 mg | Freq: Once | INTRAMUSCULAR | Status: AC
  Administered 2021-11-01: 60 mg via INTRAMUSCULAR
  Filled 2021-11-01: qty 2

## 2021-11-01 MED ORDER — MISOPROSTOL 200 MCG PO TABS: 800.0000 ug | ORAL_TABLET | Freq: Once | ORAL | Status: DC

## 2021-11-01 MED ORDER — HYDROMORPHONE HCL 1 MG/ML IJ SOLN
1.0000 mg | Freq: Once | INTRAMUSCULAR | Status: AC
  Administered 2021-11-01: 1 mg via INTRAVENOUS
  Filled 2021-11-01: qty 1

## 2021-11-01 MED ORDER — LACTATED RINGERS IV BOLUS
1000.0000 mL | Freq: Once | INTRAVENOUS | Status: AC
  Administered 2021-11-01: 1000 mL via INTRAVENOUS

## 2021-11-01 NOTE — MAU Provider Note (Incomplete Revision)
History     CSN: [id number]  Arrival date and time: 11/01/21 1845   Event Date/Time   First Provider Initiated Contact with Patient 11/01/21 1910      Chief Complaint  Patient presents with   Abdominal Pain   Vaginal Bleeding   HPI Michelle Greer is a 25 y.o. G3P2011 at Unknown who presents to MAU via EMS with heavy vaginal bleeding. Patient is s/p diagnosis of 9 week loss on 10/29/2021.  She reports heavy vaginal bleeding and abdominal pain throughout the day. She reports saturating a pad about every 3 hours. Her abdominal pain is suprapubic, pain score is 10/10. She has not taken medication or tried other treatments for these complaints. She denies weakness, dizziness, syncope.  OB History     Gravida  3   Para  2   Term  2   Preterm  0   AB  1   Living  1      SAB  1   IAB  0   Ectopic  0   Multiple  0   Live Births  1           Past Medical History:  Diagnosis Date   Anemia    Bronchitis    Bronchitis, acute    Depression    History of postpartum hypertension 05/24/2018   Irregular menstrual bleeding    Trichomonal vaginitis 10/29/2021   Trichomonosis 2016    Past Surgical History:  Procedure Laterality Date   WISDOM TOOTH EXTRACTION      Family History  Problem Relation Age of Onset   Asthma Mother     Social History   Tobacco Use   Smoking status: Former   Smokeless tobacco: Never  Scientific laboratory technician Use: Never used  Substance Use Topics   Alcohol use: No   Drug use: No    Allergies:  Allergies  Allergen Reactions   Tomato Hives    Medications Prior to Admission  Medication Sig Dispense Refill Last Dose   aspirin (ASPIRIN CHILDRENS) 81 MG chewable tablet Chew 1 tablet (81 mg total) by mouth daily. 30 tablet 3    aspirin EC 81 MG tablet Take 1 tablet (81 mg total) by mouth daily. 30 tablet 11    doxycycline (VIBRAMYCIN) 100 MG capsule Take 1 capsule (100 mg total) by mouth 2 (two) times daily. 20  capsule 0 Unknown   metroNIDAZOLE (FLAGYL) 500 MG tablet Take 1 tablet (500 mg total) by mouth 2 (two) times daily. 14 tablet 0 Unknown   Prenatal Vit-Fe Fumarate-FA (PRENATAL MULTIVITAMIN) TABS tablet Take 1 tablet by mouth daily at 12 noon. 90 tablet 3    Prenatal Vit-Fe Fumarate-FA (PRENATAL ONE DAILY) 27-0.8 MG TABS Take 1 tablet by mouth daily. 30 tablet 0 Unknown    Review of Systems  Gastrointestinal:  Positive for abdominal pain.  Genitourinary:  Positive for vaginal bleeding.  All other systems reviewed and are negative. Physical Exam   Blood pressure 119/63, pulse 80, temperature 99.3 F (37.4 C), temperature source Oral, resp. rate 20, SpO2 100 %, unknown if currently breastfeeding.  Physical Exam Vitals and nursing note reviewed. Exam conducted with a chaperone present.  Constitutional:      Appearance: She is well-developed.  Cardiovascular:     Rate and Rhythm: Normal rate and regular rhythm.     Heart sounds: Normal heart sounds.  Pulmonary:     Effort: Pulmonary effort is normal.     Breath sounds: Normal  breath sounds.  Abdominal:     Palpations: Abdomen is soft.     Tenderness: There is generalized abdominal tenderness.  Genitourinary:    Comments: Pelvic exam: External genitalia normal, vaginal walls pink and well rugated, cervix visually very slightly open, no lesions noted. Moderate amount of dark red blood near posterior fornix and cervical os. Removed with faux swab x 3. No bright red bleeding observed.   Skin:    Capillary Refill: Capillary refill takes less than 2 seconds.  Neurological:     Mental Status: She is alert and oriented to person, place, and time.  Psychiatric:        Mood and Affect: Mood normal.        Behavior: Behavior normal.    MAU Course/MDM  Procedures  Orders Placed This Encounter  Procedures   US OB LESS THAN 14 WEEKS WITH OB TRANSVAGINAL   CBC   hCG, quantitative, pregnancy   Urinalysis, Routine w reflex  microscopic Urine, Clean Catch   Type and screen   Insert peripheral IV   Meds ordered this encounter  Medications   lactated ringers bolus 1,000 mL   HYDROmorphone (DILAUDID) injection 1 mg    Miscarriage in progress   Patient Vitals for the past 24 hrs:  BP Temp Temp src Pulse Resp SpO2  11/01/21 1910 119/63 -- -- 80 -- --  11/01/21 1859 -- -- -- -- -- 100 %  11/01/21 1858 (!) 116/57 99.3 F (37.4 C) Oral 77 20 100 %   Report given to R. Renato Battles, CNM who assumes care of patient at this time.  Mallie Snooks, [place], MSN, CNM Certified Nurse Midwife, [facility] for [employer], [place] 11/01/21 8:14 PM    Assessment and Plan  ***   Laury Deep, CNM  11/01/2021 11:40 PM

## 2021-11-01 NOTE — MAU Provider Note (Addendum)
History     CSN: [id number]  Arrival date and time: 11/01/21 1845   Event Date/Time   First Provider Initiated Contact with Patient 11/01/21 1910      Chief Complaint  Patient presents with   Abdominal Pain   Vaginal Bleeding   HPI Michelle Greer is a 25 y.o. G3P2011 at Unknown who presents to MAU via EMS with heavy vaginal bleeding. Patient is s/p diagnosis of 9 week loss on 10/29/2021.  She reports heavy vaginal bleeding and abdominal pain throughout the day. She reports saturating a pad about every 3 hours. Her abdominal pain is suprapubic, pain score is 10/10. She has not taken medication or tried other treatments for these complaints. She denies weakness, dizziness, syncope.  OB History     Gravida  3   Para  2   Term  2   Preterm  0   AB  1   Living  1      SAB  1   IAB  0   Ectopic  0   Multiple  0   Live Births  1           Past Medical History:  Diagnosis Date   Anemia    Bronchitis    Bronchitis, acute    Depression    History of postpartum hypertension 05/24/2018   Irregular menstrual bleeding    Trichomonal vaginitis 10/29/2021   Trichomonosis 2016    Past Surgical History:  Procedure Laterality Date   WISDOM TOOTH EXTRACTION      Family History  Problem Relation Age of Onset   Asthma Mother     Social History   Tobacco Use   Smoking status: Former   Smokeless tobacco: Never  Scientific laboratory technician Use: Never used  Substance Use Topics   Alcohol use: No   Drug use: No    Allergies:  Allergies  Allergen Reactions   Tomato Hives    Medications Prior to Admission  Medication Sig Dispense Refill Last Dose   aspirin (ASPIRIN CHILDRENS) 81 MG chewable tablet Chew 1 tablet (81 mg total) by mouth daily. 30 tablet 3    aspirin EC 81 MG tablet Take 1 tablet (81 mg total) by mouth daily. 30 tablet 11    doxycycline (VIBRAMYCIN) 100 MG capsule Take 1 capsule (100 mg total) by mouth 2 (two) times daily. 20 capsule 0 Unknown    metroNIDAZOLE (FLAGYL) 500 MG tablet Take 1 tablet (500 mg total) by mouth 2 (two) times daily. 14 tablet 0 Unknown   Prenatal Vit-Fe Fumarate-FA (PRENATAL MULTIVITAMIN) TABS tablet Take 1 tablet by mouth daily at 12 noon. 90 tablet 3    Prenatal Vit-Fe Fumarate-FA (PRENATAL ONE DAILY) 27-0.8 MG TABS Take 1 tablet by mouth daily. 30 tablet 0 Unknown    Review of Systems  Constitutional: Negative.   HENT: Negative.    Eyes: Negative.   Respiratory: Negative.    Cardiovascular: Negative.   Gastrointestinal:  Positive for abdominal pain.  Endocrine: Negative.   Genitourinary:  Positive for pelvic pain and vaginal bleeding.  Musculoskeletal: Negative.   Skin: Negative.   Allergic/Immunologic: Negative.   Neurological: Negative.   Hematological: Negative.   Psychiatric/Behavioral: Negative.    All other systems reviewed and are negative. Physical Exam   Blood pressure 119/63, pulse 80, temperature 99.3 F (37.4 C), temperature source Oral, resp. rate 20, SpO2 100 %, unknown if currently breastfeeding.  Physical Exam Vitals and nursing note reviewed. Exam conducted with a  chaperone present.  Constitutional:      Appearance: She is well-developed.  Cardiovascular:     Rate and Rhythm: Normal rate and regular rhythm.     Heart sounds: Normal heart sounds.  Pulmonary:     Effort: Pulmonary effort is normal.     Breath sounds: Normal breath sounds.  Abdominal:     Palpations: Abdomen is soft.     Tenderness: There is generalized abdominal tenderness.  Genitourinary:    General: Normal vulva.     Comments: Pelvic exam: External genitalia normal, vaginal walls pink and well rugated, cervix visually very slightly open, no lesions noted. Moderate amount of dark red blood near posterior fornix and cervical os. Removed with faux swab x 3. No bright red bleeding observed.   Skin:    General: Skin is warm and dry.     Capillary Refill: Capillary refill takes less than 2 seconds.   Neurological:     Mental Status: She is alert and oriented to person, place, and time.  Psychiatric:        Mood and Affect: Mood normal.        Behavior: Behavior normal.    MAU Course/MDM  Procedures  Orders Placed This Encounter  Procedures   US OB LESS THAN 14 WEEKS WITH OB TRANSVAGINAL   CBC   hCG, quantitative, pregnancy   Urinalysis, Routine w reflex microscopic Urine, Clean Catch   Type and screen   Insert peripheral IV   Meds ordered this encounter  Medications   lactated ringers bolus 1,000 mL   HYDROmorphone (DILAUDID) injection 1 mg    Miscarriage in progress   Patient Vitals for the past 24 hrs:  BP Temp Temp src Pulse Resp SpO2  11/01/21 1910 119/63 -- -- 80 -- --  11/01/21 1859 -- -- -- -- -- 100 %  11/01/21 1858 (!) 116/57 99.3 F (37.4 C) Oral 77 20 100 %   Report given to R. Renato Battles, CNM who assumes care of patient at this time.  Mallie Snooks, MSA, MSN, CNM Certified Nurse Midwife, [facility] for [employer], [place] [facility] 11/01/21 8:14 PM  US OB Transvaginal  Result Date: 11/01/2021 CLINICAL DATA:  Heavy vaginal bleeding.  Missed abortion. EXAM: TRANSVAGINAL OB ULTRASOUND TECHNIQUE: Transvaginal ultrasound was performed for complete evaluation of the gestation as well as the maternal uterus, adnexal regions, and pelvic cul-de-sac. COMPARISON:  Ultrasound dated 10/29/2021. FINDINGS: The uterus is anteverted and appears unremarkable. The endometrium measures 12 mm in thickness. The previously seen intrauterine pregnancy is no longer visualized in keeping with miscarriage. Echogenic content within the endometrium with areas of increased endometrial vascularity noted. Correlation with a chest tube levels recommended to exclude retained product of conception. The ovaries are unremarkable. There is a small corpus luteum in the right ovary. No significant free fluid in the pelvis. IMPRESSION: 1. No intrauterine pregnancy  identified in keeping with miscarriage. 2. Mildly thickened endometrium with echogenic content and areas of increased vascularity. Correlation with clinical exam and HCG levels recommended to exclude retained product of conception. Electronically Signed   By: Anner Crete M.D.   On: 11/01/2021 20:51     Assessment and Plan  Miscarriage  - Information provided on miscarriage and managing pregnancy loss - Patient and mother both expressed that she didn't start having bleeding until "they (at [facility]) started pushing on my stomach and doing what they were doing to me. I think they killed my baby." - Reviewed U/S results with  patient and her mother and explained that there is no possible way the exam done at [facility] "killed" her baby. The baby had no HB on U/S and there was a [facility]; which could also cause bleeding. Miscarriages happen for a number of reasons so it is hard to point the blame or pinpoint a specific reason why.  [redacted] weeks gestation of pregnancy    - Discharge patient - Someone from [place] will call you to scheduled repeat HCG in 1 week and to see a provider in 2 weeks. Advised to call, if not heard from [facility] office by Tuesday  11/03/21. - Patient verbalized an understanding of the plan of care and agrees.    Laury Deep, CNM  11/01/2021 11:40 PM

## 2021-11-01 NOTE — MAU Note (Signed)
Pt up to bathroom -UA collected-pt reported she passed a golf ball size clot while in bathroom. Pt continues to have small amount of red smear on peri pad.

## 2021-11-01 NOTE — MAU Note (Signed)
Patient came into MAU via EMS with c/o a miscarriage 12/9 and is now having heavy vaginal bleeding and abdominal pain. Patient showed this nurse pictures on her phone of the products of conception that she passed. Patient denies having a fever.

## 2021-11-12 ENCOUNTER — Ambulatory Visit: Payer: Medicaid Other

## 2021-12-27 IMAGING — US US OB COMP LESS 14 WK
1 series · 15 of 28 positions shown · non-contrast
Comparison: None.

CLINICAL DATA: Bleeding during pregnancy.

EXAM:
OBSTETRIC <14 WK ULTRASOUND
TECHNIQUE: Transabdominal ultrasound was performed for evaluation of the
gestation as well as the maternal uterus and adnexal regions.

[Series 1: us ob comp less 14 wks mc & wl · 15 of 35 slices shown]
[im 1/35]
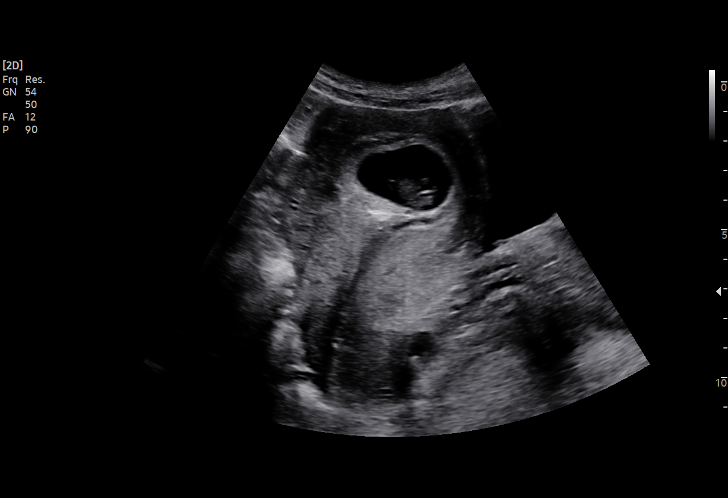
[im 3/35]
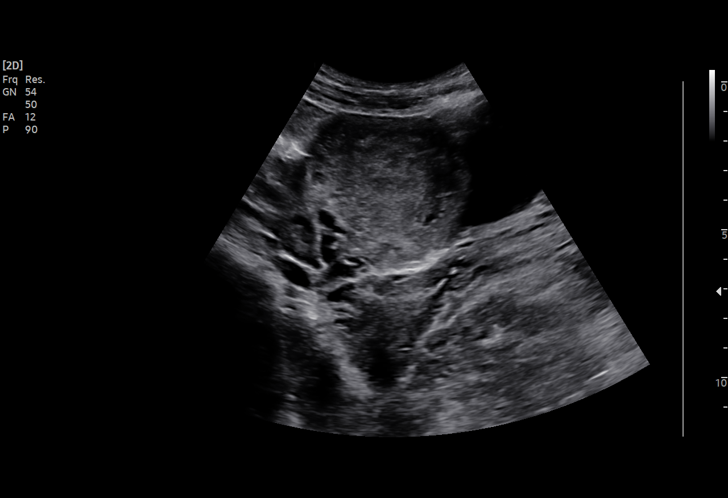
[im 6/35]
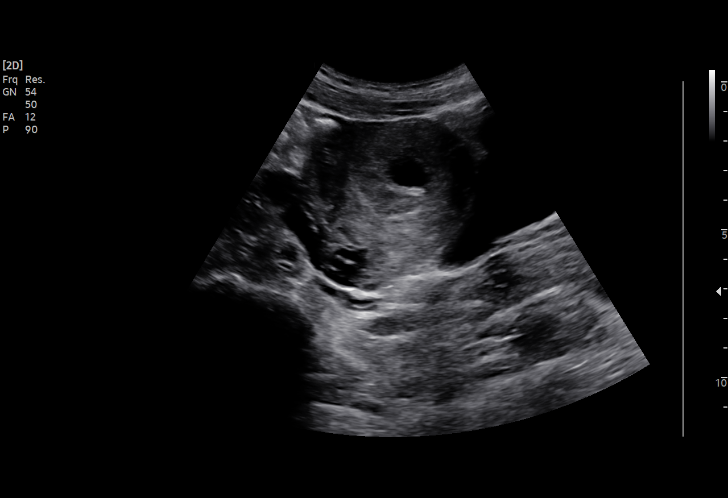
[im 8/35]
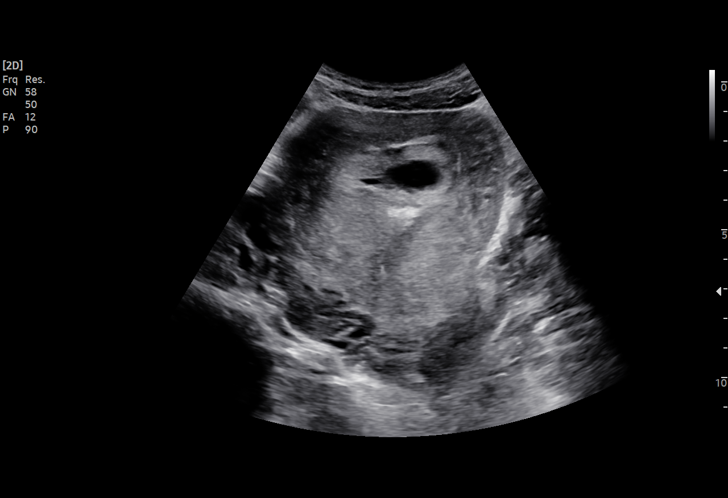
[im 11/35]
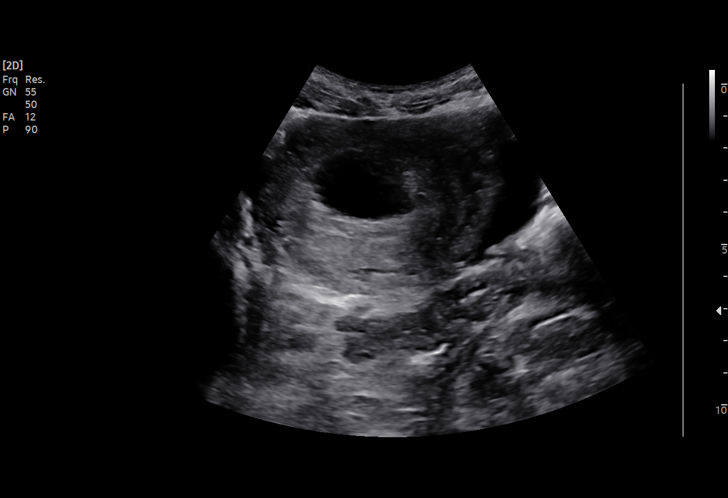
[im 13/35]
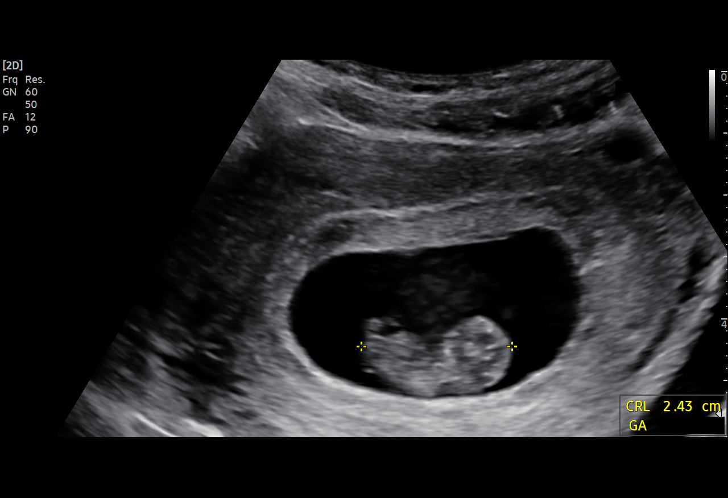
[im 16/35]
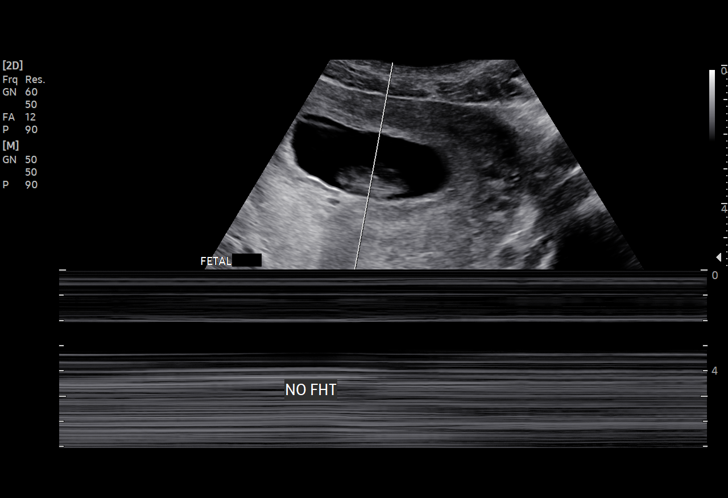
[im 18/35]
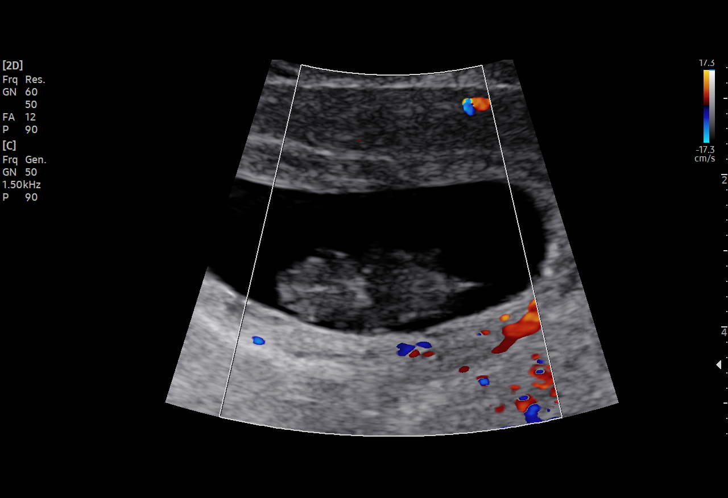
[im 19/35]
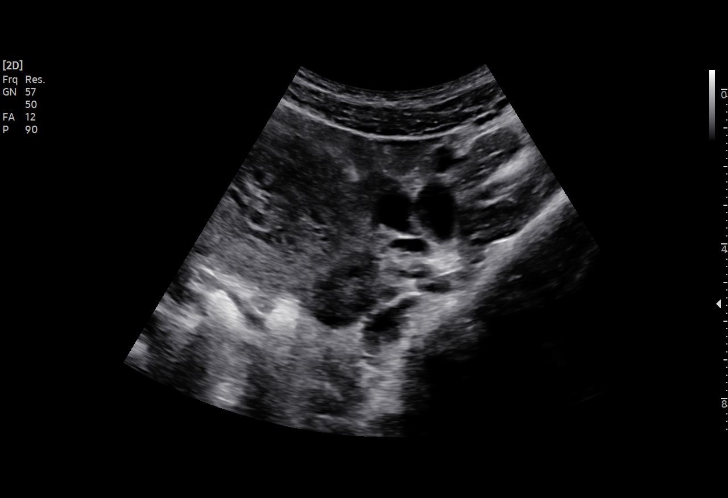
[im 22/35]
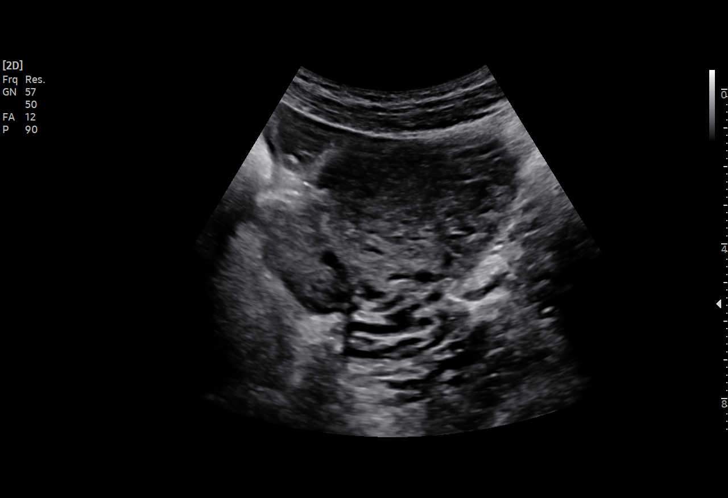
[im 24/35]
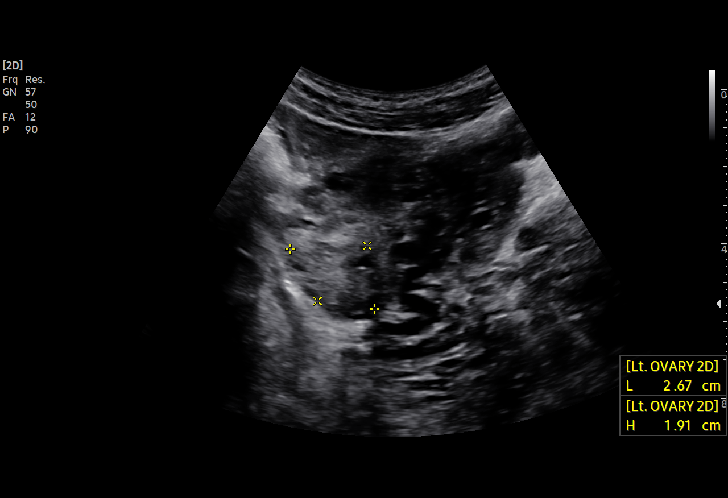
[im 27/35]
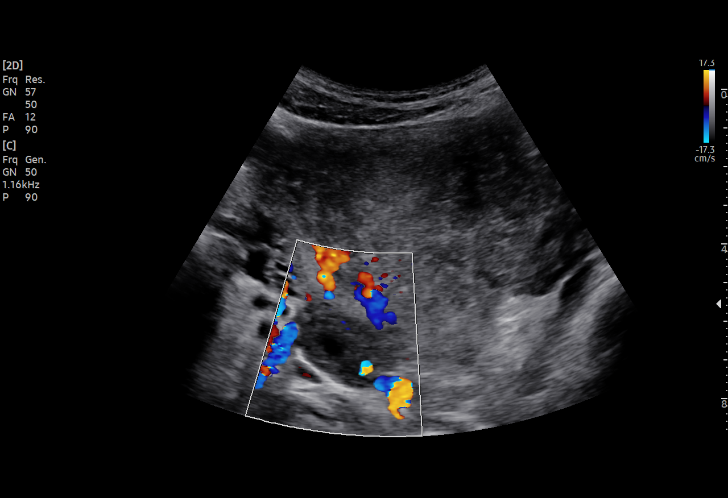
[im 29/35]
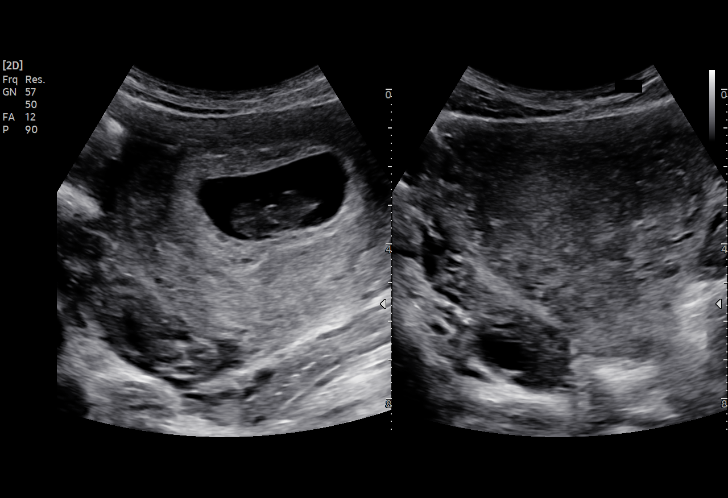
[im 32/35]
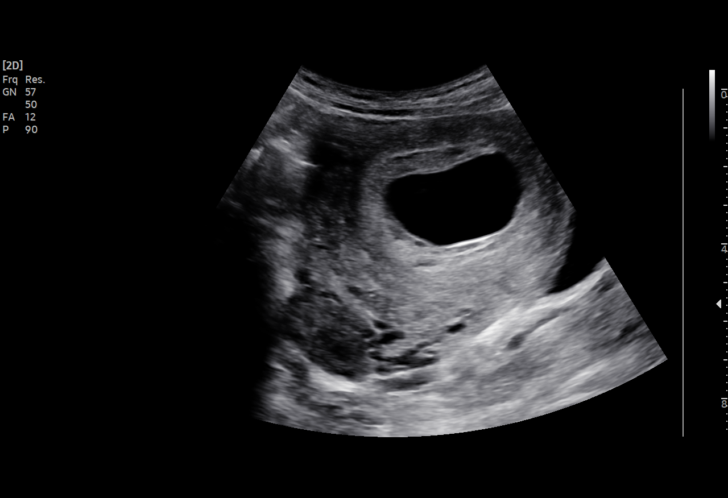
[im 35/35]
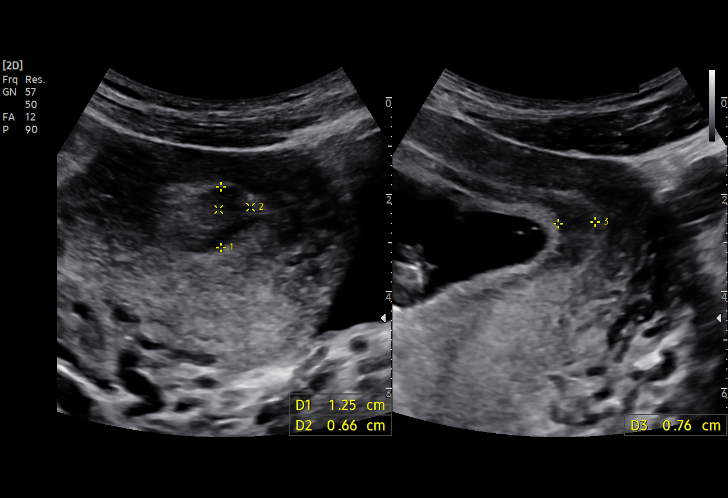

[15 of 28 positions shown; findings below may reference images not displayed]

FINDINGS: Intrauterine gestational sac: Single

Yolk sac:  Not Visualized.

Embryo:  Visualized.

Cardiac Activity: Not Visualized.

CRL:   24.1 mm   9 w 1 d                  US EDC: 06/02/2022

Subchorionic hemorrhage: Small left subchorionic hemorrhage present.

Maternal uterus/adnexae: Bilateral ovaries within normal limits
IMPRESSION: 1. Single failed intrauterine gestation measuring 9 weeks 1 day by
crown-rump length.
2. Small subchorionic hemorrhage.

## 2021-12-30 IMAGING — US US OB TRANSVAGINAL
1 series · 15 of 28 positions shown · non-contrast
Comparison: Ultrasound dated 10/29/2021.

CLINICAL DATA: Heavy vaginal bleeding.  Missed abortion.

EXAM:
TRANSVAGINAL OB ULTRASOUND
TECHNIQUE: Transvaginal ultrasound was performed for complete evaluation of the
gestation as well as the maternal uterus, adnexal regions, and
pelvic cul-de-sac.

[Series 1: us ob transvaginal · 37 acquisitions, 15 frames shown]
[im 1/37]
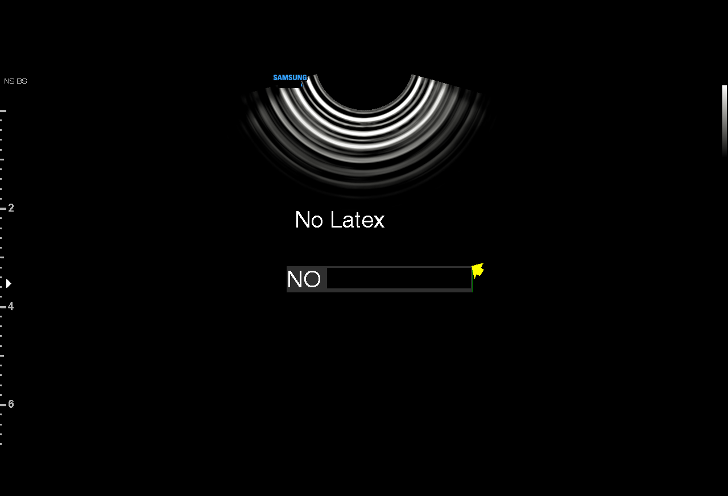
[im 3/37]
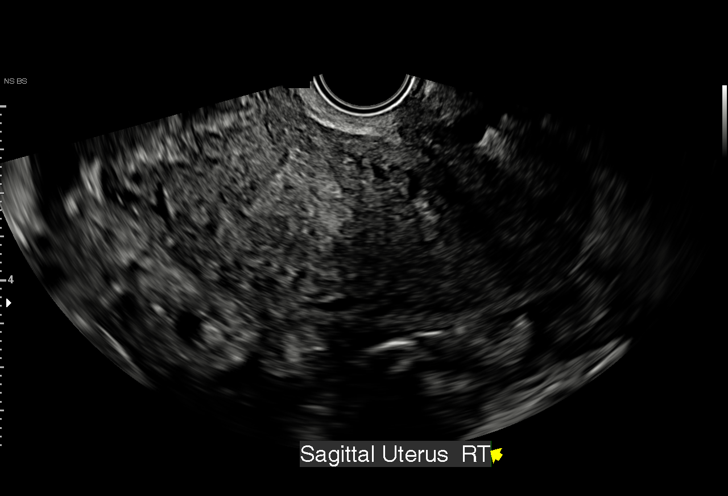
[im 6/37]
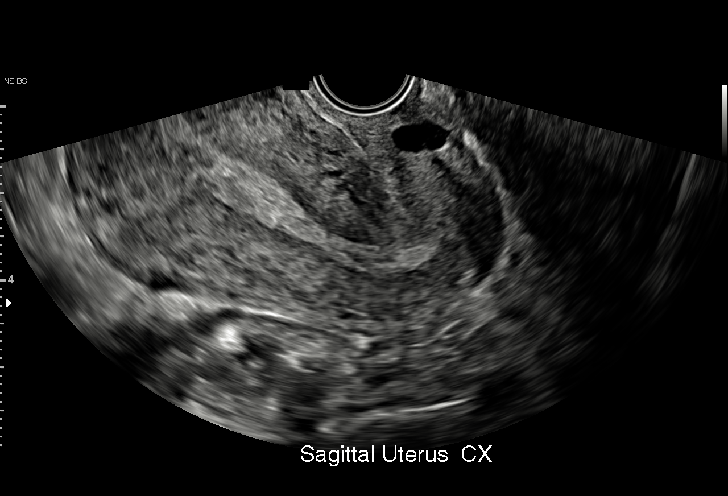
[im 9/37]
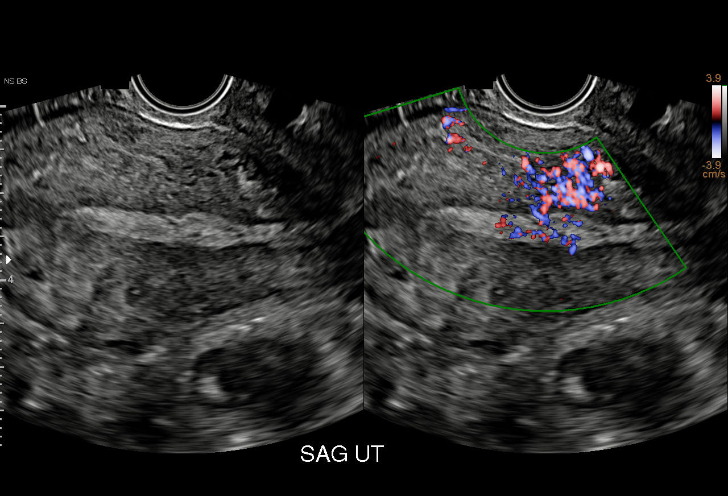
[im 11/37]
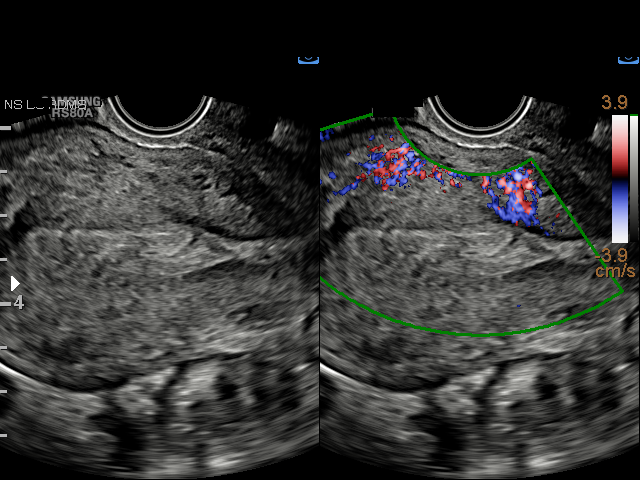
[im 14/37]
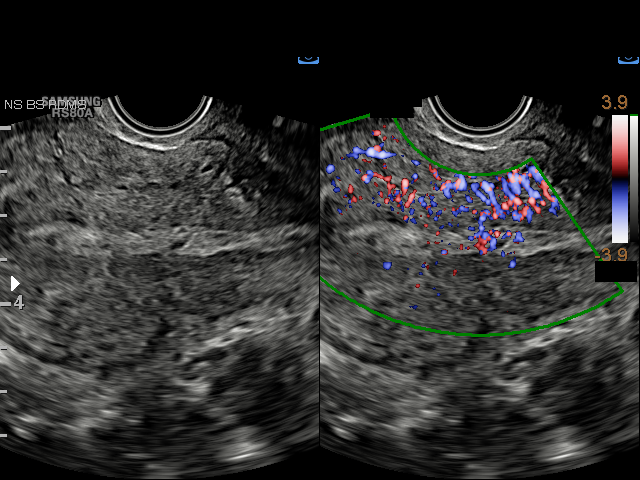
[im 17/37]
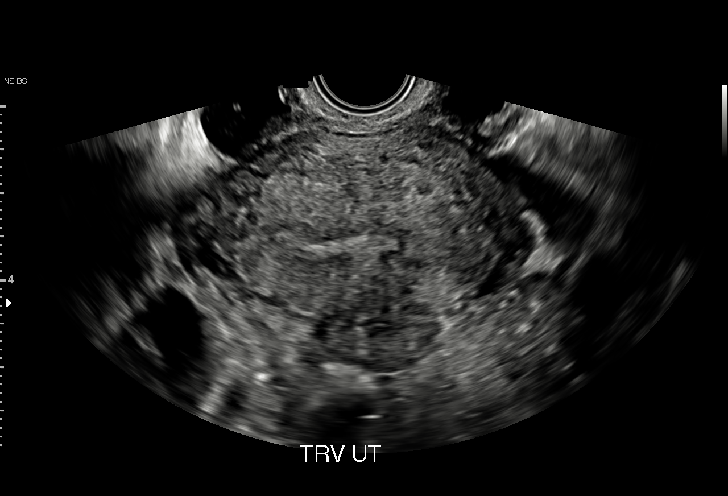
[im 19/37]
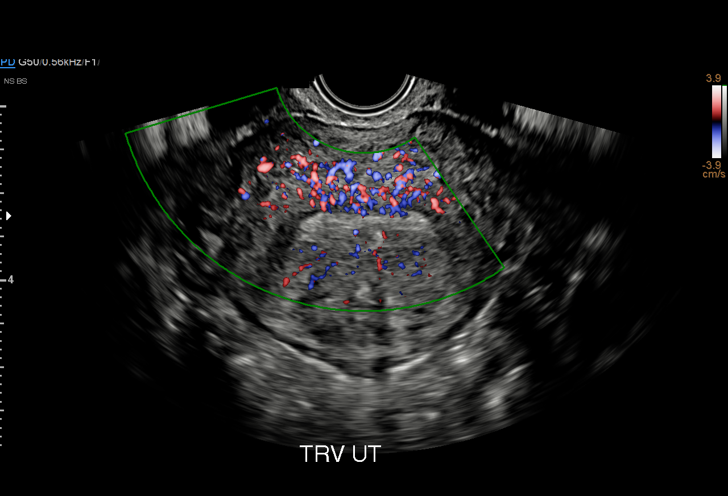
[im 21/37]
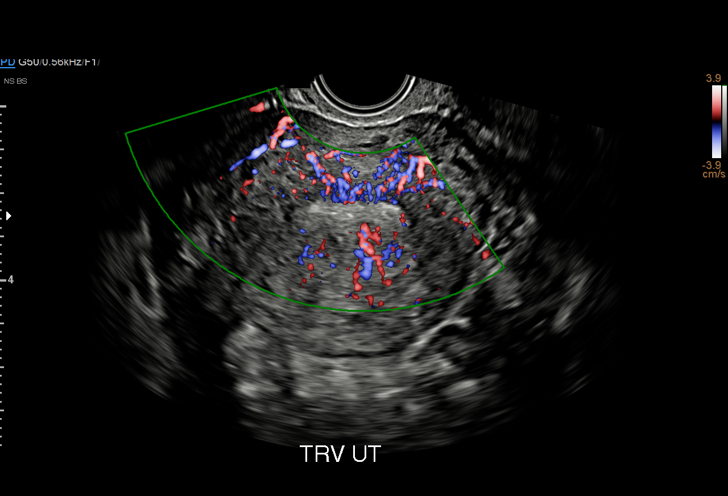
[im 23/37]
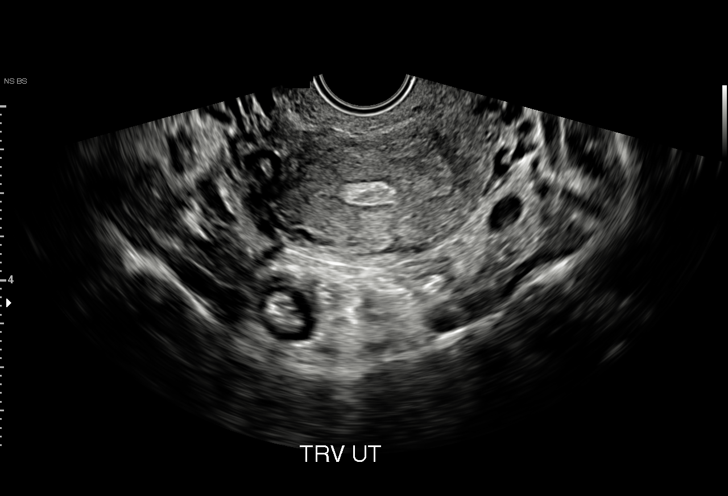
[im 26/37]
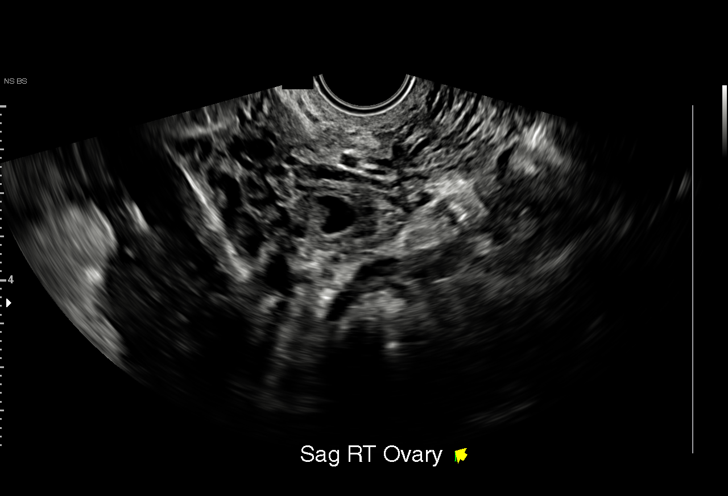
[im 29/37]
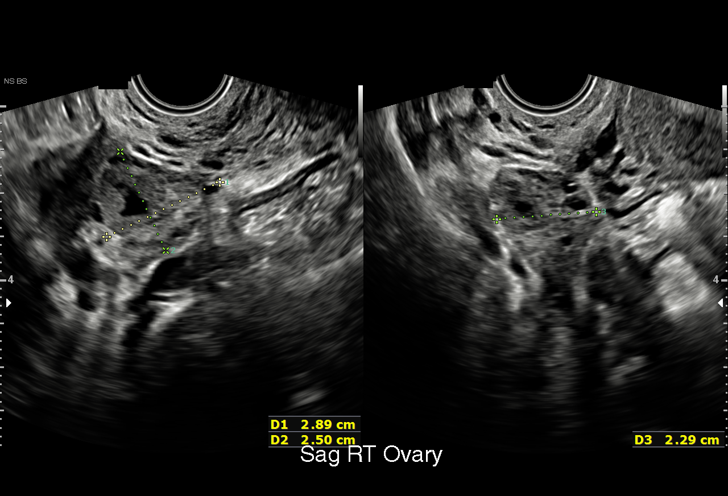
[im 31/37]
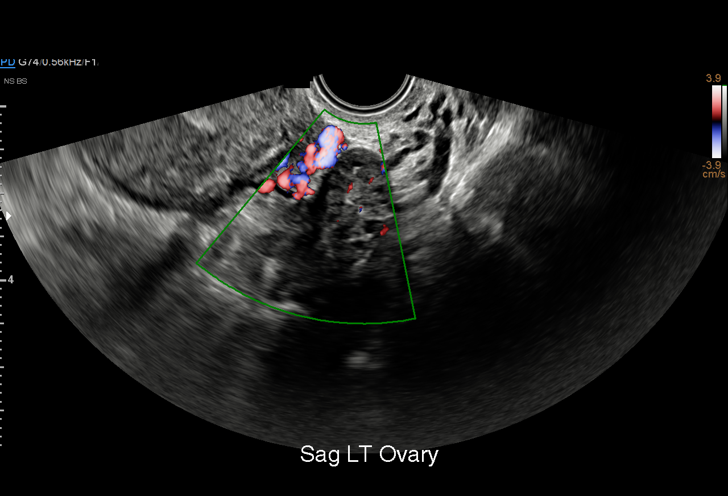
[im 34/37]
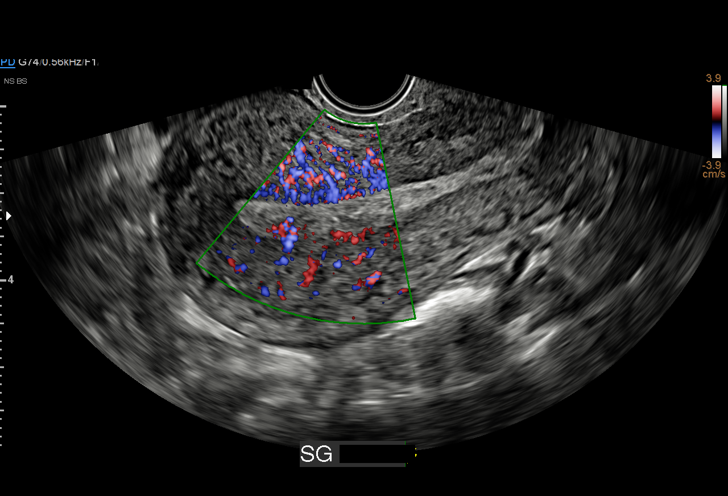
[im 37/37]
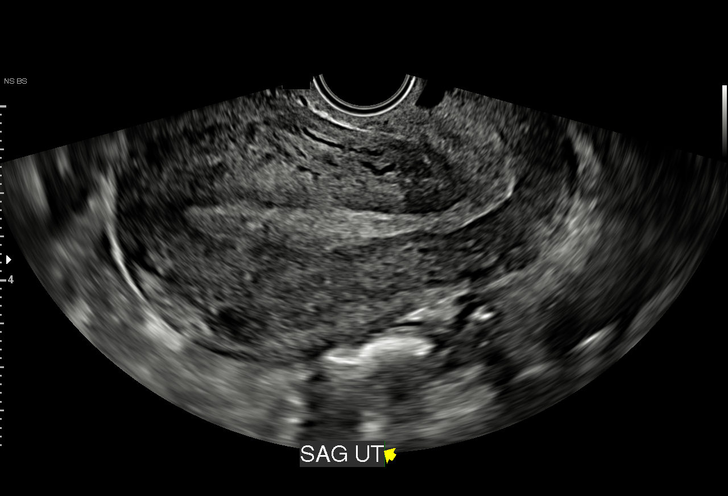

[15 of 28 positions shown; findings below may reference images not displayed]

FINDINGS: The uterus is anteverted and appears unremarkable.

The endometrium measures 12 mm in thickness. The previously seen
intrauterine pregnancy is no longer visualized in keeping with
miscarriage.

Echogenic content within the endometrium with areas of increased
endometrial vascularity noted. Correlation with a chest tube levels
recommended to exclude retained product of conception.

The ovaries are unremarkable. There is a small corpus luteum in the
right ovary.

No significant free fluid in the pelvis.
IMPRESSION: 1. No intrauterine pregnancy identified in keeping with miscarriage.
2. Mildly thickened endometrium with echogenic content and areas of
increased vascularity. Correlation with clinical exam and HCG levels
recommended to exclude retained product of conception.

## 2022-08-29 ENCOUNTER — Other Ambulatory Visit: Payer: Self-pay

## 2022-08-29 ENCOUNTER — Emergency Department (HOSPITAL_BASED_OUTPATIENT_CLINIC_OR_DEPARTMENT_OTHER)
Admission: EM | Admit: 2022-08-29 | Discharge: 2022-08-29 | Disposition: A | Discharge status: Home / Self Care | Payer: Medicaid Other | Attending: Emergency Medicine | Admitting: Emergency Medicine

## 2022-08-29 ENCOUNTER — Encounter (HOSPITAL_BASED_OUTPATIENT_CLINIC_OR_DEPARTMENT_OTHER): Payer: Self-pay | Admitting: Emergency Medicine

## 2022-08-29 DIAGNOSIS — O26899 Other specified pregnancy related conditions, unspecified trimester: Secondary | ICD-10-CM | POA: Diagnosis present

## 2022-08-29 DIAGNOSIS — O98519 Other viral diseases complicating pregnancy, unspecified trimester: Secondary | ICD-10-CM | POA: Insufficient documentation

## 2022-08-29 DIAGNOSIS — R110 Nausea: Secondary | ICD-10-CM | POA: Insufficient documentation

## 2022-08-29 DIAGNOSIS — Z3A00 Weeks of gestation of pregnancy not specified: Secondary | ICD-10-CM | POA: Insufficient documentation

## 2022-08-29 DIAGNOSIS — U071 COVID-19: Secondary | ICD-10-CM | POA: Insufficient documentation

## 2022-08-29 DIAGNOSIS — A599 Trichomoniasis, unspecified: Secondary | ICD-10-CM | POA: Diagnosis not present

## 2022-08-29 DIAGNOSIS — O98919 Unspecified maternal infectious and parasitic disease complicating pregnancy, unspecified trimester: Secondary | ICD-10-CM | POA: Insufficient documentation

## 2022-08-29 DIAGNOSIS — R8271 Bacteriuria: Secondary | ICD-10-CM | POA: Insufficient documentation

## 2022-08-29 DIAGNOSIS — O99891 Other specified diseases and conditions complicating pregnancy: Secondary | ICD-10-CM

## 2022-08-29 LAB — SARS CORONAVIRUS 2 BY RT PCR: SARS Coronavirus 2 by RT PCR: POSITIVE — AB

## 2022-08-29 LAB — URINALYSIS, ROUTINE W REFLEX MICROSCOPIC
Bilirubin Urine: NEGATIVE
Glucose, UA: NEGATIVE mg/dL
Hgb urine dipstick: NEGATIVE
Ketones, ur: NEGATIVE mg/dL
Nitrite: NEGATIVE
Protein, ur: NEGATIVE mg/dL
Specific Gravity, Urine: 1.02 (ref 1.005–1.030)
pH: 7 (ref 5.0–8.0)

## 2022-08-29 LAB — URINALYSIS, MICROSCOPIC (REFLEX)

## 2022-08-29 LAB — PREGNANCY, URINE: Preg Test, Ur: POSITIVE — AB

## 2022-08-29 MED ORDER — DOXYLAMINE SUCCINATE (SLEEP) 25 MG PO TABS: 25.0000 mg | ORAL_TABLET | Freq: Every evening | ORAL | 0 refills | Status: DC | PRN

## 2022-08-29 MED ORDER — CEFADROXIL 500 MG PO CAPS: 500.0000 mg | ORAL_CAPSULE | Freq: Two times a day (BID) | ORAL | 0 refills | Status: DC

## 2022-08-29 MED ORDER — METRONIDAZOLE 500 MG PO TABS: 500.0000 mg | ORAL_TABLET | Freq: Two times a day (BID) | ORAL | 0 refills | Status: DC

## 2022-08-29 MED ORDER — NIRMATRELVIR/RITONAVIR (PAXLOVID)TABLET: 3.0000 | ORAL_TABLET | Freq: Two times a day (BID) | ORAL | 0 refills | Status: AC

## 2022-08-29 MED ORDER — ONDANSETRON 4 MG PO TBDP
4.0000 mg | ORAL_TABLET | Freq: Once | ORAL | Status: AC
  Administered 2022-08-29: 4 mg via ORAL
  Filled 2022-08-29: qty 1

## 2022-08-29 NOTE — Discharge Instructions (Signed)
Please follow up with OBGYN to evaluate your current pregnancy, return to the ED as needed for worsening symptoms.  Take all of the medications I prescribed as directed.

## 2022-08-29 NOTE — ED Triage Notes (Signed)
Pt reports generalized body aches; nausea; pregnant, but not sure how far along (thinks LMP was either 6/4 or 7/4)

## 2022-08-29 NOTE — ED Provider Notes (Signed)
[facility] Provider Note   CSN: [id number] Arrival date & time: 08/29/22  1743     History  Chief Complaint  Patient presents with   Generalized Body Aches   Nausea    Michelle Greer is a 26 y.o. female with past medical history significant for bronchitis, previous trichomoniasis resents with concern for generalized body aches that started yesterday, nausea, as well has some loss of sense of taste and smell.  Patient is currently pregnant, reports that last menstrual period was in July.  She does not currently have an OB/GYN, and has not had a confirmatory ultrasound.  She denies any lower abdominal pain, cramping, vaginal bleeding, vaginal discharge.  He reports she does feel some chills, denies any fever.  She denies diarrhea.  She reports that she has had low appetite with her nausea.  HPI     Home Medications Prior to Admission medications   Medication Sig Start Date End Date Taking? Authorizing Provider  cefadroxil (DURICEF) 500 MG capsule Take 1 capsule (500 mg total) by mouth 2 (two) times daily. 08/29/22  Yes Gweneth Fredlund H, PA-C  doxylamine, Sleep, (UNISOM) 25 MG tablet Take 1 tablet (25 mg total) by mouth at bedtime as needed (Nausea in pregnancy). 08/29/22  Yes Juston Goheen H, PA-C  metroNIDAZOLE (FLAGYL) 500 MG tablet Take 1 tablet (500 mg total) by mouth 2 (two) times daily. Take 1 tablet by mouth twice daily for 7 days, double dosage given for expedited partner therapy 08/29/22  Yes Aiyanah Kalama H, PA-C  nirmatrelvir/ritonavir EUA (PAXLOVID) 20 x 150 MG & 10 x '100MG'$  TABS Take 3 tablets by mouth 2 (two) times daily for 5 days. Patient GFR is >60. Take nirmatrelvir (150 mg) two tablets twice daily for 5 days and ritonavir (100 mg) one tablet twice daily for 5 days. 08/29/22 09/03/22 Yes Shaquil Aldana H, PA-C  doxycycline (VIBRAMYCIN) 100 MG capsule Take 1 capsule (100 mg total) by mouth 2 (two) times daily. 10/29/21   Evlyn Courier, PA-C  Prenatal Vit-Fe Fumarate-FA (PRENATAL MULTIVITAMIN) TABS tablet Take 1 tablet by mouth daily at 12 noon. 05/19/18   Chancy Milroy, MD  Prenatal Vit-Fe Fumarate-FA (PRENATAL ONE DAILY) 27-0.8 MG TABS Take 1 tablet by mouth daily. 08/24/18   Laury Deep, CNM      Allergies    Tomato    Review of Systems   Review of Systems  HENT:  Positive for sore throat.   Respiratory:  Positive for cough.   All other systems reviewed and are negative.   Physical Exam Updated Vital Signs BP 114/65 (BP Location: Right Arm)   Pulse 86   Temp 98.9 F (37.2 C) (Oral)   Resp 18   Ht '5\' 5"'$  (1.651 m)   Wt 61.7 kg   LMP 04/25/2022   SpO2 100%   BMI 22.63 kg/m  Physical Exam Vitals and nursing note reviewed.  Constitutional:      General: She is not in acute distress.    Appearance: Normal appearance.  HENT:     Head: Normocephalic and atraumatic.  Eyes:     General:        Right eye: No discharge.        Left eye: No discharge.  Cardiovascular:     Rate and Rhythm: Normal rate and regular rhythm.     Heart sounds: No murmur heard.    No friction rub. No gallop.  Pulmonary:     Effort: Pulmonary effort is  normal.     Breath sounds: Normal breath sounds.     Comments: Normal breath sounds on my exam, no respiratory distress, wheezing, rhonchi, stridor Abdominal:     General: Bowel sounds are normal.     Palpations: Abdomen is soft.     Comments: Patient with uterine fundus palpable below umbilicus, seemingly just above pelvic brim, nontender to palpation on exam.  Skin:    General: Skin is warm and dry.     Capillary Refill: Capillary refill takes less than 2 seconds.  Neurological:     Mental Status: She is alert and oriented to person, place, and time.  Psychiatric:        Mood and Affect: Mood normal.        Behavior: Behavior normal.     ED Results / Procedures / Treatments   Labs (all labs ordered are listed, but only abnormal results are displayed) Labs  Reviewed  SARS CORONAVIRUS 2 BY RT PCR - Abnormal; Notable for the following components:      Result Value   SARS Coronavirus 2 by RT PCR POSITIVE (*)    All other components within normal limits  PREGNANCY, URINE - Abnormal; Notable for the following components:   Preg Test, Ur POSITIVE (*)    All other components within normal limits  URINALYSIS, ROUTINE W REFLEX MICROSCOPIC - Abnormal; Notable for the following components:   Leukocytes,Ua SMALL (*)    All other components within normal limits  URINALYSIS, MICROSCOPIC (REFLEX) - Abnormal; Notable for the following components:   Bacteria, UA FEW (*)    Trichomonas, UA PRESENT (*)    All other components within normal limits    EKG None  Radiology No results found.  Procedures Procedures    Medications Ordered in ED Medications  ondansetron (ZOFRAN-ODT) disintegrating tablet 4 mg (4 mg Oral Given 08/29/22 1818)    ED Course/ Medical Decision Making/ A&P                           Medical Decision Making Amount and/or Complexity of Data Reviewed Labs: ordered.  Risk OTC drugs. Prescription drug management.   This is an overall well-appearing patient who is 26 years old, with past medical history significant for asthma, bronchitis, previous trichomoniasis who presents with concern for generalized body aches, nausea.  Patient also reports that she is pregnant, has not seen OB/GYN for confirmatory ultrasound, but reports last menstrual period likely in July.  Suspicious that patient may be having some nausea related to her pregnancy, however with the generalized body aches and other upper respiratory prodrome suspicious for COVID, flu, TIA, versus other abnormality at this time.  Based on her clinical presentation, stable vital signs, normal oxygen saturation on room air, and benign respiratory exam, I have low suspicion for developing pneumonia, acute bronchitis.  Independently interpreted lab work including UA which is  positive for few bacteria, some white blood cells, trichomonas, pregnancy test is positive, COVID test is also positive.  Discussed with patient that her symptoms are consistent with COVID-19 infection, as her symptoms have only been present for 2 days ago be reasonable to treat her with Paxlovid, I spoke with the pharmacist to agrees with this plan in a pregnant patient.  We will treat her asymptomatic bacteria as well as trichomonas with Flagyl, cefadroxil, and discussed that nausea medications are somewhat limited in pregnancy but will trial doxylamine to help with her nausea at this time.  She is tolerating p.o. at time of discharge, and vital signs remained stable.  She is encouraged to follow-up with OB/GYN for confirmatory ultrasound, return to the emergency department she experiences significant abdominal cramping, vaginal bleeding, vaginal discharge.  Patient is present with her partner and reports that she is not having any other sexual contact at this time, we will give her enough Flagyl to perform expedited partner therapy. Final Clinical Impression(s) / ED Diagnoses Final diagnoses:  COVID-19  Nausea  Trichomonas infection  Bacteriuria in pregnancy    Rx / DC Orders ED Discharge Orders          Ordered    doxylamine, Sleep, (UNISOM) 25 MG tablet  At bedtime PRN        08/29/22 2045    nirmatrelvir/ritonavir EUA (PAXLOVID) 20 x 150 MG & 10 x '100MG'$  TABS  2 times daily        08/29/22 2045    cefadroxil (DURICEF) 500 MG capsule  2 times daily        08/29/22 2045    metroNIDAZOLE (FLAGYL) 500 MG tablet  2 times daily        08/29/22 2045              Dorien Chihuahua 08/29/22 2126    Malvin Johns, MD 08/29/22 2205

## 2022-08-29 NOTE — ED Notes (Signed)
Pt c/o body aches and nausea, states she is pregnant

## 2022-09-04 ENCOUNTER — Encounter (HOSPITAL_COMMUNITY): Payer: Self-pay | Admitting: Emergency Medicine

## 2022-09-04 ENCOUNTER — Other Ambulatory Visit: Payer: Self-pay

## 2022-09-04 ENCOUNTER — Emergency Department (HOSPITAL_COMMUNITY): Payer: Medicaid Other

## 2022-09-04 ENCOUNTER — Emergency Department (HOSPITAL_COMMUNITY)
Admission: EM | Admit: 2022-09-04 | Discharge: 2022-09-05 | Discharge status: Against Medical Advice | Payer: Medicaid Other | Attending: Student | Admitting: Student

## 2022-09-04 DIAGNOSIS — Z5321 Procedure and treatment not carried out due to patient leaving prior to being seen by health care provider: Secondary | ICD-10-CM | POA: Insufficient documentation

## 2022-09-04 DIAGNOSIS — O99611 Diseases of the digestive system complicating pregnancy, first trimester: Secondary | ICD-10-CM | POA: Diagnosis not present

## 2022-09-04 DIAGNOSIS — R1030 Lower abdominal pain, unspecified: Secondary | ICD-10-CM | POA: Insufficient documentation

## 2022-09-04 LAB — CBC WITH DIFFERENTIAL/PLATELET
Abs Immature Granulocytes: 0.03 10*3/uL (ref 0.00–0.07)
Basophils Absolute: 0 10*3/uL (ref 0.0–0.1)
Basophils Relative: 0 %
Eosinophils Absolute: 0 10*3/uL (ref 0.0–0.5)
Eosinophils Relative: 0 %
HCT: 35.6 % — ABNORMAL LOW (ref 36.0–46.0)
Hemoglobin: 12 g/dL (ref 12.0–15.0)
Immature Granulocytes: 1 %
Lymphocytes Relative: 29 %
Lymphs Abs: 1.9 10*3/uL (ref 0.7–4.0)
MCH: 29.4 pg (ref 26.0–34.0)
MCHC: 33.7 g/dL (ref 30.0–36.0)
MCV: 87.3 fL (ref 80.0–100.0)
Monocytes Absolute: 0.6 10*3/uL (ref 0.1–1.0)
Monocytes Relative: 10 %
Neutro Abs: 3.8 10*3/uL (ref 1.7–7.7)
Neutrophils Relative %: 60 %
Platelets: 304 10*3/uL (ref 150–400)
RBC: 4.08 MIL/uL (ref 3.87–5.11)
RDW: 17.5 % — ABNORMAL HIGH (ref 11.5–15.5)
WBC: 6.3 10*3/uL (ref 4.0–10.5)
nRBC: 0 % (ref 0.0–0.2)

## 2022-09-04 NOTE — ED Provider Triage Note (Signed)
Emergency Medicine Provider Triage Evaluation Note  Michelle Greer , a 26 y.o. female  was evaluated in triage.  Pt complains of bilateral pelvic pain.  Reports pain diagnosed with COVID on 10/8.  She was unable to get any medications as she was pregnant.  Now she is complaining of bilateral pelvic pain.  Reports she has random sharp pains in the bilateral groin area.  Has not noted any bleeding.  Has not had any prenatal care and believes she is around 19 weeks.  Says that she is taking a prenatal vitamin but has not seen an OB or had any ultrasound to confirm intrauterine pregnancy  Review of Systems  Positive: Lower abdominal pain Negative: Vomiting, vaginal bleeding or cramping  Physical Exam  BP (!) 119/93   Pulse 80   Resp 18   LMP 04/25/2022   SpO2 100%  Gen:   Awake, no distress   Resp:  Normal effort  MSK:   Moves extremities without difficulty  Other:  Low abdominal tenderness  Medical Decision Making  Medically screening exam initiated at 10:59 PM.  Appropriate orders placed.  Michelle Greer was informed that the remainder of the evaluation will be completed by another provider, this initial triage assessment does not replace that evaluation, and the importance of remaining in the ED until their evaluation is complete.     Rhae Hammock, PA-C 09/04/22 2301

## 2022-09-04 NOTE — ED Triage Notes (Signed)
Pt reports that she is [redacted] weeks pregnant, recently diagnosed with covid and having lower abd pain and groin pain.

## 2022-09-05 LAB — COMPREHENSIVE METABOLIC PANEL
ALT: 9 U/L (ref 0–44)
AST: 14 U/L — ABNORMAL LOW (ref 15–41)
Albumin: 4 g/dL (ref 3.5–5.0)
Alkaline Phosphatase: 43 U/L (ref 38–126)
Anion gap: 5 (ref 5–15)
BUN: 10 mg/dL (ref 6–20)
CO2: 23 mmol/L (ref 22–32)
Calcium: 9 mg/dL (ref 8.9–10.3)
Chloride: 109 mmol/L (ref 98–111)
Creatinine, Ser: 0.6 mg/dL (ref 0.44–1.00)
GFR, Estimated: >60 mL/min (ref >60)
Glucose, Bld: 85 mg/dL (ref 70–99)
Potassium: 4 mmol/L (ref 3.5–5.1)
Sodium: 137 mmol/L (ref 135–145)
Total Bilirubin: 0.5 mg/dL (ref 0.3–1.2)
Total Protein: 7.4 g/dL (ref 6.5–8.1)

## 2022-09-05 LAB — HCG, QUANTITATIVE, PREGNANCY: hCG, Beta Chain, Quant, S: 31350 m[IU]/mL — ABNORMAL HIGH (ref <5)

## 2022-09-05 LAB — LIPASE, BLOOD: Lipase: 34 U/L (ref 11–51)

## 2022-09-06 ENCOUNTER — Other Ambulatory Visit: Payer: Self-pay

## 2022-09-06 ENCOUNTER — Inpatient Hospital Stay (HOSPITAL_COMMUNITY)
Admission: AD | Admit: 2022-09-06 | Discharge: 2022-09-06 | Disposition: A | Discharge status: Home / Self Care | Payer: Medicaid Other | Attending: Family Medicine | Admitting: Family Medicine

## 2022-09-06 ENCOUNTER — Encounter (HOSPITAL_COMMUNITY): Payer: Self-pay

## 2022-09-06 DIAGNOSIS — U071 COVID-19: Secondary | ICD-10-CM

## 2022-09-06 DIAGNOSIS — O98519 Other viral diseases complicating pregnancy, unspecified trimester: Secondary | ICD-10-CM | POA: Diagnosis not present

## 2022-09-06 DIAGNOSIS — O26892 Other specified pregnancy related conditions, second trimester: Secondary | ICD-10-CM | POA: Insufficient documentation

## 2022-09-06 DIAGNOSIS — R0789 Other chest pain: Secondary | ICD-10-CM | POA: Insufficient documentation

## 2022-09-06 DIAGNOSIS — M940 Chondrocostal junction syndrome [Tietze]: Secondary | ICD-10-CM

## 2022-09-06 DIAGNOSIS — Z3A15 15 weeks gestation of pregnancy: Secondary | ICD-10-CM | POA: Diagnosis not present

## 2022-09-06 LAB — URINALYSIS, ROUTINE W REFLEX MICROSCOPIC
Bilirubin Urine: NEGATIVE
Glucose, UA: NEGATIVE mg/dL
Hgb urine dipstick: NEGATIVE
Ketones, ur: 5 mg/dL — AB
Leukocytes,Ua: NEGATIVE
Nitrite: NEGATIVE
Protein, ur: NEGATIVE mg/dL
Specific Gravity, Urine: 1.017 (ref 1.005–1.030)
pH: 7 (ref 5.0–8.0)

## 2022-09-06 LAB — COMPREHENSIVE METABOLIC PANEL
ALT: 10 U/L (ref 0–44)
AST: 14 U/L — ABNORMAL LOW (ref 15–41)
Albumin: 3.2 g/dL — ABNORMAL LOW (ref 3.5–5.0)
Alkaline Phosphatase: 34 U/L — ABNORMAL LOW (ref 38–126)
Anion gap: 7 (ref 5–15)
BUN: 6 mg/dL (ref 6–20)
CO2: 23 mmol/L (ref 22–32)
Calcium: 8.6 mg/dL — ABNORMAL LOW (ref 8.9–10.3)
Chloride: 104 mmol/L (ref 98–111)
Creatinine, Ser: 0.57 mg/dL (ref 0.44–1.00)
GFR, Estimated: >60 mL/min (ref >60)
Glucose, Bld: 83 mg/dL (ref 70–99)
Potassium: 3.9 mmol/L (ref 3.5–5.1)
Sodium: 134 mmol/L — ABNORMAL LOW (ref 135–145)
Total Bilirubin: 0.6 mg/dL (ref 0.3–1.2)
Total Protein: 6.3 g/dL — ABNORMAL LOW (ref 6.5–8.1)

## 2022-09-06 LAB — CBC
HCT: 34.2 % — ABNORMAL LOW (ref 36.0–46.0)
Hemoglobin: 12 g/dL (ref 12.0–15.0)
MCH: 29.5 pg (ref 26.0–34.0)
MCHC: 35.1 g/dL (ref 30.0–36.0)
MCV: 84 fL (ref 80.0–100.0)
Platelets: 330 10*3/uL (ref 150–400)
RBC: 4.07 MIL/uL (ref 3.87–5.11)
RDW: 17 % — ABNORMAL HIGH (ref 11.5–15.5)
WBC: 5 10*3/uL (ref 4.0–10.5)
nRBC: 0 % (ref 0.0–0.2)

## 2022-09-06 LAB — TROPONIN I (HIGH SENSITIVITY): Troponin I (High Sensitivity): 3 ng/L (ref <18)

## 2022-09-06 LAB — D-DIMER, QUANTITATIVE: D-Dimer, Quant: 0.84 ug/mL-FEU — ABNORMAL HIGH (ref 0.00–0.50)

## 2022-09-06 MED ORDER — ALBUTEROL SULFATE HFA 108 (90 BASE) MCG/ACT IN AERS: 2.0000 | INHALATION_SPRAY | Freq: Four times a day (QID) | RESPIRATORY_TRACT | 2 refills | Status: AC | PRN

## 2022-09-06 MED ORDER — ACETAMINOPHEN 500 MG PO TABS
1000.0000 mg | ORAL_TABLET | Freq: Once | ORAL | Status: AC
  Administered 2022-09-06: 1000 mg via ORAL
  Filled 2022-09-06: qty 2

## 2022-09-06 MED ORDER — ALBUTEROL SULFATE HFA 108 (90 BASE) MCG/ACT IN AERS
2.0000 | INHALATION_SPRAY | Freq: Once | RESPIRATORY_TRACT | Status: AC
  Administered 2022-09-06: 2 via RESPIRATORY_TRACT
  Filled 2022-09-06: qty 6.7

## 2022-09-06 NOTE — MAU Provider Note (Addendum)
History     CSN: [id number]  Arrival date and time: 09/06/22 1248   None     Chief Complaint  Patient presents with   Abdominal Pain   Chest Pain   Patient reports malaise and fatigue starting over a week ago. She was diagnosed with COVID on 10/8. She was prescribed Paxlovid however her pharmacy told her that it was out of stock, so she never received the medication. Since this point she has been feeling pressure in her chest and difficulty breathing. She says she mainly has difficulty at the end of her breath in. She has pressure and pain starting in her midchest along her sternum which radiates across the right side along her ribs. She denies diaphoresis.   She also mentions that she has history of subjective palpitations. This occurs occasionally when she is anxious but also when she does not feel anxious. Sometimes she wakes up with palpitations.   She denies unilateral swelling in her legs, acute start of chest pain/pressure. She does not currently feel palpitations.     OB History     Gravida  4   Para  2   Term  2   Preterm  0   AB  1   Living  2      SAB  1   IAB  0   Ectopic  0   Multiple  0   Live Births  2           Past Medical History:  Diagnosis Date   Anemia    Bronchitis    Depression    History of postpartum hypertension 05/24/2018   Irregular menstrual bleeding    Trichomonosis     Past Surgical History:  Procedure Laterality Date   WISDOM TOOTH EXTRACTION      Family History  Problem Relation Age of Onset   Asthma Mother     Social History   Tobacco Use   Smoking status: Never   Smokeless tobacco: Never  Vaping Use   Vaping Use: Never used  Substance Use Topics   Alcohol use: No   Drug use: No    Allergies:  Allergies  Allergen Reactions   Tomato Hives    Medications Prior to Admission  Medication Sig Dispense Refill Last Dose   cefadroxil (DURICEF) 500 MG capsule Take 1 capsule (500 mg total) by mouth 2  (two) times daily. 14 capsule 0 09/05/2022   Prenatal Vit-Fe Fumarate-FA (PRENATAL MULTIVITAMIN) TABS tablet Take 1 tablet by mouth daily at 12 noon. 90 tablet 3 09/05/2022   doxycycline (VIBRAMYCIN) 100 MG capsule Take 1 capsule (100 mg total) by mouth 2 (two) times daily. 20 capsule 0    doxylamine, Sleep, (UNISOM) 25 MG tablet Take 1 tablet (25 mg total) by mouth at bedtime as needed (Nausea in pregnancy). 30 tablet 0 09/04/2022   metroNIDAZOLE (FLAGYL) 500 MG tablet Take 1 tablet (500 mg total) by mouth 2 (two) times daily. Take 1 tablet by mouth twice daily for 7 days, double dosage given for expedited partner therapy 28 tablet 0 09/04/2022   Prenatal Vit-Fe Fumarate-FA (PRENATAL ONE DAILY) 27-0.8 MG TABS Take 1 tablet by mouth daily. 30 tablet 0     Review of Systems as per HPI  Physical Exam   Blood pressure 132/64, pulse 100, temperature (!) 97.5 F (36.4 C), temperature source Oral, resp. rate 18, weight 59.6 kg, last menstrual period 04/25/2022, SpO2 100 %, unknown if currently breastfeeding.  Physical Exam Constitutional:  General: She is not in acute distress. HENT:     Mouth/Throat:     Mouth: Mucous membranes are moist.  Cardiovascular:     Rate and Rhythm: Normal rate and regular rhythm.     Heart sounds: Normal heart sounds. No murmur heard.    No friction rub. No gallop.  Pulmonary:     Effort: Pulmonary effort is normal.     Comments: Diminished breath sounds throughout, taking shallow breaths, but no tachypnea Skin:    Capillary Refill: Capillary refill takes less than 2 seconds.  Neurological:     Mental Status: She is alert.  Psychiatric:        Mood and Affect: Mood normal.        Behavior: Behavior normal.     MAU Course   EKG NSR  UA, CBC wnl  D dimer 0.84 Troponin 3  Treated with tylenol 1 g and albuterol HFA Assessment and Plan  Chest pain  Rule out ACS as EKG is normal and troponin wnl. Patient not diaphoretic and subacute process. PE less  likely as lung exam diminished throughout, no unilateral swelling indicative of DVT, not tachy, or hypoxic. Most likely costochondritis as location and characteristic of pain being at end of inspiration are more in line with costochondritis secondary to COVID. D dimer of 0.84 is within normal range as adjusted for gestation age. Normal range for second trimester is 0.32 - 1.29. Troponin is wnl.  - Discharge with return precautions  - Tylenol as needed, albuterol as needed  - F/u with PCP   Lowry Ram 09/06/2022, 2:01 PM

## 2022-09-06 NOTE — MAU Note (Signed)
Michelle Greer is a 26 y.o. at 8w1dhere in MAU reporting: chest pain, lower abdominal pain, and N/V.  Reports chest pain is constant and is located in the center, feels like something heavy is sitting on it and it hard to breathe.  States recent Covid + Dx on 08/29/2022.  Also reports sharp lower abdominal pain that's stabbing.   Also reports N/V, reports can't keep anything down. States has been to 2 other hospitals prior today.  Left one prior to being seen. Onset of complaint: couple days to 2 weeks Pain score: 8 & 10 Vitals:   09/06/22 1307  BP: 132/64  Pulse: 100  Resp: 18  Temp: (!) 97.5 F (36.4 C)  SpO2: 100%     FHT:155 bpm Lab orders placed from triage:   UA

## 2022-10-20 ENCOUNTER — Telehealth: Payer: Medicaid Other

## 2022-10-26 ENCOUNTER — Encounter: Payer: Self-pay | Admitting: Advanced Practice Midwife

## 2022-10-26 ENCOUNTER — Inpatient Hospital Stay (HOSPITAL_COMMUNITY)
Admission: AD | Admit: 2022-10-26 | Discharge: 2022-10-26 | Disposition: A | Discharge status: Home / Self Care | Payer: Medicaid Other | Attending: Obstetrics & Gynecology | Admitting: Obstetrics & Gynecology

## 2022-10-26 ENCOUNTER — Encounter (HOSPITAL_COMMUNITY): Payer: Self-pay | Admitting: Obstetrics & Gynecology

## 2022-10-26 ENCOUNTER — Other Ambulatory Visit: Payer: Self-pay

## 2022-10-26 DIAGNOSIS — O26892 Other specified pregnancy related conditions, second trimester: Secondary | ICD-10-CM | POA: Diagnosis not present

## 2022-10-26 DIAGNOSIS — Z1152 Encounter for screening for COVID-19: Secondary | ICD-10-CM | POA: Insufficient documentation

## 2022-10-26 DIAGNOSIS — O0932 Supervision of pregnancy with insufficient antenatal care, second trimester: Secondary | ICD-10-CM | POA: Diagnosis not present

## 2022-10-26 DIAGNOSIS — R112 Nausea with vomiting, unspecified: Secondary | ICD-10-CM | POA: Insufficient documentation

## 2022-10-26 DIAGNOSIS — M545 Low back pain, unspecified: Secondary | ICD-10-CM | POA: Insufficient documentation

## 2022-10-26 DIAGNOSIS — R109 Unspecified abdominal pain: Secondary | ICD-10-CM | POA: Diagnosis not present

## 2022-10-26 DIAGNOSIS — O212 Late vomiting of pregnancy: Secondary | ICD-10-CM | POA: Diagnosis not present

## 2022-10-26 DIAGNOSIS — O09292 Supervision of pregnancy with other poor reproductive or obstetric history, second trimester: Secondary | ICD-10-CM | POA: Diagnosis not present

## 2022-10-26 DIAGNOSIS — R519 Headache, unspecified: Secondary | ICD-10-CM | POA: Diagnosis not present

## 2022-10-26 DIAGNOSIS — O219 Vomiting of pregnancy, unspecified: Secondary | ICD-10-CM

## 2022-10-26 DIAGNOSIS — O26899 Other specified pregnancy related conditions, unspecified trimester: Secondary | ICD-10-CM | POA: Insufficient documentation

## 2022-10-26 DIAGNOSIS — Z3A22 22 weeks gestation of pregnancy: Secondary | ICD-10-CM | POA: Diagnosis not present

## 2022-10-26 LAB — URINALYSIS, ROUTINE W REFLEX MICROSCOPIC
Bilirubin Urine: NEGATIVE
Glucose, UA: NEGATIVE mg/dL
Hgb urine dipstick: NEGATIVE
Ketones, ur: NEGATIVE mg/dL
Leukocytes,Ua: NEGATIVE
Nitrite: NEGATIVE
Protein, ur: NEGATIVE mg/dL
Specific Gravity, Urine: 1.013 (ref 1.005–1.030)
pH: 7 (ref 5.0–8.0)

## 2022-10-26 LAB — WET PREP, GENITAL
Clue Cells Wet Prep HPF POC: NONE SEEN
Sperm: NONE SEEN
Trich, Wet Prep: NONE SEEN
WBC, Wet Prep HPF POC: <10 (ref <10)
Yeast Wet Prep HPF POC: NONE SEEN

## 2022-10-26 LAB — RESP PANEL BY RT-PCR (FLU A&B, COVID) ARPGX2
Influenza A by PCR: NEGATIVE
Influenza B by PCR: NEGATIVE
SARS Coronavirus 2 by RT PCR: NEGATIVE

## 2022-10-26 MED ORDER — PROMETHAZINE HCL 25 MG PO TABS
25.0000 mg | ORAL_TABLET | Freq: Four times a day (QID) | ORAL | Status: DC | PRN
  Administered 2022-10-26: 25 mg via ORAL
  Filled 2022-10-26: qty 1

## 2022-10-26 MED ORDER — PROMETHAZINE HCL 25 MG PO TABS: 25.0000 mg | ORAL_TABLET | Freq: Four times a day (QID) | ORAL | 2 refills | Status: DC | PRN

## 2022-10-26 NOTE — MAU Provider Note (Addendum)
History    Chief Complaint  Patient presents with   Emesis   Nausea   Abdominal Pain   Back Pain   Chills   Headache   Shortness of Breath   26yo G4P2 at 74w2dpresents with abdominal pain, N/V, back pain with history of pre-E in previous pregnancy, ovarian cyst, brochitis. Has not had prenatal care - has some upcoming visits with [facility].   Emesis  This is a chronic problem. The current episode started more than 1 month ago. The problem has been rapidly worsening. There has been no fever. Associated symptoms include abdominal pain, chills, diarrhea, headaches and sweats. She has tried nothing for the symptoms.  Abdominal Pain This is a chronic problem. The current episode started more than 1 month ago. The onset quality is gradual. The problem has been gradually worsening. The pain is located in the suprapubic region, LUQ and RUQ. The pain is moderate. The quality of the pain is cramping and sharp. The abdominal pain radiates to the back. Associated symptoms include diarrhea, headaches, nausea and vomiting. Pertinent negatives include no dysuria. Treatments tried: unisom. The treatment provided mild relief.  Back Pain The current episode started more than 1 month ago. The problem occurs daily. The problem has been gradually worsening since onset. The pain is present in the lumbar spine. The pain is at a severity of 8/10. The pain is mild. Stiffness is present At night. Associated symptoms include abdominal pain and headaches. Pertinent negatives include no dysuria. (Some leakage of urine with sneezing) She has tried nothing for the symptoms.  Headache  This is a chronic problem. The pain quality is similar to prior headaches. The pain is at a severity of 6/10. Associated symptoms include abdominal pain, back pain, blurred vision, nausea and vomiting.    OB History     Gravida  4   Para  2   Term  2   Preterm  0   AB  1   Living  2      SAB  1   IAB  0   Ectopic  0    Multiple  0   Live Births  2           Past Medical History:  Diagnosis Date   Anemia    Bronchitis    Depression    History of postpartum hypertension 05/24/2018   Irregular menstrual bleeding    Trichomonosis     Past Surgical History:  Procedure Laterality Date   WISDOM TOOTH EXTRACTION      Family History  Problem Relation Age of Onset   Asthma Mother     Social History   Tobacco Use   Smoking status: Never   Smokeless tobacco: Never  Vaping Use   Vaping Use: Never used  Substance Use Topics   Alcohol use: No   Drug use: No    Allergies:  Allergies  Allergen Reactions   Tomato Hives    Medications Prior to Admission  Medication Sig Dispense Refill Last Dose   Prenatal Vit-Fe Fumarate-FA (PRENATAL ONE DAILY) 27-0.8 MG TABS Take 1 tablet by mouth daily. 30 tablet 0 10/25/2022   albuterol (VENTOLIN HFA) 108 (90 Base) MCG/ACT inhaler Inhale 2 puffs into the lungs every 6 (six) hours as needed for wheezing or shortness of breath. 8 g 2    cefadroxil (DURICEF) 500 MG capsule Take 1 capsule (500 mg total) by mouth 2 (two) times daily. 14 capsule 0    doxycycline (VIBRAMYCIN)  100 MG capsule Take 1 capsule (100 mg total) by mouth 2 (two) times daily. 20 capsule 0    doxylamine, Sleep, (UNISOM) 25 MG tablet Take 1 tablet (25 mg total) by mouth at bedtime as needed (Nausea in pregnancy). 30 tablet 0    metroNIDAZOLE (FLAGYL) 500 MG tablet Take 1 tablet (500 mg total) by mouth 2 (two) times daily. Take 1 tablet by mouth twice daily for 7 days, double dosage given for expedited partner therapy 28 tablet 0    Prenatal Vit-Fe Fumarate-FA (PRENATAL MULTIVITAMIN) TABS tablet Take 1 tablet by mouth daily at 12 noon. 90 tablet 3     Review of Systems  Constitutional:  Positive for chills.  Eyes:  Positive for blurred vision.  Gastrointestinal:  Positive for abdominal pain, diarrhea, nausea and vomiting.  Genitourinary:  Negative for dysuria.  Musculoskeletal:  Positive  for back pain.  Neurological:  Positive for headaches.   Physical Exam Blood pressure 116/75, pulse (!) 103, temperature 98.7 F (37.1 C), temperature source Oral, resp. rate 14, last menstrual period 04/25/2022, SpO2 100 %, unknown if currently breastfeeding. Physical Exam Constitutional:      General: She is not in acute distress.    Appearance: She is not ill-appearing.  HENT:     Head: Normocephalic and atraumatic.  Pulmonary:     Effort: Pulmonary effort is normal.  Abdominal:     Palpations: Abdomen is soft.     Tenderness: There is abdominal tenderness in the suprapubic area. There is no right CVA tenderness, left CVA tenderness, guarding or rebound.     Comments: Gravid. Mildly TTP of bilateral sides.   Musculoskeletal:     Right lower leg: No tenderness. No edema.     Left lower leg: No tenderness. No edema.  Skin:    General: Skin is warm and dry.  Neurological:     Mental Status: She is alert.     Toco: no contractions.  Results for orders placed or performed during the hospital encounter of 10/26/22 (from the past 24 hour(s))  Wet prep, genital     Status: None   Collection Time: 10/26/22 12:17 PM  Result Value Ref Range   Yeast Wet Prep HPF POC NONE SEEN NONE SEEN   Trich, Wet Prep NONE SEEN NONE SEEN   Clue Cells Wet Prep HPF POC NONE SEEN NONE SEEN   WBC, Wet Prep HPF POC <10 <10   Sperm NONE SEEN   Resp Panel by RT-PCR (Flu A&B, Covid)     Status: None   Collection Time: 10/26/22 12:18 PM   Specimen: Nasal Swab  Result Value Ref Range   SARS Coronavirus 2 by RT PCR NEGATIVE NEGATIVE   Influenza A by PCR NEGATIVE NEGATIVE   Influenza B by PCR NEGATIVE NEGATIVE  Urinalysis, Routine w reflex microscopic Urine, Clean Catch     Status: Abnormal   Collection Time: 10/26/22 12:30 PM  Result Value Ref Range   Color, Urine YELLOW YELLOW   APPearance HAZY (A) CLEAR   Specific Gravity, Urine 1.013 1.005 - 1.030   pH 7.0 5.0 - 8.0   Glucose, UA NEGATIVE  NEGATIVE mg/dL   Hgb urine dipstick NEGATIVE NEGATIVE   Bilirubin Urine NEGATIVE NEGATIVE   Ketones, ur NEGATIVE NEGATIVE mg/dL   Protein, ur NEGATIVE NEGATIVE mg/dL   Nitrite NEGATIVE NEGATIVE   Leukocytes,Ua NEGATIVE NEGATIVE    MAU Course Procedures  MDM  Abdominal pain: Ddx: round ligament pain, UTI, cervicitis. Onset of abdominal pain has  been greater than one month with worsening over last 6-7 days. Afraid to take any medications that may hurt her baby, so she has not tried any medications. Also in setting of chills and sweats - may be related to other infection like URI, gastroenteritis. Here is afebrile and saturating at 100%. No contractions on monitor here. Has been hydrating well - drinking about 5-6 16oz water bottles daily. - U/A negative and well-hydrated - Wet prep negative - GC/chlamydia - Tylenol  N/V: Ddx: viral gastroenteritis with recent worsening and 3x diarrhea, COVID/Flu with some congestion and SOB. Patient reports N/V has been present for majority of pregnancy and was briefly responsive to unisom, but she is out of that medication now.  - Phenergen given - COVID/Flu PCR negative  Assessment/Plan: 1. Abdominal pain during pregnancy in second trimester   2. [redacted] weeks gestation of pregnancy   3. Nausea and vomiting during pregnancy    - U/A negative, wet prep negative. GC/chlamydia still pending. COVID/flu PCR negative. Patient's nausea and vomiting resolved with phenergen. Abdominal pain spontaneously improved during stay. Discharged with phenergen for home. - Counseled patient on tylenol use for abdominal pain.    Youlanda Roys, MD 10/26/2022 11:36 AM  Attestation of Supervision of Student:  I confirm that I have verified the information documented in the  resident 's note and that I have also personally reperformed the history, physical exam and all medical decision making activities.  I have verified that all services and findings are accurately documented in  this student's note; and I agree with management and plan as outlined in the documentation. I have also made any necessary editorial changes.   Laury Deep, [place] for [employer], [place] [facility] 10/26/2022 2:25 PM

## 2022-10-26 NOTE — MAU Note (Signed)
.  Sharonne Ricketts is a 26 y.o. at 31w2dhere in MAU reporting: has been feeling bad for the last few weeks. Has been having nausea and vomiting, chills, and shortness of breath for a few weeks (was prescribed an inhaler but unsure where it is located). Also reports a slight headache today. Had an increase in nausea and vomiting in the last 24 hours for a total of 6 times and x3 episodes of diarrhea. Also feels like her stomach is tight and hurts constantly in her lower abdomen and back. Denies VB or LOF. +FM. Also states she may need to be tested again for trichomonas and reporting burning and frequency when she voids.  Pain score: headache- 6, abdomen- 10, back- 8 Vitals:   10/26/22 1135  BP: 116/75  Pulse: (!) 103  Resp: 14  SpO2: 100%     FHT:156 Lab orders placed from triage:  UA

## 2022-10-27 LAB — GC/CHLAMYDIA PROBE AMP (~~LOC~~) NOT AT ARMC
Chlamydia: NEGATIVE
Comment: NEGATIVE
Comment: NORMAL
Neisseria Gonorrhea: NEGATIVE

## 2022-11-17 ENCOUNTER — Telehealth (INDEPENDENT_AMBULATORY_CARE_PROVIDER_SITE_OTHER): Payer: Medicaid Other | Admitting: *Deleted

## 2022-11-17 ENCOUNTER — Encounter: Payer: Self-pay | Admitting: *Deleted

## 2022-11-17 DIAGNOSIS — O0990 Supervision of high risk pregnancy, unspecified, unspecified trimester: Secondary | ICD-10-CM

## 2022-11-17 DIAGNOSIS — Z8759 Personal history of other complications of pregnancy, childbirth and the puerperium: Secondary | ICD-10-CM

## 2022-11-17 DIAGNOSIS — O99340 Other mental disorders complicating pregnancy, unspecified trimester: Secondary | ICD-10-CM

## 2022-11-17 DIAGNOSIS — Z3689 Encounter for other specified antenatal screening: Secondary | ICD-10-CM

## 2022-11-17 MED ORDER — BLOOD PRESSURE KIT DEVI: 1.0000 | 0 refills | Status: DC | PRN

## 2022-11-17 MED ORDER — GOJJI WEIGHT SCALE MISC: 1.0000 | Freq: Once | 0 refills | Status: AC

## 2022-11-17 NOTE — Progress Notes (Signed)
New OB Intake  I connected with Michelle Greer on 11/17/22 at  3:15 PM EST by MyChart Video Visit and verified that I am speaking with the correct person using two identifiers. Nurse is located at [facility] and pt is located at in car as passenger.  I discussed the limitations, risks, security and privacy concerns of performing an evaluation and management service by telephone and the availability of in person appointments. I also discussed with the patient that there may be a patient responsible charge related to this service. The patient expressed understanding and agreed to proceed.  I explained I am completing New OB Intake today. We discussed EDD of 02/27/23 that is based on [place]. Pt is G4/P3002. I reviewed her allergies, medications, Medical/Surgical/OB history, and appropriate screenings. She has elevated PHQ9 . I offered her referral to [facility] which she accepted. She did answer yes to thoughts of better off dead but denies plan to end her life. I scheduled appointment with [facility] and also contacted Roselyn Reef [facility] who will call patient prior to her appointment. I also informed her of [facility] open 24/7. informed her of [facility] services. [facility] information placed in AVS. Based on history, this is a high risk pregnancy.  Patient Active Problem List   Diagnosis Date Noted   Supervision of high risk pregnancy, antepartum 11/17/2022   Nausea & vomiting 10/26/2022   Abdominal pain during intrauterine pregnancy 10/26/2022   History of postpartum hypertension 05/24/2018    Concerns addressed today  Delivery Plans Plans to deliver at [facility]. Patient given information for [facility] Healthy Baby website for more information about Women[facility] and [place]. Patient is interested in water birth. Offered upcoming OB visit with CNM to discuss further.  MyChart/Babyscripts MyChart access verified. I explained pt will have some visits in office and some virtually. Babyscripts instructions given and order placed.   Blood  Pressure Cuff/Weight Scale Blood pressure cuff ordered for patient to pick-up from [employer]. Explained after first prenatal appt pt will check weekly and document in 13. Patient does not have weight scale; order sent to [place], patient may track weight weekly in Babyscripts.  Anatomy US Explained first scheduled [place] will be around 19 weeks. Anatomy US scheduled for 12/09/22 at [place]. Pt notified to arrive at 0730.  Labs Discussed Johnsie Cancel genetic screening with patient. Would like both Panorama and Horizon drawn at new OB visit. Routine prenatal labs needed.  COVID Vaccine Patient has not had COVID vaccine.   Is patient a CenteringPregnancy candidate?  Patient interested in CenteringPregnancy but due to her EDD may not be able to be enrolled since group has already started. I explained I will check with provider and get back to her.  Is patient a Mom+Baby Combined Care candidate?  Not a candidate    Social Determinants of Health Food Insecurity: Patient denies food insecurity. WIC Referral: Patient is interested in referral to [facility].  Transportation: Patient denies transportation needs. Childcare: Discussed no children allowed at ultrasound appointments. Offered childcare services; patient declines childcare services at this time.  First visit review I reviewed new OB appt with patient. I explained they will have a provider visit that includes exam and blood work. Explained pt will be seen by Dr. Trina Ao  at first visit; encounter routed to appropriate provider. Explained that patient will be seen by pregnancy navigator following visit with provider.   Staci Acosta 11/17/2022  4:20 PM

## 2022-11-18 NOTE — BH Specialist Note (Signed)
  Pt did not arrive to video visit and did not answer the phone; unable to leave message as mailbox is full; left MyChart message for patient.

## 2022-11-19 ENCOUNTER — Telehealth: Payer: Self-pay | Admitting: Clinical

## 2022-11-19 NOTE — Telephone Encounter (Signed)
Brief introduction and reminder about 8:15am in-person meeting on 11/25/2021.

## 2022-11-22 NOTE — L&D Delivery Note (Cosign Needed Addendum)
LABOR COURSE Patient admitted for IOL due to severe FGR with elevated UAD, oligohydramnios, cardiomegaly. IOL begun after administration of 2 doses BMZ. Induction methods include pitocin, foley bulb and AROM.   Delivery Note Called to room and patient was complete and pushing. Head delivered ROA across intact perineum. No nuchal cord present. Anterior shoulder grasped and body delivered in usual fashion. At 52 a viable female infant was delivered via Vaginal, Spontaneous (Presentation: ROA, R compound hand).  Infant with spontaneous cry, placed on mother's abdomen, dried and stimulated. Cord clamped x 2 after 1-minute delay, and cut by FOB. Cord blood drawn. Due to brisk bleeding, methergine and TXA given. Fundus was initially bogged but became firm following fundal massage. Placenta delivered spontaneously with gentle cord traction. Appears intact. Fundus firm with massage and Pitocin. Labia, perineum, vagina, and cervix inspected with superficial peri-clitoral laceration.    APGAR: 9, ;9 weight 3lb 6.3oz.   Cord: 3VC with the following complications: none.    Anesthesia:  Epidural Episiotomy: None Lacerations: superficial peri-clitoral laceration Suture Repair:  NA Est. Blood Loss (mL): 522cc  Mom to postpartum.  Baby to NICU.  Darlen Round, MD PGY-1 01/21/2023 12:41 PM      Fellow ATTESTATION  I was present and gloved for this delivery and agree with the above documentation in the resident's note.  Chiagoziem Sherrilyn Rist, MD/[facility] for [employer] (Faculty Practice) 01/22/2023, 7:29 AM

## 2022-11-25 ENCOUNTER — Ambulatory Visit: Payer: No Typology Code available for payment source | Admitting: Clinical

## 2022-11-25 ENCOUNTER — Telehealth: Payer: Self-pay | Admitting: Clinical

## 2022-11-25 DIAGNOSIS — Z91199 Patient's noncompliance with other medical treatment and regimen due to unspecified reason: Secondary | ICD-10-CM

## 2022-11-25 NOTE — Telephone Encounter (Signed)
Call to patient to ask if she'd like to switch today's in-person visit to virtual, as she had not arrived by 8:20am for 8:15am appointment, and pt agreed to video visit. Video invite was sent to pt. Pt did not arrive to video visit and did not answer the phone after three attempts; Unable to leave voice message as mailbox is full; left MyChart message for patient.

## 2022-11-29 ENCOUNTER — Other Ambulatory Visit (HOSPITAL_COMMUNITY)
Admission: RE | Admit: 2022-11-29 | Discharge: 2022-11-29 | Disposition: A | Discharge status: Home / Self Care | Payer: Medicaid Other | Source: Ambulatory Visit | Attending: Advanced Practice Midwife | Admitting: Advanced Practice Midwife

## 2022-11-29 ENCOUNTER — Ambulatory Visit (INDEPENDENT_AMBULATORY_CARE_PROVIDER_SITE_OTHER): Payer: Medicaid Other | Admitting: Family Medicine

## 2022-11-29 ENCOUNTER — Encounter: Payer: Self-pay | Admitting: Family Medicine

## 2022-11-29 VITALS — BP 119/71 | HR 100 | Wt 159.7 lb

## 2022-11-29 DIAGNOSIS — Z124 Encounter for screening for malignant neoplasm of cervix: Secondary | ICD-10-CM

## 2022-11-29 DIAGNOSIS — Z3A27 27 weeks gestation of pregnancy: Secondary | ICD-10-CM

## 2022-11-29 DIAGNOSIS — O0990 Supervision of high risk pregnancy, unspecified, unspecified trimester: Secondary | ICD-10-CM

## 2022-11-29 DIAGNOSIS — F321 Major depressive disorder, single episode, moderate: Secondary | ICD-10-CM

## 2022-11-29 DIAGNOSIS — Z8679 Personal history of other diseases of the circulatory system: Secondary | ICD-10-CM | POA: Diagnosis not present

## 2022-11-29 DIAGNOSIS — Z8759 Personal history of other complications of pregnancy, childbirth and the puerperium: Secondary | ICD-10-CM

## 2022-11-29 DIAGNOSIS — F418 Other specified anxiety disorders: Secondary | ICD-10-CM

## 2022-11-29 LAB — HEPATITIS C ANTIBODY: HCV Ab: NEGATIVE

## 2022-11-29 LAB — OB RESULTS CONSOLE HEPATITIS B SURFACE ANTIGEN: Hepatitis B Surface Ag: NEGATIVE

## 2022-11-29 LAB — OB RESULTS CONSOLE HIV ANTIBODY (ROUTINE TESTING): HIV: NONREACTIVE

## 2022-11-29 LAB — OB RESULTS CONSOLE RUBELLA ANTIBODY, IGM: Rubella: IMMUNE

## 2022-11-29 LAB — OB RESULTS CONSOLE GC/CHLAMYDIA
Chlamydia: NEGATIVE
Neisseria Gonorrhea: NEGATIVE

## 2022-11-29 LAB — OB RESULTS CONSOLE RPR: RPR: NONREACTIVE

## 2022-11-29 MED ORDER — SERTRALINE HCL 25 MG PO TABS: ORAL_TABLET | ORAL | 0 refills | Status: DC

## 2022-11-29 MED ORDER — ASPIRIN 81 MG PO TBEC: 81.0000 mg | DELAYED_RELEASE_TABLET | Freq: Every day | ORAL | 3 refills | Status: DC

## 2022-11-29 NOTE — Patient Instructions (Addendum)
[facility], [place], [place] [address] Phone: [phone]  Safe Medications in Pregnancy   Acne:  Benzoyl Peroxide  Salicylic Acid   Backache/Headache:  Tylenol: 2 regular strength every 4 hours OR               2 Extra strength every 6 hours   Colds/Coughs/Allergies:  Benadryl (alcohol free) 25 mg every 6 hours as needed  Breath right strips  Claritin  Cepacol throat lozenges  Chloraseptic throat spray  Cold-Eeze- up to three times per day  Cough drops, alcohol free  Flonase (by prescription only)  Guaifenesin  Mucinex  Robitussin DM (plain only, alcohol free)  Saline nasal spray/drops  Sudafed (pseudoephedrine) & Actifed * use only after [redacted] weeks gestation and if you do not have high blood pressure  Tylenol  Vicks Vaporub  Zinc lozenges  Zyrtec   Constipation:  Colace  Ducolax suppositories  Fleet enema  Glycerin suppositories  Metamucil  Milk of magnesia  Miralax  Senokot  Smooth move tea   Diarrhea:  Kaopectate  Imodium A-D   *NO pepto Bismol   Hemorrhoids:  Anusol  Anusol HC  Preparation H  Tucks   Indigestion:  Tums  Maalox  Mylanta  Zantac  Pepcid   Insomnia:  Benadryl (alcohol free) '25mg'$  every 6 hours as needed  Tylenol PM  Unisom, no Gelcaps   Leg Cramps:  Tums  MagGel   Nausea/Vomiting:  Bonine  Dramamine  Emetrol  Ginger extract  Sea bands  Meclizine  Nausea medication to take during pregnancy:  Unisom (doxylamine succinate 25 mg tablets) Take one tablet daily at bedtime. If symptoms are not adequately controlled, the dose can be increased to a maximum recommended dose of two tablets daily (1/2 tablet in the morning, 1/2 tablet mid-afternoon and one at bedtime).  Vitamin B6 '100mg'$  tablets. Take one tablet twice a day (up to 200 mg per day).   Skin Rashes:  Aveeno products  Benadryl cream or '25mg'$  every 6 hours as needed  Calamine Lotion  1% cortisone cream   Yeast infection:  Gyne-lotrimin 7   Monistat 7    **If taking multiple medications, please check labels to avoid duplicating the same active ingredients  **take medication as directed on the label  ** Do not exceed 4000 mg of tylenol in 24 hours  **Do not take medications that contain aspirin or ibuprofen

## 2022-11-29 NOTE — Progress Notes (Unsigned)
History:   Michelle Greer is a 27 y.o. [id number] at 25w1dby 14week UKoreabeing seen today for her first obstetrical visit.  Her obstetrical history is significant for postpartum hypertension vs pre-eclampsia. Patient does intend to breast feed. Pregnancy history fully reviewed.  Patient reports no complaints.   Noted to have a high score on PHQ-9, GAD-7. States that she has struggled with depression and anxiety for many years. Hasn't been on medications in the past.  No worsening depression in post partum period with previous pregnancies.    HISTORY: OB History  Gravida Para Term Preterm AB Living  '4 2 2 '$ 0 1 2  SAB IAB Ectopic Multiple Live Births  1 0 0 0 2    # Outcome Date GA Lbr Len/2nd Weight Sex Delivery Anes PTL Lv  4 Current           3 SAB 08/02/21 958w0d       2 Term 12/21/18 3921w3d lb 9 oz (2.977 kg) M Vag-Spont EPI  LIV  1 Term 07/16/13 [id number] / 00:53 6 lb 6.4 oz (2.903 kg) M Vag-Spont EPI  LIV     Birth Comments: High blood pressure/ postpartum hypertension vs preeclampsia     Name: Crill,BOY Azusena     Apgar1: 9  Apgar5: 9    Last pap smear was done in 2019 and was normal. Repeat done today.  Past Medical History:  Diagnosis Date   Anemia    Bronchitis    Complication of anesthesia    Depression    History of postpartum hypertension 05/24/2018   Irregular menstrual bleeding    Trichomonosis    Past Surgical History:  Procedure Laterality Date   WISDOM TOOTH EXTRACTION     Family History  Problem Relation Age of Onset   Asthma Mother    Social History   Tobacco Use   Smoking status: Never   Smokeless tobacco: Never  Vaping Use   Vaping Use: Never used  Substance Use Topics   Alcohol use: No   Drug use: No   Allergies  Allergen Reactions   Tomato Hives   Current Outpatient Medications on File Prior to Visit  Medication Sig Dispense Refill   albuterol (VENTOLIN HFA) 108 (90 Base) MCG/ACT inhaler Inhale 2 puffs into the lungs every 6 (six)  hours as needed for wheezing or shortness of breath. 8 g 2   Prenatal Vit-Fe Fumarate-FA (PRENATAL VITAMIN PO) Take 1 tablet by mouth daily.     Blood Pressure Monitoring (BLOOD PRESSURE KIT) DEVI 1 Device by Does not apply route as needed. (Patient not taking: Reported on 11/29/2022) 1 each 0   metroNIDAZOLE (FLAGYL) 500 MG tablet Take 1 tablet (500 mg total) by mouth 2 (two) times daily. Take 1 tablet by mouth twice daily for 7 days, double dosage given for expedited partner therapy 28 tablet 0   Prenatal Vit-Fe Fumarate-FA (PRENATAL MULTIVITAMIN) TABS tablet Take 1 tablet by mouth daily at 12 noon. (Patient not taking: Reported on 11/29/2022) 90 tablet 3   promethazine (PHENERGAN) 25 MG tablet Take 1 tablet (25 mg total) by mouth every 6 (six) hours as needed for nausea or vomiting. (Patient not taking: Reported on 11/29/2022) 30 tablet 2   No current facility-administered medications on file prior to visit.    Review of Systems Pertinent items noted in HPI and remainder of comprehensive ROS otherwise negative.  Indications for ASA therapy (per UpToDate) One of the following: Previous pregnancy  with preeclampsia, especially early onset and with an adverse outcome {yes/no:20286} Multifetal gestation {yes/no:20286} Chronic hypertension {yes/no:20286} Type 1 or 2 diabetes mellitus {yes/no:20286} Chronic kidney disease {yes/no:20286} Autoimmune disease (antiphospholipid syndrome, systemic lupus erythematosus) {yes/no:20286} Two or more of the following: Nulliparity {yes/no:20286} Obesity (body mass index >30 kg/m2) {yes/no:20286} Family history of preeclampsia in mother or sister {yes/no:20286} Age ?35 years {yes/no:20286} Sociodemographic characteristics (African American race, low socioeconomic level) {yes/no:20286} Personal risk factors (eg, previous pregnancy with low birth weight or small for gestational age infant, previous adverse pregnancy outcome [eg, stillbirth], interval >10 years  between pregnancies) {yes/no:20286}  Indications for early GDM screening (FBS, A1C, Random CBG, GTT) First-degree relative with diabetes {yes/no:20286} BMI >30kg/m2 {yes/no:20286} Age > 35 {yes/no:20286} Previous birth of an infant weighing ?4000 g {yes/no:20286} Gestational diabetes mellitus in a previous pregnancy {yes/no:20286} Glycated hemoglobin ?5.7 percent (39 mmol/mol), impaired glucose tolerance, or impaired fasting glucose on previous testing {yes/no:20286} High-risk race/ethnicity (eg, African American, Latino, Native American, [place] American, Pacific Islander) {yes/no:20286} Previous stillbirth of unknown cause {yes/no:20286} Maternal birthweight > 9 lbs {yes/no:20286} History of cardiovascular disease {yes/no:20286} Hypertension or on therapy for hypertension {yes/no:20286} High-density lipoprotein cholesterol level <35 mg/dL (0.90 mmol/L) and/or a triglyceride level >250 mg/dL (2.82 mmol/L) {yes/no:20286} Polycystic ovary syndrome {yes/no:20286} Physical inactivity {yes/no:20286} Other clinical condition associated with insulin resistance (eg, severe obesity, acanthosis nigricans) {yes/no:20286} Current use of glucocorticoids {yes/no:20286}   Physical Exam:   Vitals:   11/29/22 1540  BP: 119/71  Pulse: 100  Weight: 159 lb 11.2 oz (72.4 kg)   Fetal Heart Rate (bpm): 147  Uterine size:   ***Bedside Ultrasound for FHR check: Viable intrauterine pregnancy with positive cardiac activity noted, fetal heart rate ***bpm Patient informed that the ultrasound is considered a limited obstetric ultrasound and is not intended to be a complete ultrasound exam.  Patient also informed that the ultrasound is not being completed with the intent of assessing for fetal or placental anomalies or any pelvic abnormalities.  Explained that the purpose of today's ultrasound is to assess for fetal heart rate.  Patient acknowledges the purpose of the exam and the limitations of the study. General:  well-developed, well-nourished female in no acute distress  Breasts:  normal appearance, no masses or tenderness bilaterally, exam done in the presence of a chaperone.   Skin: normal coloration and turgor, no rashes  Neurologic: oriented, normal, negative, normal mood  Extremities: normal strength, tone, and muscle mass, ROM of all joints is normal  HEENT PERRLA, extraocular movement intact and sclera clear, anicteric  Neck supple and no masses  Cardiovascular: regular rate and rhythm  Respiratory:  no respiratory distress, normal breath sounds  Abdomen: soft, non-tender; bowel sounds normal; no masses,  no organomegaly  Pelvic: normal external genitalia, no lesions, normal vaginal mucosa, normal vaginal discharge, normal cervix, ***pap smear done. Exam done in the presence of a chaperone.     Assessment:    Pregnancy: [id number] Patient Active Problem List   Diagnosis Date Noted   Supervision of high risk pregnancy, antepartum 11/17/2022   Nausea & vomiting 10/26/2022   Abdominal pain during intrauterine pregnancy 10/26/2022   History of postpartum hypertension 05/24/2018     Plan:    1. Supervision of high risk pregnancy, antepartum ***  2. History of postpartum hypertension ***   Initial labs drawn. Continue prenatal vitamins. Problem list reviewed and updated. Genetic Screening discussed, {Blank multiple:19196::"Panorama","Horizon"}: {requests/ordered/declines:14581}. Ultrasound discussed; fetal anatomic survey: {Planned/scheduled/na:14534}. Anticipatory guidance about prenatal visits given including labs, ultrasounds, and  testing. Weight gain recommendations per IOM guidelines reviewed: underweight/BMI 18.5 or less > 28 - 40 lbs; normal weight/BMI 18.5 - 24.9 > 25 - 35 lbs; overweight/BMI 25 - 29.9 > 15 - 25 lbs; obese/BMI  30 or more > 11 - 20 lbs. Discussed usage of the Babyscripts app for more information about pregnancy, and to track blood pressures. Also discussed usage  of virtual visits as additional source of managing and completing prenatal visits.  Patient was encouraged to use MyChart to review results, send requests, and have questions addressed.   The nature of Center for [facility] Healthcare/Faculty Practice with multiple MDs and Advanced Practice Providers was explained to patient; also emphasized that residents, students are part of our team. Routine obstetric precautions reviewed. Encouraged to seek out care at our office or emergency room [facility] preferred) for urgent and/or emergent concerns. No follow-ups on file.     Verita Schneiders, MD, [place] for [employer], [place]

## 2022-11-30 LAB — CBC/D/PLT+RPR+RH+ABO+RUBIGG...
Antibody Screen: NEGATIVE
Basophils Absolute: 0 10*3/uL (ref 0.0–0.2)
Basos: 0 %
EOS (ABSOLUTE): 0.1 10*3/uL (ref 0.0–0.4)
Eos: 1 %
HCV Ab: NONREACTIVE
HIV Screen 4th Generation wRfx: NONREACTIVE
Hematocrit: 34.5 % (ref 34.0–46.6)
Hemoglobin: 10.9 g/dL — ABNORMAL LOW (ref 11.1–15.9)
Hepatitis B Surface Ag: NEGATIVE
Immature Grans (Abs): 0.1 10*3/uL (ref 0.0–0.1)
Immature Granulocytes: 1 %
Lymphocytes Absolute: 1.6 10*3/uL (ref 0.7–3.1)
Lymphs: 22 %
MCH: 27.9 pg (ref 26.6–33.0)
MCHC: 31.6 g/dL (ref 31.5–35.7)
MCV: 89 fL (ref 79–97)
Monocytes Absolute: 0.8 10*3/uL (ref 0.1–0.9)
Monocytes: 11 %
Neutrophils Absolute: 4.8 10*3/uL (ref 1.4–7.0)
Neutrophils: 65 %
Platelets: 387 10*3/uL (ref 150–450)
RBC: 3.9 x10E6/uL (ref 3.77–5.28)
RDW: 13.5 % (ref 11.7–15.4)
RPR Ser Ql: NONREACTIVE
Rh Factor: POSITIVE
Rubella Antibodies, IGG: 2.54 index (ref >0.99)
WBC: 7.4 10*3/uL (ref 3.4–10.8)

## 2022-11-30 LAB — HEMOGLOBIN A1C
Est. average glucose Bld gHb Est-mCnc: 105 mg/dL
Hgb A1c MFr Bld: 5.3 % (ref 4.8–5.6)

## 2022-11-30 LAB — GC/CHLAMYDIA PROBE AMP (~~LOC~~) NOT AT ARMC
Chlamydia: NEGATIVE
Comment: NEGATIVE
Comment: NORMAL
Neisseria Gonorrhea: NEGATIVE

## 2022-11-30 LAB — CYTOLOGY - PAP: Diagnosis: NEGATIVE

## 2022-11-30 LAB — HCV INTERPRETATION

## 2022-12-01 LAB — URINE CULTURE, OB REFLEX

## 2022-12-01 LAB — CULTURE, OB URINE

## 2022-12-02 ENCOUNTER — Other Ambulatory Visit: Payer: Self-pay | Admitting: Family Medicine

## 2022-12-02 ENCOUNTER — Encounter: Payer: Self-pay | Admitting: *Deleted

## 2022-12-02 DIAGNOSIS — O99013 Anemia complicating pregnancy, third trimester: Secondary | ICD-10-CM

## 2022-12-02 DIAGNOSIS — O99019 Anemia complicating pregnancy, unspecified trimester: Secondary | ICD-10-CM | POA: Insufficient documentation

## 2022-12-02 MED ORDER — FERROUS SULFATE 325 (65 FE) MG PO TBEC: 325.0000 mg | DELAYED_RELEASE_TABLET | ORAL | 2 refills | Status: AC

## 2022-12-02 NOTE — BH Specialist Note (Signed)
Integrated Behavioral Health via Telemedicine Visit  12/08/2022 Michelle Greer [id number]  Number of [place] Clinician visits: 1- Initial Visit  Session Start time: 7169   Session End time: 6789  Total time in minutes: 40   Referring Provider: Liliane Channel, MD Patient/Family location: Home [facility] Provider location: Center for [place] at [facility]  All persons participating in visit: Patient Michelle Greer and [place]   Types of Service: Individual psychotherapy and Video visit  I connected with [name] and/or [name]  n/a  via  Telephone or Video Enabled Telemedicine Application  (Video is Caregility application) and verified that I am speaking with the correct person using two identifiers. Discussed confidentiality: Yes   I discussed the limitations of telemedicine and the availability of in person appointments.  Discussed there is a possibility of technology failure and discussed alternative modes of communication if that failure occurs.  I discussed that engaging in this telemedicine visit, they consent to the provision of behavioral healthcare and the services will be billed under their insurance.  Patient and/or legal guardian expressed understanding and consented to Telemedicine visit: Yes   Presenting Concerns: Patient and/or family reports the following symptoms/concerns: Overwhelmed with increasing anxiety and insomnia; attributes symptoms to current life stress (living and working in hotel, financial stress, starting online classes soon, work/life balance). Pt has not started taking Zoloft yet due to uncertainty regarding safety, but open to implementing self-coping strategy today and learn of community resources.  Duration of problem: Ongoing with increase current pregnancy; Severity of problem:  moderately severe  Patient and/or Family's Strengths/Protective Factors: Sense of purpose  Goals  Addressed: Patient will:  Reduce symptoms of: anxiety, depression, insomnia, and stress   Increase knowledge and/or ability of: healthy habits, self-management skills, and stress reduction   Demonstrate ability to: Increase healthy adjustment to current life circumstances, Increase adequate support systems for patient/family, and Increase motivation to adhere to plan of care  Progress towards Goals: Ongoing  Interventions: Interventions utilized:  Solution-Focused Strategies, Psychoeducation and/or Health Education, and Link to [employer] Standardized Assessments completed: Not Needed  Patient and/or Family Response: Patient agrees with treatment plan.   Assessment: Patient currently experiencing Mood disorder, unspecified and Psychosocial stress.   Patient may benefit from psychoeducation and brief therapeutic interventions regarding coping with symptoms of anxiety, depression, insomnia, life stress .  Plan: Follow up with behavioral health clinician on : Two weeks Behavioral recommendations:  -Continue taking iron pill as prescribed (every other day) -Begin Worry Time strategy, as discussed. Start by setting up start and end time reminders on phone today; continue daily for two weeks. -Consider community resources on After Visit Summary; use as needed Referral(s): [place] (In Clinic) and [employer]:  Food, Finances, Housing, and Business support  I discussed the assessment and treatment plan with the patient and/or parent/guardian. They were provided an opportunity to ask questions and all were answered. They agreed with the plan and demonstrated an understanding of the instructions.   They were advised to call back or seek an in-person evaluation if the symptoms worsen or if the condition fails to improve as anticipated.  Garlan Fair, LCSW     11/29/2022    4:49 PM 11/17/2022    3:41 PM 07/20/2018    2:18 PM  Depression  screen PHQ 2/9  Decreased Interest '3 1 2  '$ Down, Depressed, Hopeless '2 1 3  '$ PHQ - 2 Score 5 2 5  Altered sleeping '3 3 2  '$ Tired, decreased energy '3 3 2  '$ Change in appetite 1 3 0  Feeling bad or failure about yourself  '3 3 3  '$ Trouble concentrating 2 1 0  Moving slowly or fidgety/restless 1 0 1  Suicidal thoughts 1 1 0  PHQ-9 Score '19 16 13  '$ Difficult doing work/chores   Somewhat difficult      11/29/2022    4:50 PM 11/17/2022    3:39 PM 07/20/2018    2:19 PM  GAD 7 : Generalized Anxiety Score  Nervous, Anxious, on Edge 2 0 2  Control/stop worrying '2 3 3  '$ Worry too much - different things '3 3 3  '$ Trouble relaxing '2 1 2  '$ Restless 2 3 0  Easily annoyed or irritable '3 3 3  '$ Afraid - awful might happen 0 0 2  Total GAD 7 Score '14 13 15  '$ Anxiety Difficulty   Somewhat difficult

## 2022-12-05 LAB — PANORAMA PRENATAL TEST FULL PANEL:PANORAMA TEST PLUS 5 ADDITIONAL MICRODELETIONS: FETAL FRACTION: 11

## 2022-12-06 LAB — HORIZON CUSTOM: REPORT SUMMARY: NEGATIVE

## 2022-12-08 ENCOUNTER — Ambulatory Visit (INDEPENDENT_AMBULATORY_CARE_PROVIDER_SITE_OTHER): Payer: Medicaid Other | Admitting: Clinical

## 2022-12-08 DIAGNOSIS — Z658 Other specified problems related to psychosocial circumstances: Secondary | ICD-10-CM | POA: Diagnosis not present

## 2022-12-08 DIAGNOSIS — F39 Unspecified mood [affective] disorder: Secondary | ICD-10-CM | POA: Diagnosis not present

## 2022-12-08 NOTE — Patient Instructions (Signed)
Center for [facility] Healthcare at [facility] for Women [place], [place] [address] [phone] (main office) [phone] [facility] office)  www.conehealthybaby.com  Armed forces training and education officer  (Childcare options, Early childcare development, etc.) PopTick.no  [place]  https://ncchildcare.ClayCleaner.co.uk  LIEAP (Low Income Futures trader) ReportZoo.com.cy  [employer] ([place]) http://cameron-bishop.biz/  SCORE: For the life of your business www.score.[place] www.womenscentergso.[place] (serves [place], [place], [place], [place], [place], [place], [place], [place], [place], [place], [place], [place], and [place] counties) [address] [place], [place] [address] [phone] http://dawson-may.com/  **Rental assistance, Home Rehabilitation,Weatherization Assistance Program, Forensic psychologist, Housing Voucher Program  [employer] for Housing and [employer] Company Studies: Chief Financial Officer Resources to residents of [place], [place], and [place] Make sure you have your documents ready, including:  (Household income verification: 2 months pay stubs, unemployment/social security award letter, statement of no income for all household members over 77) Photo ID for all household members over 18 Utility Bill/Rent Ledger/Lease: must show past due amount for utilities/rent, or the rental agreement if rent is current 2. Start your application online or by paper (in [place] or [place]) at:      http://boyd-evans.org/  3. Once you have completed the online application, you will get an email confirmation message from the county. Expect to hear back by phone or email at least 6-10 weeks from submitting your application.  4. While you wait:  Call [phone] to check in on your application Let your landlord know that you've applied. Your landlord will be asked to submit documents (W-9) during this application process. Payments will be made directly to the landlord/property [place] or utility assistance for [employer], [place], and [place] at https://rb.gy/dvxbfv Questions? Call or email Michelle Greer at [phone] or [email or web address]   Eviction Mediation Program: The HOPE Program Https://www.rebuild.http://mills-williams.net/ HOPE Progam serves low-income renters in [place] counties, defined as less than or equal to 80% of the area median income for the county where the renter lives. In the following 12 counties, you should apply to your local rent and utility assistance program INSTEAD OF the HOPE Program: [place], [place], [place], [place], [place], [place], [place], [place], [place], [place], [place]  If you live outside of [place], contact [place] call center at [phone] to talk to a Program Representative Monday-Friday, 8am-5pm Note that Native American tribes also received federal funding for rent and utility assistance programs. Recognized members of the following tribes will be served by programs managed by tribal governments, including: [place] Indians, [place], [place], [place] of [place] and [place], [place], [phone] [email or web address] .[employer], [place], [phone] [email or web address] .[place] [address] [place], [place] [address] [phone] www.gha-[place].[facility] [address] [place], [place] [address] [phone] https://manning.com/ **Programs include: Engineer, building services and Housing Counseling, Healthy Doctor, general practice, Homeless Prevention and [place] [address] [place], [place], [place] [address] [phone] www.https://www.farmer-stevens.info/ **housing applications/recertification; tax payment relief/exemption under specific qualifications  [facility] [address], [place], [place] [address] www.onlineg[place]boro.com/~maryshouse **transitional housing for women in recovery who  have minor children or are pregnant  [place], [place], [place] [address] ArtistMovie.se  **emergency shelter and support services for families facing homelessness  [place] [address] [place], [place] [address] [phone] www.youthfocus.org **transitional housing to pregnant women; emergency housing for youth who have run away, are experiencing a family crisis, are victims of abuse or neglect, or are homeless  [facility] [address], [place], [place] [address] [phone] ircgso.org **Drop-in center for people experiencing homelessness; overnight warming center when temperature is 25 degrees or below  Re-Entry Staffing [place], [place], [place] [address] [phone] https://reentrystaffingagency.org/ **help with affordable housing to people experiencing homelessness or unemployment due to incarceration  [facility] [address] [place], [place] [address] [phone] www.greensborourbanministry.org  **emergency and transitional housing, rent/mortgage assistance, utility assistance  [facility] [address] [place], [place]  [phone] www.salvationarmyofgreensboro.org **emergency and transitional housing  Habitat for [employer] [place], [place], [place] [address] [phone] Www.habitatgreensboro.[place], [place], [place] [address] [phone] https://chshousing.org **[place] and [facility]  Housing Consultants Group [address] [place] [address], [place], [place] [address] [phone] arrivance.com **home buyer education courses, foreclosure prevention  [facility] [address] [place], [place] [address] [phone] WirelessNovelties.no **Environmental Exposure Assessment (investigation of homes where either children or pregnant women with a confirmed elevated blood lead level reside)  [facility] of [facility]ational [facility], [place], [place] [address] [phone] http://www.perez.com/ **Home Expense Assistance/Repairs Program; offers home accessibility updates, such as ramps or bars in the bathroom  [facility]Help [facility] [address], [place], [place] [address] [phone] https://www.self-help.org/locations/[place]-branch **Offers credit-building and banking services to people unable to use [place] [facility] [place], [place], [place] [address] [phone] RoulettePays.com.br   [facility] [address] [place], [place] [address] [phone]  ClubMonetize.fr **housing applications/recertification, emergency and transitional housing  Open [employer] of [employer] [address] [place], [place] [address] [phone] www.odm-hp.org  **emergency and permanent  housing; rent/mortgage payment assistance  Habitat for [employer], [place] and [place] [address] [place], [place] [address] [phone] ArchitectReviews.com.au  [facility] [place], [employer] [place], [place], [place] [address] www.familyservice-piedmont.org **emergency shelter for victims of domestic violence and sexual assault  [facility] [address], [place],  [address] [phone] www.senior-resources-[place].org **Home expense assistance/repairs for older adults

## 2022-12-09 ENCOUNTER — Other Ambulatory Visit: Payer: Medicaid Other

## 2022-12-09 ENCOUNTER — Ambulatory Visit: Payer: Medicaid Other

## 2022-12-10 NOTE — BH Specialist Note (Unsigned)
Integrated Behavioral Health via Telemedicine Visit  12/10/2022 [name] [id number]  Number of [place] Clinician visits: 1- Initial Visit  Session Start time: 4315   Session End time: 4008  Total time in minutes: 40   Referring Provider: Liliane Channel, MD Patient/Family location: Home*** [facility] Provider location: Center for [place] at [facility]  All persons participating in visit: Patient Michelle Greer and [place] ***  Types of Service: {CHL AMB TYPE OF SERVICE:[phone]}  I connected with [name] and/or Judson Roch Rappa's {family members:20773} via  Telephone or Video Enabled Telemedicine Application  (Video is Caregility application) and verified that I am speaking with the correct person using two identifiers. Discussed confidentiality: Yes   I discussed the limitations of telemedicine and the availability of in person appointments.  Discussed there is a possibility of technology failure and discussed alternative modes of communication if that failure occurs.  I discussed that engaging in this telemedicine visit, they consent to the provision of behavioral healthcare and the services will be billed under their insurance.  Patient and/or legal guardian expressed understanding and consented to Telemedicine visit: Yes   Presenting Concerns: Patient and/or family reports the following symptoms/concerns: *** Duration of problem: ***; Severity of problem: {Mild/Moderate/Severe:20260}  Patient and/or Family's Strengths/Protective Factors: {CHL AMB BH PROTECTIVE FACTORS:[phone]}  Goals Addressed: Patient will:  Reduce symptoms of: {IBH Symptoms:21014056}   Increase knowledge and/or ability of: {IBH Patient Tools:21014057}   Demonstrate ability to: {IBH Goals:21014053}  Progress towards Goals: {CHL AMB BH PROGRESS TOWARDS [phone]}  Interventions: Interventions utilized:  {IBH  Interventions:21014054} Standardized Assessments completed: {IBH Screening Tools:21014051}  Patient and/or Family Response: Patient agrees with treatment plan. ***  Assessment: Patient currently experiencing ***.   Patient may benefit from continued therapeutic interventions***.  Plan: Follow up with behavioral health clinician on : *** Behavioral recommendations:  -*** -*** Referral(s): {IBH Referrals:21014055}  I discussed the assessment and treatment plan with the patient and/or parent/guardian. They were provided an opportunity to ask questions and all were answered. They agreed with the plan and demonstrated an understanding of the instructions.   They were advised to call back or seek an in-person evaluation if the symptoms worsen or if the condition fails to improve as anticipated.  Garlan Fair, LCSW     11/29/2022    4:49 PM 11/17/2022    3:41 PM 07/20/2018    2:18 PM  Depression screen PHQ 2/9  Decreased Interest '3 1 2  '$ Down, Depressed, Hopeless '2 1 3  '$ PHQ - 2 Score '5 2 5  '$ Altered sleeping '3 3 2  '$ Tired, decreased energy '3 3 2  '$ Change in appetite 1 3 0  Feeling bad or failure about yourself  '3 3 3  '$ Trouble concentrating 2 1 0  Moving slowly or fidgety/restless 1 0 1  Suicidal thoughts 1 1 0  PHQ-9 Score '19 16 13  '$ Difficult doing work/chores   Somewhat difficult      11/29/2022    4:50 PM 11/17/2022    3:39 PM 07/20/2018    2:19 PM  GAD 7 : Generalized Anxiety Score  Nervous, Anxious, on Edge 2 0 2  Control/stop worrying '2 3 3  '$ Worry too much - different things '3 3 3  '$ Trouble relaxing '2 1 2  '$ Restless 2 3 0  Easily annoyed or irritable '3 3 3  '$ Afraid - awful might happen 0 0 2  Total GAD 7 Score '14 13 15  '$ Anxiety Difficulty  Somewhat difficult

## 2022-12-13 ENCOUNTER — Encounter: Payer: Medicaid Other | Admitting: Obstetrics and Gynecology

## 2022-12-22 ENCOUNTER — Ambulatory Visit: Payer: Medicaid Other | Admitting: Clinical

## 2022-12-27 NOTE — BH Specialist Note (Signed)
Integrated Behavioral Health via Telemedicine Visit  01/10/2023 Halo Cardinali [id number]  Number of [place] Clinician visits: 2- Second Visit  Session Start time: [id number]   Session End time: [id number]  Total time in minutes: 26   Referring Provider: Liliane Channel, MD Patient/Family location: Home [facility] Provider location: Center for [place] at [facility]  All persons participating in visit: Patient Michelle Greer and [name]   Types of Service: Individual psychotherapy and Video visit  I connected with [name] and/or [name]  n/a  via  Telephone or Video Enabled Telemedicine Application  (Video is Caregility application) and verified that I am speaking with the correct person using two identifiers. Discussed confidentiality: Yes   I discussed the limitations of telemedicine and the availability of in person appointments.  Discussed there is a possibility of technology failure and discussed alternative modes of communication if that failure occurs.  I discussed that engaging in this telemedicine visit, they consent to the provision of behavioral healthcare and the services will be billed under their insurance.  Patient and/or legal guardian expressed understanding and consented to Telemedicine visit: Yes   Presenting Concerns: Patient and/or family reports the following symptoms/concerns: Job loss, not taking Zoloft or iron pills, increase in depression and financial stress after job loss.  Duration of problem: Increase in pregnancy; Severity of problem:  moderately severe  Patient and/or Family's Strengths/Protective Factors: Social connections and Sense of purpose  Goals Addressed: Patient will:  Reduce symptoms of: anxiety, depression, insomnia, and stress   Increase knowledge and/or ability of: stress reduction   Demonstrate ability to: Increase healthy adjustment to current life circumstances and Increase  motivation to adhere to plan of care  Progress towards Goals: Ongoing  Interventions: Interventions utilized:  Motivational Interviewing and Link to [employer] Assessments completed: Not Needed  Patient and/or Family Response: Patient agrees with treatment plan.   Assessment: Patient currently experiencing Mood disorder, unspecified; Psychosocial stress.   Patient may benefit from continued therapeutic interventions.  Plan: Follow up with behavioral health clinician on : Two weeks Behavioral recommendations:  -Begin taking Zoloft as prescribed (after picking up from pharmacy); take iron pills as prescribed -Check to make sure voicemail is set up and not full today (to receive callback from [facility] and ultrasound scheduling) -Continue plan to begin phlebotomy classes in March 2024 -Continue plan to read through 12/08/22 After Visit Summary of community resources; check email (including junk mail) for Pregnancy Navigator community resources Referral(s): [place] (In Clinic) and [employer] Resources:  Housing and [place]  I discussed the assessment and treatment plan with the patient and/or parent/guardian. They were provided an opportunity to ask questions and all were answered. They agreed with the plan and demonstrated an understanding of the instructions.   They were advised to call back or seek an in-person evaluation if the symptoms worsen or if the condition fails to improve as anticipated.  Garlan Fair, LCSW     11/29/2022    4:49 PM 11/17/2022    3:41 PM 07/20/2018    2:18 PM  Depression screen PHQ 2/9  Decreased Interest 3 1 2  $ Down, Depressed, Hopeless 2 1 3  $ PHQ - 2 Score 5 2 5  $ Altered sleeping 3 3 2  $ Tired, decreased energy 3 3 2  $ Change in appetite 1 3 0  Feeling bad or failure about yourself  3 3 3  $ Trouble concentrating 2 1 0  Moving slowly  or fidgety/restless 1 0 1  Suicidal thoughts 1 1 0  PHQ-9 Score 19  16 13  $ Difficult doing work/chores   Somewhat difficult      11/29/2022    4:50 PM 11/17/2022    3:39 PM 07/20/2018    2:19 PM  GAD 7 : Generalized Anxiety Score  Nervous, Anxious, on Edge 2 0 2  Control/stop worrying 2 3 3  $ Worry too much - different things 3 3 3  $ Trouble relaxing 2 1 2  $ Restless 2 3 0  Easily annoyed or irritable 3 3 3  $ Afraid - awful might happen 0 0 2  Total GAD 7 Score 14 13 15  $ Anxiety Difficulty   Somewhat difficult

## 2023-01-07 ENCOUNTER — Telehealth: Payer: Self-pay | Admitting: Family Medicine

## 2023-01-07 DIAGNOSIS — O0990 Supervision of high risk pregnancy, unspecified, unspecified trimester: Secondary | ICD-10-CM

## 2023-01-07 NOTE — Telephone Encounter (Signed)
Needs an order to be put in for an u/s

## 2023-01-10 ENCOUNTER — Telehealth: Payer: Self-pay | Admitting: Family Medicine

## 2023-01-10 ENCOUNTER — Ambulatory Visit (INDEPENDENT_AMBULATORY_CARE_PROVIDER_SITE_OTHER): Payer: Medicaid Other | Admitting: Clinical

## 2023-01-10 DIAGNOSIS — F39 Unspecified mood [affective] disorder: Secondary | ICD-10-CM | POA: Diagnosis not present

## 2023-01-10 DIAGNOSIS — Z658 Other specified problems related to psychosocial circumstances: Secondary | ICD-10-CM

## 2023-01-10 NOTE — Telephone Encounter (Signed)
Patient called in stating she was told by the ultrasound department that she needed another order put in for her ultrasound.

## 2023-01-10 NOTE — Telephone Encounter (Signed)
Scheduled ultrasound appt with MFM for 2/28. Called patient, no answer- left message stating I am trying to reach her regarding an appt that has been scheduled for her. Please check her mychart account for more information or call us back.

## 2023-01-12 NOTE — BH Specialist Note (Unsigned)
Integrated Behavioral Health via Telemedicine Visit  01/12/2023 Michelle Greer [id number]  Number of Integrated Behavioral Health Clinician visits: 2- Second Visit  Session Start time: 1316   Session End time: 1342  Total time in minutes: 26   Referring Provider: Sheppard Evens, MD Patient/Family location: Home*** [facility] Provider location: [facility] for [facility]  All persons participating in visit: Patient Michelle Greer and [facility] Michelle Greer ***  Types of Service: {CHL AMB TYPE OF SERVICE:[id number]}  I connected with Michelle Greer and/or Michelle Greer's {family members:20773} via  Telephone or Video Enabled Telemedicine Application  (Video is Caregility application) and verified that I am speaking with the correct person using two identifiers. Discussed confidentiality: Yes   I discussed the limitations of telemedicine and the availability of in person appointments.  Discussed there is a possibility of technology failure and discussed alternative modes of communication if that failure occurs.  I discussed that engaging in this telemedicine visit, they consent to the provision of behavioral healthcare and the services will be billed under their insurance.  Patient and/or legal guardian expressed understanding and consented to Telemedicine visit: Yes   Presenting Concerns: Patient and/or family reports the following symptoms/concerns: *** Duration of problem: ***; Severity of problem: {Mild/Moderate/Severe:20260}  Patient and/or Family's Strengths/Protective Factors: {CHL AMB BH PROTECTIVE FACTORS:[phone]}  Goals Addressed: Patient will:  Reduce symptoms of: {IBH Symptoms:21014056}   Increase knowledge and/or ability of: {IBH Patient Tools:21014057}   Demonstrate ability to: {IBH Goals:21014053}  Progress towards Goals: {CHL AMB BH PROGRESS TOWARDS GOALS:[phone]}  Interventions: Interventions utilized:  {IBH  Interventions:21014054} Standardized Assessments completed: {IBH Screening Tools:21014051}  Patient and/or Family Response: Patient agrees with treatment plan.   Assessment: Patient currently experiencing ***.   Patient may benefit from continued therapeutic interventions***.  Plan: Follow up with behavioral health clinician on : *** Behavioral recommendations:  -*** -*** Referral(s): {IBH Referrals:21014055}  I discussed the assessment and treatment plan with the patient and/or parent/guardian. They were provided an opportunity to ask questions and all were answered. They agreed with the plan and demonstrated an understanding of the instructions.   They were advised to call back or seek an in-person evaluation if the symptoms worsen or if the condition fails to improve as anticipated.  Michelle Lips, LCSW     11/29/2022    4:49 PM 11/17/2022    3:41 PM 07/20/2018    2:18 PM  Depression screen PHQ 2/9  Decreased Interest 3 1 2   Down, Depressed, Hopeless 2 1 3   PHQ - 2 Score 5 2 5   Altered sleeping 3 3 2   Tired, decreased energy 3 3 2   Change in appetite 1 3 0  Feeling bad or failure about yourself  3 3 3   Trouble concentrating 2 1 0  Moving slowly or fidgety/restless 1 0 1  Suicidal thoughts 1 1 0  PHQ-9 Score 19 16 13   Difficult doing work/chores   Somewhat difficult      11/29/2022    4:50 PM 11/17/2022    3:39 PM 07/20/2018    2:19 PM  GAD 7 : Generalized Anxiety Score  Nervous, Anxious, on Edge 2 0 2  Control/stop worrying 2 3 3   Worry too much - different things 3 3 3   Trouble relaxing 2 1 2   Restless 2 3 0  Easily annoyed or irritable 3 3 3   Afraid - awful might happen 0 0 2  Total GAD 7 Score 14 13 15   Anxiety Difficulty  Somewhat difficult

## 2023-01-12 NOTE — Progress Notes (Signed)
   PRENATAL VISIT NOTE  Subjective:  Michelle Greer is a 27 y.o. [phone] at 43w1dbeing seen today for ongoing prenatal care.  She is currently monitored for the following issues for this low-risk pregnancy and has History of postpartum hypertension; Supervision of high risk pregnancy, antepartum; and Anemia of pregnancy on their problem list.  Patient reports  normal aches/pains of pregnancy - pressure, back pain, hip pain with sleeping .  Contractions: Irregular. Vag. Bleeding: None.  Movement: Present. Denies leaking of fluid.   The following portions of the patient's history were reviewed and updated as appropriate: allergies, current medications, past family history, past medical history, past social history, past surgical history and problem list.   Objective:   Vitals:   01/17/23 1000  BP: 127/79  Pulse: 93  Weight: 174 lb (78.9 kg)    Fetal Status: Fetal Heart Rate (bpm): 140 Fundal Height: 34 cm Movement: Present     General:  Alert, oriented and cooperative. Patient is in no acute distress.  Skin: Skin is warm and dry. No rash noted.   Cardiovascular: Normal heart rate noted  Respiratory: Normal respiratory effort, no problems with respiration noted  Abdomen: Soft, gravid, appropriate for gestational age.  Pain/Pressure: Present     Pelvic: Cervical exam deferred        Extremities: Normal range of motion.  Edema: Trace  Mental Status: Normal mood and affect. Normal behavior. Normal judgment and thought content.   Assessment and Plan:  Pregnancy: [id number] 374w1d. Supervision of high risk pregnancy, antepartum Offered and recommended TDAP - pt will consider. Reviewed benefits of vaccination.  MOC: She is thinking likely Depo.  28 wk labs: in process Rh pos  2. Anemia during pregnancy in third trimester Mild anemia on 1/8.   Preterm labor symptoms and general obstetric precautions including but not limited to vaginal bleeding, contractions, leaking of fluid and fetal  movement were reviewed in detail with the patient. Please refer to After Visit Summary for other counseling recommendations.   Return in about 2 weeks (around 01/31/2023).  Future Appointments  Date Time Provider [place]2/26/2024 10:55 AM DuRadene GunningMD [facility]2/28/2024  2:30 PM [facility]2/28/2024  2:45 PM WMC-MFC US6 [facility]MFCUS [facility]3/04/2023  1:45 PM [facility]MBethelMV[facility]  PaRadene GunningMD

## 2023-01-14 ENCOUNTER — Other Ambulatory Visit: Payer: Self-pay | Admitting: *Deleted

## 2023-01-14 DIAGNOSIS — O0990 Supervision of high risk pregnancy, unspecified, unspecified trimester: Secondary | ICD-10-CM

## 2023-01-17 ENCOUNTER — Ambulatory Visit (INDEPENDENT_AMBULATORY_CARE_PROVIDER_SITE_OTHER): Payer: Medicaid Other | Admitting: Obstetrics and Gynecology

## 2023-01-17 ENCOUNTER — Other Ambulatory Visit: Payer: Medicaid Other

## 2023-01-17 VITALS — BP 127/79 | HR 93 | Wt 174.0 lb

## 2023-01-17 DIAGNOSIS — Z3A34 34 weeks gestation of pregnancy: Secondary | ICD-10-CM | POA: Diagnosis not present

## 2023-01-17 DIAGNOSIS — O0990 Supervision of high risk pregnancy, unspecified, unspecified trimester: Secondary | ICD-10-CM

## 2023-01-17 DIAGNOSIS — O0993 Supervision of high risk pregnancy, unspecified, third trimester: Secondary | ICD-10-CM

## 2023-01-17 DIAGNOSIS — O99013 Anemia complicating pregnancy, third trimester: Secondary | ICD-10-CM | POA: Diagnosis not present

## 2023-01-17 LAB — OB RESULTS CONSOLE RPR: RPR: NONREACTIVE

## 2023-01-17 LAB — OB RESULTS CONSOLE HIV ANTIBODY (ROUTINE TESTING): HIV: NONREACTIVE

## 2023-01-17 NOTE — Progress Notes (Signed)
Alert received through NCNotify system as follows: Patient identified as not having a recommended prenatal visit. Patient is [redacted] weeks pregnant. Most recent prenatal visit was on 11/29/2022 at [place].  Chart review: Patient's last visit was on 11/29/2022. Next appointment is scheduled for 03/06. Will follow up with patient at this appointment.   No action needed.  Martina Sinner, RN 01/17/2023  9:34 AM

## 2023-01-18 LAB — CBC
Hematocrit: 28.7 % — ABNORMAL LOW (ref 34.0–46.6)
Hemoglobin: 9.3 g/dL — ABNORMAL LOW (ref 11.1–15.9)
MCH: 26.2 pg — ABNORMAL LOW (ref 26.6–33.0)
MCHC: 32.4 g/dL (ref 31.5–35.7)
MCV: 81 fL (ref 79–97)
Platelets: 299 x10E3/uL (ref 150–450)
RBC: 3.55 x10E6/uL — ABNORMAL LOW (ref 3.77–5.28)
RDW: 13.6 % (ref 11.7–15.4)
WBC: 6.1 x10E3/uL (ref 3.4–10.8)

## 2023-01-18 LAB — GLUCOSE TOLERANCE, 2 HOURS W/ 1HR
Glucose, 1 hour: 148 mg/dL (ref 70–179)
Glucose, 2 hour: 107 mg/dL (ref 70–152)
Glucose, Fasting: 76 mg/dL (ref 70–91)

## 2023-01-18 LAB — HIV ANTIBODY (ROUTINE TESTING W REFLEX): HIV Screen 4th Generation wRfx: NONREACTIVE

## 2023-01-18 LAB — SYPHILIS: RPR W/REFLEX TO RPR TITER AND TREPONEMAL ANTIBODIES, TRADITIONAL SCREENING AND DIAGNOSIS ALGORITHM: RPR Ser Ql: NONREACTIVE

## 2023-01-19 ENCOUNTER — Telehealth: Payer: Self-pay | Admitting: Pharmacy Technician

## 2023-01-19 ENCOUNTER — Ambulatory Visit (HOSPITAL_BASED_OUTPATIENT_CLINIC_OR_DEPARTMENT_OTHER): Payer: Medicaid Other

## 2023-01-19 ENCOUNTER — Ambulatory Visit: Payer: Medicaid Other | Attending: Maternal & Fetal Medicine | Admitting: Maternal & Fetal Medicine

## 2023-01-19 ENCOUNTER — Inpatient Hospital Stay (HOSPITAL_COMMUNITY)
Admission: AD | Admit: 2023-01-19 | Discharge: 2023-01-23 | DRG: 806 | Disposition: A | Discharge status: Home / Self Care | Payer: Medicaid Other | Attending: Obstetrics and Gynecology | Admitting: Obstetrics and Gynecology

## 2023-01-19 ENCOUNTER — Ambulatory Visit: Payer: Medicaid Other | Admitting: *Deleted

## 2023-01-19 ENCOUNTER — Other Ambulatory Visit: Payer: Self-pay

## 2023-01-19 ENCOUNTER — Other Ambulatory Visit: Payer: Self-pay | Admitting: Family Medicine

## 2023-01-19 ENCOUNTER — Encounter (HOSPITAL_COMMUNITY): Payer: Self-pay | Admitting: Obstetrics and Gynecology

## 2023-01-19 ENCOUNTER — Other Ambulatory Visit: Payer: Self-pay | Admitting: Obstetrics and Gynecology

## 2023-01-19 VITALS — BP 130/65

## 2023-01-19 DIAGNOSIS — O4100X0 Oligohydramnios, unspecified trimester, not applicable or unspecified: Secondary | ICD-10-CM | POA: Diagnosis not present

## 2023-01-19 DIAGNOSIS — Z3A34 34 weeks gestation of pregnancy: Secondary | ICD-10-CM

## 2023-01-19 DIAGNOSIS — O0990 Supervision of high risk pregnancy, unspecified, unspecified trimester: Secondary | ICD-10-CM

## 2023-01-19 DIAGNOSIS — O4103X1 Oligohydramnios, third trimester, fetus 1: Secondary | ICD-10-CM

## 2023-01-19 DIAGNOSIS — O4103X0 Oligohydramnios, third trimester, not applicable or unspecified: Secondary | ICD-10-CM | POA: Diagnosis present

## 2023-01-19 DIAGNOSIS — O365990 Maternal care for other known or suspected poor fetal growth, unspecified trimester, not applicable or unspecified: Secondary | ICD-10-CM

## 2023-01-19 DIAGNOSIS — O0933 Supervision of pregnancy with insufficient antenatal care, third trimester: Secondary | ICD-10-CM | POA: Diagnosis not present

## 2023-01-19 DIAGNOSIS — O365930 Maternal care for other known or suspected poor fetal growth, third trimester, not applicable or unspecified: Secondary | ICD-10-CM | POA: Diagnosis not present

## 2023-01-19 DIAGNOSIS — O283 Abnormal ultrasonic finding on antenatal screening of mother: Secondary | ICD-10-CM

## 2023-01-19 DIAGNOSIS — O9902 Anemia complicating childbirth: Secondary | ICD-10-CM | POA: Diagnosis present

## 2023-01-19 DIAGNOSIS — O99019 Anemia complicating pregnancy, unspecified trimester: Secondary | ICD-10-CM | POA: Diagnosis present

## 2023-01-19 DIAGNOSIS — D509 Iron deficiency anemia, unspecified: Secondary | ICD-10-CM | POA: Diagnosis present

## 2023-01-19 LAB — URINALYSIS, ROUTINE W REFLEX MICROSCOPIC
Bilirubin Urine: NEGATIVE
Glucose, UA: NEGATIVE mg/dL
Hgb urine dipstick: NEGATIVE
Ketones, ur: 20 mg/dL — AB
Leukocytes,Ua: NEGATIVE
Nitrite: NEGATIVE
Protein, ur: 30 mg/dL — AB
Specific Gravity, Urine: 1.018 (ref 1.005–1.030)
pH: 6 (ref 5.0–8.0)

## 2023-01-19 LAB — CBC
HCT: 29.1 % — ABNORMAL LOW (ref 36.0–46.0)
Hemoglobin: 9.5 g/dL — ABNORMAL LOW (ref 12.0–15.0)
MCH: 26.7 pg (ref 26.0–34.0)
MCHC: 32.6 g/dL (ref 30.0–36.0)
MCV: 81.7 fL (ref 80.0–100.0)
Platelets: 314 10*3/uL (ref 150–400)
RBC: 3.56 MIL/uL — ABNORMAL LOW (ref 3.87–5.11)
RDW: 14.4 % (ref 11.5–15.5)
WBC: 8.7 10*3/uL (ref 4.0–10.5)
nRBC: 0 % (ref 0.0–0.2)

## 2023-01-19 LAB — TYPE AND SCREEN
ABO/RH(D): O POS
Antibody Screen: NEGATIVE

## 2023-01-19 MED ORDER — ACETAMINOPHEN 325 MG PO TABS
650.0000 mg | ORAL_TABLET | ORAL | Status: DC | PRN
  Administered 2023-01-19: 650 mg via ORAL
  Filled 2023-01-19: qty 2

## 2023-01-19 MED ORDER — LACTATED RINGERS IV BOLUS
1000.0000 mL | Freq: Once | INTRAVENOUS | Status: AC
  Administered 2023-01-19: 1000 mL via INTRAVENOUS

## 2023-01-19 MED ORDER — SODIUM CHLORIDE 0.9% FLUSH: 3.0000 mL | INTRAVENOUS | Status: DC | PRN

## 2023-01-19 MED ORDER — SODIUM CHLORIDE 0.9% FLUSH
3.0000 mL | Freq: Two times a day (BID) | INTRAVENOUS | Status: DC
  Administered 2023-01-19 – 2023-01-20 (×3): 3 mL via INTRAVENOUS

## 2023-01-19 MED ORDER — PRENATAL MULTIVITAMIN CH
1.0000 | ORAL_TABLET | Freq: Every day | ORAL | Status: DC
  Administered 2023-01-20: 1 via ORAL
  Filled 2023-01-19: qty 1

## 2023-01-19 MED ORDER — SODIUM CHLORIDE 0.9 % IV SOLN: 250.0000 mL | INTRAVENOUS | Status: DC | PRN

## 2023-01-19 MED ORDER — CALCIUM CARBONATE ANTACID 500 MG PO CHEW: 2.0000 | CHEWABLE_TABLET | ORAL | Status: DC | PRN

## 2023-01-19 MED ORDER — LACTATED RINGERS IV SOLN: 125.0000 mL/h | INTRAVENOUS | Status: AC

## 2023-01-19 MED ORDER — DOCUSATE SODIUM 100 MG PO CAPS
100.0000 mg | ORAL_CAPSULE | Freq: Every day | ORAL | Status: DC
  Administered 2023-01-19 – 2023-01-20 (×2): 100 mg via ORAL
  Filled 2023-01-19 (×3): qty 1

## 2023-01-19 MED ORDER — BETAMETHASONE SOD PHOS & ACET 6 (3-3) MG/ML IJ SUSP
12.0000 mg | INTRAMUSCULAR | Status: AC
  Administered 2023-01-19 – 2023-01-20 (×2): 12 mg via INTRAMUSCULAR
  Filled 2023-01-19: qty 5

## 2023-01-19 NOTE — Telephone Encounter (Signed)
Dr. Damita Dunnings, Juluis Rainier note:  Auth Submission: NO AUTH NEEDED Payer: [employer] MEDICAID Medication & CPT/J Code(s) submitted: Venofer (Iron Sucrose) [id number] Route of submission (phone, fax, portal):  Phone # Fax # Auth type: Buy/Bill Units/visits requested: 5 Reference number:  Approval from: 01/19/23 to 06/19/23   Patient will be scheduled as soon as possible.

## 2023-01-19 NOTE — Progress Notes (Signed)
MFM Consult Note Patient Name: Michelle Greer  Patient MRN:   [id number]  Referring provider: No referring provider defined for this encounter. Reason for Consult: Fetal growth restriction, BPP 4/8, elevated dopplers, fetal cardiomegaly   HPI: Michelle Greer is a 27 y.o. [id number] at 14w3dhere for ultrasound and consultation.   RE fetal growth restriction: Severe fetal growth restriction was seen on today's ultrasound.  This is the first ultrasound the patient has had since 14 weeks 6 days.  The estimated fetal weight is in the less than the 1st percentile with an estimation of 1600 g on today's ultrasound.  The abdominal circumference head circumference and femur length are all less than the 1st percentile.  The other long bones are between the first and 5th percentile. The chest to abdomen circumference ratio was 0.92 (less than 0.9 as a predictor of lethality and skeletal dysplasias) in the femur to APalm [facility] Medical Centerratio is 0.23 (less than 0.16 is a predictor of lethality for skeletal dysplasias).  The heart is uniformly enlarged suggestive of cardiomyopathy.  There is no pericardial effusion.  The outflow tracts appear appropriately arranged.  The cardiac axis appears normal.  While this was not an official fetal echo the ventricles and atria appear to be contracting appropriately.  There is also very little fluid in the fetus did not show practice breathing.  I discussed the urgency of going to the MAU promptly for admission.  I also discussed that there is a high rate of stillbirth and she may need a urgent delivery dependent upon the fetal tracing.  I also discussed that the Dopplers were elevated today but without signs of reversed flow.  The patient verbalized she understands the urgency of the situation and will go directly to the MAU.  I have spoken with Dr. ETrevor Macestat who is aware of the plan.  The NICU should be notified promptly about the fetal size and cardiomegaly.   Review of Systems: A review of systems was  performed and was negative except per HPI   Vitals and Physical Exam BP 130/65, HR 89, pregravid BMI: 22.  Emotionally disturbed after receiving news about her fetus Nonlabored breathing Normal rate and rhythm Abdomen is nontender  Genetic testing: Normal  Sonographic findings:  Single intrauterine pregnancy. Fetal cardiac activity:  Observed and appears normal. Presentation: Cephalic. Severe fetal growth restriction was seen on today's ultrasound.  This is the first ultrasound the patient has had since 14 weeks 6 days.  The estimated fetal weight is in the less than the 1st percentile with an estimation of 1600 g on today's ultrasound.  The abdominal circumference head circumference and femur length are all less than the 1st percentile.  The other long bones are between the first and 5th percentile. The chest to abdomen circumference ratio was 0.92 (less than 0.9 as a predictor of lethality and skeletal dysplasias) in the femur to ALivingston Hospital And Healthcare Servicesratio is 0.23 (less than 0.16 is a predictor of lethality for skeletal dysplasias).  The heart is uniformly enlarged suggestive of cardiomyopathy.  There is no pericardial effusion.  The outflow tracts appear appropriately arranged.  The cardiac axis appears normal.  While this was not an official fetal echo the ventricles and atria appear to be contracting appropriately.  There is also very little fluid in the fetus did not show practice breathing.  Fetal biometry shows the estimated fetal weight at the < 1st percentile and the abdominal circumference at the <1st percentile.  The long bones appear normally shaped  with normal mineralization without evidence of fracture. Amniotic fluid volume: Oligohydramnios. AFI: 5.77 cm.  MVP: 3.17 cm. But no fluid pockets are >2 cm wide. This is consistent with oligohydramnios.  Placenta: Posterior. Umbilical artery dopplers findings: -S/D:3.96 which are elevated at this gestational age.  -Absent end-diastolic flow: No.   -Reversed end-diastolic flow:  No. BPP 4/8 (-2 breathing and fluid)  Assessment -Fetal growth restriction, placental origin but skeletal dysplasia must also be considered although it is less likely the cause of the growth restriction given the normal-appearing long bones Plan -Sent directly to the MAU for admission and consideration of delivery. -Timing of delivery will depend upon the fetal heart tracing.  If the fetal heart tracing is not reassuring and urgent cesarean delivery should be performed.  If the fetal heart tracing is reactive, steroids for fetal lung maturity are recommended due to the small chest to heart ratio and less than [redacted] week gestation.  Continuous fetal monitoring should be done with a very low threshold for delivery. -Notify NICU and pediatric cardiology regarding the patient's status upon admission given the EFW of 1600g with the fetal cardiomegaly (unknown origin, possibly a component of the FGR). If the fetal status allows a fetal echo would be helpful for postnatal planning.  -I have called the on call provider to notify them about her arrival  I spent 30 minutes reviewing the patients chart, including labs and images as well as counseling the patient about her medical conditions.  [name]  MFM, [place]   01/19/2023  4:43 PM

## 2023-01-19 NOTE — MAU Note (Signed)
...  Vernestine Rients is a 27 y.o. at 35w3dhere in MAU reporting: Sent over from MFM for fetal monitoring. Denies VB or LOF. +FM.   Pain score: 10/10 contractions - lower abdomen   FHT: 150 initial external Lab orders placed from triage:  UA

## 2023-01-19 NOTE — H&P (Signed)
History     CSN: [id number]  Arrival date and time: 01/19/23 1654   Event Date/Time   First Provider Initiated Contact with Patient 01/19/23 1733      Chief Complaint  Patient presents with   Contractions   Abdominal Pain   Fetal Monitoring   Marvine Shipe , a  27 y.o. [id number] at [id number] to MAU after being sent over from MFM for Severe IUGR with elevated, cardiomegaly, and oligohydramnios with a 4/8 BPP today in office. Patient states she is also having some abdominal "tightening" that started upon arrival to MAU. She denies abnormal vaginal discharge, vaginal bleeding and endorses positive fetal movement. Patient has been NPO since 10pm last night.         OB History     Gravida  4   Para  2   Term  2   Preterm  0   AB  1   Living  2      SAB  1   IAB  0   Ectopic  0   Multiple  0   Live Births  2           Past Medical History:  Diagnosis Date   Anemia    Bronchitis    Complication of anesthesia    Depression    History of postpartum hypertension 05/24/2018   Irregular menstrual bleeding    Trichomonosis     Past Surgical History:  Procedure Laterality Date   WISDOM TOOTH EXTRACTION      Family History  Problem Relation Age of Onset   Asthma Mother     Social History   Tobacco Use   Smoking status: Never   Smokeless tobacco: Never  Vaping Use   Vaping Use: Never used  Substance Use Topics   Alcohol use: No   Drug use: No    Allergies:  Allergies  Allergen Reactions   Tomato Hives    Medications Prior to Admission  Medication Sig Dispense Refill Last Dose   albuterol (VENTOLIN HFA) 108 (90 Base) MCG/ACT inhaler Inhale 2 puffs into the lungs every 6 (six) hours as needed for wheezing or shortness of breath. 8 g 2 Past Week   aspirin EC 81 MG tablet Take 1 tablet (81 mg total) by mouth daily. Swallow whole. 30 tablet 3 01/18/2023   Prenatal Vit-Fe Fumarate-FA (PRENATAL VITAMIN PO) Take 1 tablet by mouth daily.    01/18/2023   sertraline (ZOLOFT) 25 MG tablet Take 1 tablet (25 mg total) by mouth daily for 14 days, THEN 2 tablets (50 mg total) daily. 106 tablet 0 01/18/2023   Blood Pressure Monitoring (BLOOD PRESSURE KIT) DEVI 1 Device by Does not apply route as needed. (Patient not taking: Reported on 11/29/2022) 1 each 0    ferrous sulfate 325 (65 FE) MG EC tablet Take 1 tablet (325 mg total) by mouth every other day. 30 tablet 2    Prenatal Vit-Fe Fumarate-FA (PRENATAL MULTIVITAMIN) TABS tablet Take 1 tablet by mouth daily at 12 noon. (Patient not taking: Reported on 11/29/2022) 90 tablet 3     Review of Systems  Constitutional:  Negative for chills, fatigue and fever.  Eyes:  Negative for pain and visual disturbance.  Respiratory:  Negative for apnea, shortness of breath and wheezing.   Cardiovascular:  Negative for chest pain and palpitations.  Gastrointestinal:  Positive for abdominal pain. Negative for constipation, diarrhea, nausea and vomiting.  Genitourinary:  Negative for difficulty urinating, dysuria, pelvic pain, vaginal bleeding,  vaginal discharge and vaginal pain.  Musculoskeletal:  Negative for back pain.  Neurological:  Negative for seizures, weakness and headaches.  Psychiatric/Behavioral:  Negative for suicidal ideas.    Physical Exam   Blood pressure 130/72, pulse 84, temperature 97.9 F (36.6 C), temperature source Oral, resp. rate 15, height '5\' 6"'$  (1.676 m), weight 79.3 kg, last menstrual period 04/25/2022, SpO2 100 %, unknown if currently breastfeeding.  Physical Exam Vitals and nursing note reviewed.  Constitutional:      General: She is not in acute distress.    Appearance: Normal appearance.  HENT:     Head: Normocephalic.  Pulmonary:     Effort: Pulmonary effort is normal.  Abdominal:     Tenderness: There is no abdominal tenderness.  Musculoskeletal:     Cervical back: Normal range of motion.  Skin:    General: Skin is warm and dry.     Capillary Refill: Capillary  refill takes less than 2 seconds.  Neurological:     Mental Status: She is alert and oriented to person, place, and time.  Psychiatric:        Mood and Affect: Mood normal.    FHT: 140 bpm with moderate variability. 10x10 accels present. Episodic variable upon admission but none since (appropriate for gestational age)  [place]oco: Irregular contractions with UI. (Patient unaware of most of them).   MAU Course  Procedures Orders Placed This Encounter  Procedures   Urinalysis, Routine w reflex microscopic -Urine, Clean Catch   Insert peripheral IV   Orders Placed This Encounter  Procedures   Urinalysis, Routine w reflex microscopic -Urine, Clean Catch   CBC   Diet regular Room service appropriate? Yes; Fluid consistency: Thin   Notify physician (specify)   Vital signs   Defer vaginal exam for vaginal bleeding or PROM <37 weeks   Apply Antepartum Care Plan   Initiate Oral Care Protocol   Initiate Carrier Fluid Protocol   SCDs   Fetal monitoring   Activity as tolerated   Full code   Consult to neonatology Consult Timeframe: URGENT - requires response within 12 hours; URGENT timeframe requires provider to provider communication, has the provider to provider communication been completed: Yes; Reason for Consult? fetal cardiomeg...   Type and screen [place]   Insert peripheral IV   Admit to Inpatient (patient's expected length of stay will be greater than 2 midnights or inpatient only procedure)     MDM - given patients high risk situation in current pregnancy, plan to admit to [facility] with reactive tracing for BMZ and ultimately delivery (Please see MFM note for details) .  - Reviewed plan of care with Dr. Elgie Congo. And he agrees. MD to place admit orders.  - Patient aware of plan of care and agrees with management.   Assessment and Plan  - Admit to [place], MSN CNM  01/19/2023, 5:33 PM

## 2023-01-20 DIAGNOSIS — O283 Abnormal ultrasonic finding on antenatal screening of mother: Secondary | ICD-10-CM

## 2023-01-20 DIAGNOSIS — O4103X0 Oligohydramnios, third trimester, not applicable or unspecified: Secondary | ICD-10-CM

## 2023-01-20 DIAGNOSIS — O365990 Maternal care for other known or suspected poor fetal growth, unspecified trimester, not applicable or unspecified: Secondary | ICD-10-CM

## 2023-01-20 DIAGNOSIS — O365930 Maternal care for other known or suspected poor fetal growth, third trimester, not applicable or unspecified: Principal | ICD-10-CM

## 2023-01-20 DIAGNOSIS — Z3A34 34 weeks gestation of pregnancy: Secondary | ICD-10-CM

## 2023-01-20 MED ORDER — HYDROXYZINE HCL 25 MG PO TABS
25.0000 mg | ORAL_TABLET | Freq: Three times a day (TID) | ORAL | Status: DC | PRN
  Administered 2023-01-20: 25 mg via ORAL
  Filled 2023-01-20: qty 1

## 2023-01-20 NOTE — Progress Notes (Addendum)
ADDENDUM - 6:04PM Tracing reviewed for the past hour in prep for transfer. 140bpm, moderate variability, no accels, no decels.  Plan to move to LDR for IOL when bed available. Plan FB/pit. Will remain on cEFM.   Gale Journey, MD Obstetrician & Gynecologist, [facility] for [facility], [place] PROGRESS NOTE  Michelle Greer is a 27 y.o. [phone] at 65w4dwho is admitted for newly diagnosed FGR <1% with elevated UAD, oligohydramnios, cardiomegaly Fetal presentation is cephalic.  Length of Stay:  1 Days. Admitted 01/19/2023  Subjective: No complaints this AM. Concerned about her baby and implications for preterm delivery.  Patient reports good fetal movement.  She denies uterine contractions, no bleeding and no loss of fluid per vagina.  Vitals:  Blood pressure (!) 127/55, pulse (!) 106, temperature (!) 97.5 F (36.4 C), temperature source Oral, resp. rate 18, height 5' 6"$  (1.676 m), weight 79.3 kg, last menstrual period 04/25/2022, SpO2 99 %, unknown if currently breastfeeding. Physical Examination: CONSTITUTIONAL: Well-developed, well-nourished female in no acute distress.  NEUROLOGIC: Alert and oriented to person, place, and time. No cranial nerve deficit noted. PSYCHIATRIC: Normal mood and affect. Normal behavior. Normal judgment and thought content. Appropriately tearful at times CARDIOVASCULAR: Mild tachycardia RESPIRATORY: Effort normal, no problems with respiration noted ABDOMEN: Soft, nontender, nondistended, gravid.  Fetal monitoring: FHR: 140 bpm, Variability: moderate, Accelerations: Present, Decelerations: 1 late deceleration Uterine activity: 1 contractions per hour  Results for orders placed or performed during the hospital encounter of 01/19/23 (from the past 48 hour(s))  Urinalysis, Routine w reflex microscopic -Urine, Clean Catch     Status: Abnormal   Collection Time: 01/19/23  6:15 PM   Result Value Ref Range   Color, Urine YELLOW YELLOW   APPearance CLEAR CLEAR   Specific Gravity, Urine 1.018 1.005 - 1.030   pH 6.0 5.0 - 8.0   Glucose, UA NEGATIVE NEGATIVE mg/dL   Hgb urine dipstick NEGATIVE NEGATIVE   Bilirubin Urine NEGATIVE NEGATIVE   Ketones, ur 20 (A) NEGATIVE mg/dL   Protein, ur 30 (A) NEGATIVE mg/dL   Nitrite NEGATIVE NEGATIVE   Leukocytes,Ua NEGATIVE NEGATIVE   RBC / HPF 0-5 0 - 5 RBC/hpf   WBC, UA 0-5 0 - 5 WBC/hpf   Bacteria, UA RARE (A) NONE SEEN   Squamous Epithelial / HPF 0-5 0 - 5 /HPF   Mucus PRESENT     Comment: Performed at [facility] [address] [place] [address] [place] and screen [place]    Status: None   Collection Time: 01/19/23  7:03 PM  Result Value Ref Range   ABO/RH(D) O POS    Antibody Screen NEG    Sample Expiration      01/22/2023,2359 Performed at [facility] [id number], [id number]  CBC     Status: Abnormal   Collection Time: 01/19/23  7:05 PM  Result Value Ref Range   WBC 8.7 4.0 - 10.5 K/uL   RBC 3.56 (L) 3.87 - 5.11 MIL/uL   Hemoglobin 9.5 (L) 12.0 - 15.0 g/dL   HCT 29.1 (L) 36.0 - 46.0 %   MCV 81.7 80.0 - 100.0 fL   MCH 26.7 26.0 - 34.0 pg   MCHC 32.6 30.0 - 36.0 g/dL   RDW 14.4 11.5 - 15.5 %   Platelets 314 150 - 400 K/uL   nRBC 0.0 0.0 - 0.2 %  Comment: Performed at [facility], [place] [address], [place], [place] [address]    [place] MFM OB DETAIL +14 WK  Result Date: 01/19/2023 ----------------------------------------------------------------------  OBSTETRICS REPORT                       (Signed Final 01/19/2023 05:20 pm) ---------------------------------------------------------------------- Patient Info  ID #:       [id number]                          D.O.B.:  1996/06/19 (26 yrs)  Name:       Michelle Greer                     Visit Date: 01/19/2023 02:51 pm ---------------------------------------------------------------------- Performed By   Attending:        [name] DO       Referred By:      Varna  Performed By:     Nevin Bloodgood          [facility]                                     Fetal Care at                                                             [place] for                                                             Women ---------------------------------------------------------------------- Orders  #  Description                           Code        Ordered By  1  [place] MFM OB DETAIL +14 [place]               [address]11.01    [place]  2  [place] MFM FETAL BPP WO NON               76819.01    [place]  3  [place] MFM UA CORD DOPPLER                [id number]    CHIAGOZIEM NDULUE ----------------------------------------------------------------------  #  Order #  Accession #                Episode #  1  [id number]                   [id number]                 [id number]  2  [id number]                   [id number]                 [id number]  3  [id number]                   [id number]                 [id number] ---------------------------------------------------------------------- Indications  Late to prenatal care, third trimester         O09.33  Encounter for antenatal screening for          Z36.3  malformations  Sever fetal growth restriction, third trimesterO36.5930  Oligohydramnios / Decreased amniotic fluid     O41.00X0  volume  Abnormal fetal ultrasound - BPP 4/8            O28.9  [redacted] weeks gestation of pregnancy                Z3A.34  LR NIPS/Neg Horizon ---------------------------------------------------------------------- Fetal Evaluation  Num Of Fetuses:         1  Fetal Heart Rate(bpm):  146  Cardiac Activity:       Observed  Presentation:           Cephalic  Placenta:               Posterior  P. Cord Insertion:      Not well visualized  Amniotic Fluid  AFI FV:      Oligohydramnios   AFI Sum(cm)     %Tile       Largest Pocket(cm)  5.77            < 3         3.17  RUQ(cm)       RLQ(cm)       LUQ(cm)        LLQ(cm)  1.22          0             1.38           3.17  Comment:    No pocket >2 cm in diameter ---------------------------------------------------------------------- Biophysical Evaluation  Amniotic F.V:   Oligohydramnios            F. Tone:        Observed  F. Movement:    Observed                   Score:          4/8  F. Breathing:   Not Observed ---------------------------------------------------------------------- Biometry  BPD:      80.3  mm     G. Age:  32w 2d        4.2  %    CI:        79.68   %    70 - 86  FL/HC:      20.9   %    19.4 - 21.8  HC:      284.3  mm     G. Age:  31w 1d        < 1  %    HC/AC:      1.10        0.96 - 1.11  AC:      257.8  mm     G. Age:  30w 0d        < 1  %    FL/BPD:     74.1   %    71 - 87  FL:       59.5  mm     G. Age:  31w 0d        < 1  %    FL/AC:      23.1   %    20 - 24  HUM:      54.1  mm     G. Age:  31w 3d        < 5  %  TC:    228.91   mm                            3.8  %   RIGHT  HUM:      52.8  mm     G. Age:  30w 5d        < 5  %  ULN:      45.2  mm     G. Age:  29w 0d        < 5  %  TIB:      52.9  mm     G. Age:  31w 2d        < 5  %  RAD:        45  mm     G. Age:  32w 0d         38  %  FIB:      54.1  mm     G. Age:  33w 1d         34  %  Foot:     68.3  mm     G. Age:  34w 1d         20  %   LEFT  HUM:      52.9  mm     G. Age:  30w 6d        < 5  %  ULN:      46.6  mm     G. Age:  29w 5d        < 5  %  TIB:      55.5  mm     G. Age:  32w 5d         28  %  RAD:      53.4  mm     G. Age:  N/A            87  %  FIB:        53  mm     G. Age:  32w 4d         52  %  Est. FW:    1600  gm      3 lb 8 oz    < 1  % ----------------------------------------------------------------------  OB History  Blood Type:   O+  Maternal Racial/Ethnic Group:   Black (non-Hispanic)  Gravidity:    4          Term:   2        Prem:   0        SAB:   1  TOP:          0       Ectopic:  0        Living: 2 ---------------------------------------------------------------------- Gestational Age  LMP:           38w 3d        Date:  04/25/22                   EDD:   01/30/23  U/S Today:     31w 1d                                        EDD:   03/22/23  Best:          34w 3d     Det. By:  Previous Ultrasound      EDD:   02/27/23                                      (09/04/22) ---------------------------------------------------------------------- Anatomy  Cranium:               Appears normal         LVOT:                   Appears normal  Cavum:                 Appears normal         Aortic Arch:            Appears normal  Ventricles:            Not well visualized    Ductal Arch:            Appears normal  Choroid Plexus:        Not well visualized    Diaphragm:              Appears normal  Cerebellum:            Not well visualized    Stomach:                Appears normal, left                                                                        sided  Posterior Fossa:       Not well visualized    Abdomen:                Appears normal  Nuchal Fold:           Not applicable (Q000111Q    Abdominal Wall:         Not well visualized  wks GA)  Face:                  Orbits nl; profile not Cord Vessels:           Appears normal (3                         well visualized                                vessel cord)  Lips:                  Appears normal         Kidneys:                Appear normal  Palate:                Not well visualized    Bladder:                Appears normal  Thoracic:              Appears normal         Spine:                  Limited views                                                                        appear normal  Heart:                 Abnormal, see          Upper Extremities:      Visualized                         comments  RVOT:                  Appears normal         Lower  Extremities:      Visualized  Other:  Fetus appears to be a female. Nasal bone visualized. 3VV and 3VTV          visualized. ---------------------------------------------------------------------- Doppler - Fetal Vessels  Umbilical Artery   S/D     %tile      RI    %tile      PI    %tile     PSV    ADFV    RDFV                                                     (cm/s)   3.96   > 97.5    0.75       97    1.27       97    34.24      No      No ---------------------------------------------------------------------- Cervix Uterus Adnexa  Cervix  Not visualized (advanced GA >24wks)  Uterus  No abnormality visualized.  Right Ovary  Within  normal limits.  Left Ovary  Within normal limits.  Cul De Sac  No free fluid seen.  Adnexa  No abnormality visualized ---------------------------------------------------------------------- Comments  MFM Consult Note  Patient Name: Michelle Greer  Patient MRN:   [id number]  Referring provider: No referring provider defined for this  encounter.  Reason for Consult: Fetal growth restriction, BPP 4/8,  elevated dopplers, fetal cardiomegaly  HPI: LASHONN BLASE is a 26 y.o. [id number] at 35w3dhere for  ultrasound and consultation.  RE fetal growth restriction: Severe fetal growth restriction was  seen on today's ultrasound.  This is the first ultrasound the  patient has had since 14 weeks 6 days.  The estimated fetal  weight is in the less than the 1st percentile with an estimation  of 1600 g on today's ultrasound.  The abdominal  circumference head circumference and femur length are all  less than the 1st percentile.  The other long bones are  between the first and 5th percentile. The chest to abdomen  circumference ratio was 0.92 (less than 0.9 as a predictor of  lethality and skeletal dysplasias) in the femur to AM[facility] Surgery Centerratio is  0.23 (less than 0.16 is a predictor of lethality for skeletal  dysplasias).  The heart is uniformly enlarged suggestive of  cardiomyopathy.  There is no pericardial effusion.  The   outflow tracts appear appropriately arranged.  The cardiac  axis appears normal.  While this was not an official fetal echo  the ventricles and atria appear to be contracting  appropriately.  There is also very little fluid in the fetus did not  show practice breathing.  I discussed the urgency of going to  the MAU promptly for admission.  I also discussed that there  is a high rate of stillbirth and she may need a urgent delivery  dependent upon the fetal tracing.  I also discussed that the  Dopplers were elevated today but without signs of reversed  flow.  The patient verbalized she understands the urgency of  the situation and will go directly to the MAU.  I have spoken  with Dr. ETrevor Macestat who is aware of the plan.  The NICU should  be notified promptly about the fetal size and cardiomegaly.  Review of Systems: A review of systems was performed and  was negative except per HPI  Vitals and Physical Exam  BP 130/65, HR 89, pregravid BMI: 22.  Emotionally disturbed after receiving news about her fetus  Nonlabored breathing  Normal rate and rhythm  Abdomen is nontender  Genetic testing: Normal  Sonographic findings:  Single intrauterine pregnancy.  Fetal cardiac activity:  Observed and appears normal.  Presentation: Cephalic.  Severe fetal growth restriction was seen on today's  ultrasound.  This is the first ultrasound the patient has had  since 14 weeks 6 days.  The estimated fetal weight is in the  less than the 1st percentile with an estimation of 1600 g on  today's ultrasound.  The abdominal circumference head  circumference and femur length are all less than the 1st  percentile.  The other long bones are between the first and  5th percentile. The chest to abdomen circumference ratio  was 0.92 (less than 0.9 as a predictor of lethality and skeletal  dysplasias) in the femur to [facility] is 0.23 (less than 0.16 is  a predictor of lethality for skeletal dysplasias).  The heart is  uniformly enlarged suggestive of  cardiomyopathy.  There is  no pericardial effusion.  The outflow tracts appear  appropriately arranged.  The cardiac axis appears normal.  While this was not an official fetal echo the ventricles and  atria appear to be contracting appropriately.  There is also  very little fluid in the fetus did not show practice breathing.  Fetal biometry shows the estimated fetal weight at the < 1st  percentile and the abdominal circumference at the <1st  percentile.  The long bones appear normally shaped with  normal mineralization without evidence of fracture.  Amniotic fluid volume: Oligohydramnios. AFI: 5.77 cm.  MVP:  3.17 cm. But no fluid pockets are >2 cm wide. This is  consistent with oligohydramnios.  Placenta: Posterior.  Umbilical artery dopplers findings:  -S/D:3.96 which are elevated at this gestational age.  -Absent end-diastolic flow: No.  -Reversed end-diastolic flow:  No.  BPP 4/8 (-2 breathing and fluid)  Assessment  -Fetal growth restriction, placental origin but skeletal  dysplasia must also be considered although it is less likely  the cause of the growth restriction given the normal-  appearing long bones  Plan  -Sent directly to the MAU for admission and consideration of  delivery.  -Timing of delivery will depend upon the fetal heart tracing.  If  the fetal heart tracing is not reassuring and urgent cesarean  delivery should be performed.  If the fetal heart tracing is  reactive, steroids for fetal lung maturity are recommended  due to the small chest to heart ratio and less than [redacted] week  gestation.  Continuous fetal monitoring should be done with a  very low threshold for delivery.  -Notify NICU and pediatric cardiology regarding the patient's  status upon admission given the EFW of 1600g with the fetal  cardiomegaly (unknown origin, possibly a component of the  FGR). If the fetal status allows a fetal echo would be helpful  for postnatal planning.  -I have called the on call provider to notify them about  her  arrival ----------------------------------------------------------------------                  [name], DO Electronically Signed Final Report   01/19/2023 05:20 pm ----------------------------------------------------------------------  [place] MFM FETAL BPP WO NON STRESS  Result Date: 01/19/2023 ----------------------------------------------------------------------  OBSTETRICS REPORT                       (Signed Final 01/19/2023 05:20 pm) ---------------------------------------------------------------------- Patient Info  ID #:       [id number]                          D.O.B.:  May 19, 1996 (26 yrs)  Name:       Michelle Greer                     Visit Date: 01/19/2023 02:51 pm ---------------------------------------------------------------------- Performed By  Attending:        [name] DO       Referred By:      Lyndel Safe                                                             NDULUE  Performed By:     Nevin Bloodgood          [facility]  [facility]                    RD[facility]                                     Fetal Care at                                                             [place] for                                                             Women ---------------------------------------------------------------------- Orders  #  Description                           Code        Ordered By  1  [place] MFM OB DETAIL +14 [place]               76811.01    [place]  2  [place] MFM FETAL BPP WO NON               76819.01    [place]  3  [place] MFM UA CORD DOPPLER                76820.02    CHIAGOZIEM NDULUE ----------------------------------------------------------------------  #  Order #                     Accession #                Episode #  1  [id number]                   [id number]                 [id number]  2  [id number]                   [id number]                 [id number]  3  [id number]                   [id number]                 [id number]  ---------------------------------------------------------------------- Indications  Late to prenatal care, third trimester         O09.33  Encounter for antenatal screening for          Z36.3  malformations  Sever fetal growth restriction, third trimesterO36.5930  Oligohydramnios / Decreased amniotic fluid     O41.00X0  volume  Abnormal fetal ultrasound - BPP 4/8            O28.9  [redacted] weeks gestation of pregnancy                Z3A.34  LR NIPS/Neg Horizon ---------------------------------------------------------------------- Fetal Evaluation  Num Of Fetuses:  1  Fetal Heart Rate(bpm):  146  Cardiac Activity:       Observed  Presentation:           Cephalic  Placenta:               Posterior  P. Cord Insertion:      Not well visualized  Amniotic Fluid  AFI FV:      Oligohydramnios  AFI Sum(cm)     %Tile       Largest Pocket(cm)  5.77            < 3         3.17  RUQ(cm)       RLQ(cm)       LUQ(cm)        LLQ(cm)  1.22          0             1.38           3.17  Comment:    No pocket >2 cm in diameter ---------------------------------------------------------------------- Biophysical Evaluation  Amniotic F.V:   Oligohydramnios            F. Tone:        Observed  F. Movement:    Observed                   Score:          4/8  F. Breathing:   Not Observed ---------------------------------------------------------------------- Biometry  BPD:      80.3  mm     G. Age:  32w 2d        4.2  %    CI:        79.68   %    70 - 86                                                          FL/HC:      20.9   %    19.4 - 21.8  HC:      284.3  mm     G. Age:  31w 1d        < 1  %    HC/AC:      1.10        0.96 - 1.11  AC:      257.8  mm     G. Age:  30w 0d        < 1  %    FL/BPD:     74.1   %    71 - 87  FL:       59.5  mm     G. Age:  31w 0d        < 1  %    FL/AC:      23.1   %    20 - 24  HUM:      54.1  mm     G. Age:  31w 3d        < 5  %  TC:    228.91   mm                            3.8  %   RIGHT  HUM:      52.8  mm      G. Age:  30w 5d        < 5  %  ULN:      45.2  mm     G. Age:  29w 0d        < 5  %  TIB:      52.9  mm     G. Age:  31w 2d        < 5  %  RAD:        45  mm     G. Age:  32w 0d         38  %  FIB:      54.1  mm     G. Age:  33w 1d         51  %  Foot:     68.3  mm     G. Age:  34w 1d         20  %   LEFT  HUM:      52.9  mm     G. Age:  30w 6d        < 5  %  ULN:      46.6  mm     G. Age:  29w 5d        < 5  %  TIB:      55.5  mm     G. Age:  32w 5d         28  %  RAD:      53.4  mm     G. Age:  N/A            87  %  FIB:        53  mm     G. Age:  32w 4d         52  %  Est. FW:    1600  gm      3 lb 8 oz    < 1  % ---------------------------------------------------------------------- OB History  Blood Type:   O+  Maternal Racial/Ethnic Group:   Black (non-Hispanic)  Gravidity:    4         Term:   2        Prem:   0        SAB:   1  TOP:          0       Ectopic:  0        Living: 2 ---------------------------------------------------------------------- Gestational Age  LMP:           38w 3d        Date:  04/25/22                   EDD:   01/30/23  U/S Today:     31w 1d                                        EDD:   03/22/23  Best:          34w 3d     Det. By:  Previous Ultrasound      EDD:   02/27/23                                      (  09/04/22) ---------------------------------------------------------------------- Anatomy  Cranium:               Appears normal         LVOT:                   Appears normal  Cavum:                 Appears normal         Aortic Arch:            Appears normal  Ventricles:            Not well visualized    Ductal Arch:            Appears normal  Choroid Plexus:        Not well visualized    Diaphragm:              Appears normal  Cerebellum:            Not well visualized    Stomach:                Appears normal, left                                                                        sided  Posterior Fossa:       Not well visualized    Abdomen:                Appears normal  Nuchal  Fold:           Not applicable (Q000111Q    Abdominal Wall:         Not well visualized                         wks GA)  Face:                  Orbits nl; profile not Cord Vessels:           Appears normal (3                         well visualized                                vessel cord)  Lips:                  Appears normal         Kidneys:                Appear normal  Palate:                Not well visualized    Bladder:                Appears normal  Thoracic:              Appears normal         Spine:                  Limited views  appear normal  Heart:                 Abnormal, see          Upper Extremities:      Visualized                         comments  RVOT:                  Appears normal         Lower Extremities:      Visualized  Other:  Fetus appears to be a female. Nasal bone visualized. 3VV and 3VTV          visualized. ---------------------------------------------------------------------- Doppler - Fetal Vessels  Umbilical Artery   S/D     %tile      RI    %tile      PI    %tile     PSV    ADFV    RDFV                                                     (cm/s)   3.96   > 97.5    0.75       97    1.27       97    34.24      No      No ---------------------------------------------------------------------- Cervix Uterus Adnexa  Cervix  Not visualized (advanced GA >24wks)  Uterus  No abnormality visualized.  Right Ovary  Within normal limits.  Left Ovary  Within normal limits.  Cul De Sac  No free fluid seen.  Adnexa  No abnormality visualized ---------------------------------------------------------------------- Comments  MFM Consult Note  Patient Name: Michelle Greer  Patient MRN:   [id number]  Referring provider: No referring provider defined for this  encounter.  Reason for Consult: Fetal growth restriction, BPP 4/8,  elevated dopplers, fetal cardiomegaly  HPI: Michelle Greer is a 27 y.o. [id number] at 49w3dhere for  ultrasound and  consultation.  RE fetal growth restriction: Severe fetal growth restriction was  seen on today's ultrasound.  This is the first ultrasound the  patient has had since 14 weeks 6 days.  The estimated fetal  weight is in the less than the 1st percentile with an estimation  of 1600 g on today's ultrasound.  The abdominal  circumference head circumference and femur length are all  less than the 1st percentile.  The other long bones are  between the first and 5th percentile. The chest to abdomen  circumference ratio was 0.92 (less than 0.9 as a predictor of  lethality and skeletal dysplasias) in the femur to [facility] Cent[facility]atio is  0.23 (less than 0.16 is a predictor of lethality for skeletal  dysplasias).  The heart is uniformly enlarged suggestive of  cardiomyopathy.  There is no pericardial effusion.  The  outflow tracts appear appropriately arranged.  The cardiac  axis appears normal.  While this was not an official fetal echo  the ventricles and atria appear to be contracting  appropriately.  There is also very little fluid in the fetus did not  show practice breathing.  I discussed the urgency of going to  the MAU promptly for admission.  I also discussed that there  is a high rate  of stillbirth and she may need a urgent delivery  dependent upon the fetal tracing.  I also discussed that the  Dopplers were elevated today but without signs of reversed  flow.  The patient verbalized she understands the urgency of  the situation and will go directly to the MAU.  I have spoken  with Dr. Trevor Mace stat who is aware of the plan.  The NICU should  be notified promptly about the fetal size and cardiomegaly.  Review of Systems: A review of systems was performed and  was negative except per HPI  Vitals and Physical Exam  BP 130/65, HR 89, pregravid BMI: 22.  Emotionally disturbed after receiving news about her fetus  Nonlabored breathing  Normal rate and rhythm  Abdomen is nontender  Genetic testing: Normal  Sonographic findings:  Single  intrauterine pregnancy.  Fetal cardiac activity:  Observed and appears normal.  Presentation: Cephalic.  Severe fetal growth restriction was seen on today's  ultrasound.  This is the first ultrasound the patient has had  since 14 weeks 6 days.  The estimated fetal weight is in the  less than the 1st percentile with an estimation of 1600 g on  today's ultrasound.  The abdominal circumference head  circumference and femur length are all less than the 1st  percentile.  The other long bones are between the first and  5th percentile. The chest to abdomen circumference ratio  was 0.92 (less than 0.9 as a predictor of lethality and skeletal  dysplasias) in the femur to [facility] ratio is 0.23 (less than 0.16 is  a predictor of lethality for skeletal dysplasias).  The heart is  uniformly enlarged suggestive of cardiomyopathy.  There is  no pericardial effusion.  The outflow tracts appear  appropriately arranged.  The cardiac axis appears normal.  While this was not an official fetal echo the ventricles and  atria appear to be contracting appropriately.  There is also  very little fluid in the fetus did not show practice breathing.  Fetal biometry shows the estimated fetal weight at the < 1st  percentile and the abdominal circumference at the <1st  percentile.  The long bones appear normally shaped with  normal mineralization without evidence of fracture.  Amniotic fluid volume: Oligohydramnios. AFI: 5.77 cm.  MVP:  3.17 cm. But no fluid pockets are >2 cm wide. This is  consistent with oligohydramnios.  Placenta: Posterior.  Umbilical artery dopplers findings:  -S/D:3.96 which are elevated at this gestational age.  -Absent end-diastolic flow: No.  -Reversed end-diastolic flow:  No.  BPP 4/8 (-2 breathing and fluid)  Assessment  -Fetal growth restriction, placental origin but skeletal  dysplasia must also be considered although it is less likely  the cause of the growth restriction given the normal-  appearing long bones  Plan   -Sent directly to the MAU for admission and consideration of  delivery.  -Timing of delivery will depend upon the fetal heart tracing.  If  the fetal heart tracing is not reassuring and urgent cesarean  delivery should be performed.  If the fetal heart tracing is  reactive, steroids for fetal lung maturity are recommended  due to the small chest to heart ratio and less than [redacted] week  gestation.  Continuous fetal monitoring should be done with a  very low threshold for delivery.  -Notify NICU and pediatric cardiology regarding the patient's  status upon admission given the EFW of 1600g with the fetal  cardiomegaly (unknown origin, possibly a component  of the  Time). If the fetal status allows a fetal echo would be helpful  for postnatal planning.  -I have called the on call provider to notify them about her  arrival ----------------------------------------------------------------------                  [name], DO Electronically Signed Final Report   01/19/2023 05:20 pm ----------------------------------------------------------------------  [place] MFM UA CORD DOPPLER  Result Date: 01/19/2023 ----------------------------------------------------------------------  OBSTETRICS REPORT                       (Signed Final 01/19/2023 05:20 pm) ---------------------------------------------------------------------- Patient Info  ID #:       [id number]                          D.O.B.:  19-Jun-1996 (26 yrs)  Name:       Michelle Greer                     Visit Date: 01/19/2023 02:51 pm ---------------------------------------------------------------------- Performed By  Attending:        [name] DO       Referred By:      [place]  Performed By:     Nevin Bloodgood          [facility]                                                             [place] [facility]                                                              [facility] ---------------------------------------------------------------------- Orders  #  Description                           Code        Ordered By  1  [place] MFM OB DETAIL +14 [place]               76811.01    [place]  2  [place] MFM FETAL BPP WO NON               [id number]    [place]  3  [place] MFM UA CORD  DOPPLER                [id number]    CHIAGOZIEM NDULUE ----------------------------------------------------------------------  #  Order #                     Accession #                Episode #  1  [id number]                   [id number]                 [id number]  2  [id number]                   [id number]                 [id number]  3  [id number]                   [id number]                 [id number] ---------------------------------------------------------------------- Indications  Late to prenatal care, third trimester         O09.33  Encounter for antenatal screening for          Z36.3  malformations  Sever fetal growth restriction, third trimesterO36.5930  Oligohydramnios / Decreased amniotic fluid     O41.00X0  volume  Abnormal fetal ultrasound - BPP 4/8            O28.9  [redacted] weeks gestation of pregnancy                Z3A.34  LR NIPS/Neg [place] ---------------------------------------------------------------------- Fetal Evaluation  Num Of Fetuses:         1  Fetal Heart Rate(bpm):  146  Cardiac Activity:       Observed  Presentation:           Cephalic  Placenta:               Posterior  P. Cord Insertion:      Not well visualized  Amniotic Fluid  AFI FV:      Oligohydramnios  AFI Sum(cm)     %Tile       Largest Pocket(cm)  5.77            < 3         3.17  RUQ(cm)       RLQ(cm)       LUQ(cm)        LLQ(cm)  1.22          0             1.38           3.17  Comment:    No pocket >2 cm in diameter ---------------------------------------------------------------------- Biophysical Evaluation  Amniotic F.V:   Oligohydramnios            F. Tone:         Observed  F. Movement:    Observed                   Score:          4/8  F. Breathing:   Not Observed ---------------------------------------------------------------------- Biometry  BPD:      80.3  mm     G. Age:  32w 2d        4.2  %    CI:  79.68   %    70 - 86                                                          FL/HC:      20.9   %    19.4 - 21.8  HC:      284.3  mm     G. Age:  31w 1d        < 1  %    HC/AC:      1.10        0.96 - 1.11  AC:      257.8  mm     G. Age:  30w 0d        < 1  %    FL/BPD:     74.1   %    71 - 87  FL:       59.5  mm     G. Age:  31w 0d        < 1  %    FL/AC:      23.1   %    20 - 24  HUM:      54.1  mm     G. Age:  31w 3d        < 5  %  TC:    228.91   mm                            3.8  %   RIGHT  HUM:      52.8  mm     G. Age:  30w 5d        < 5  %  ULN:      45.2  mm     G. Age:  29w 0d        < 5  %  TIB:      52.9  mm     G. Age:  31w 2d        < 5  %  RAD:        45  mm     G. Age:  32w 0d         38  %  FIB:      54.1  mm     G. Age:  33w 1d         60  %  Foot:     68.3  mm     G. Age:  34w 1d         20  %   LEFT  HUM:      52.9  mm     G. Age:  30w 6d        < 5  %  ULN:      46.6  mm     G. Age:  29w 5d        < 5  %  TIB:      55.5  mm     G. Age:  32w 5d         28  %  RAD:      53.4  mm     G. Age:  N/A  87  %  FIB:        53  mm     G. Age:  32w 4d         52  %  Est. FW:    1600  gm      3 lb 8 oz    < 1  % ---------------------------------------------------------------------- OB History  Blood Type:   O+  Maternal Racial/Ethnic Group:   Black (non-Hispanic)  Gravidity:    4         Term:   2        Prem:   0        SAB:   1  TOP:          0       Ectopic:  0        Living: 2 ---------------------------------------------------------------------- Gestational Age  LMP:           38w 3d        Date:  04/25/22                   EDD:   01/30/23  U/S Today:     31w 1d                                        EDD:   03/22/23  Best:          34w 3d     Det.  By:  Previous Ultrasound      EDD:   02/27/23                                      (09/04/22) ---------------------------------------------------------------------- Anatomy  Cranium:               Appears normal         LVOT:                   Appears normal  Cavum:                 Appears normal         Aortic Arch:            Appears normal  Ventricles:            Not well visualized    Ductal Arch:            Appears normal  Choroid Plexus:        Not well visualized    Diaphragm:              Appears normal  Cerebellum:            Not well visualized    Stomach:                Appears normal, left                                                                        sided  Posterior Fossa:       Not well visualized  Abdomen:                Appears normal  Nuchal Fold:           Not applicable ([id number]    Abdominal Wall:         Not well visualized                         wks GA)  Face:                  Orbits nl; profile not Cord Vessels:           Appears normal (3                         well visualized                                vessel cord)  Lips:                  Appears normal         Kidneys:                Appear normal  Palate:                Not well visualized    Bladder:                Appears normal  Thoracic:              Appears normal         Spine:                  Limited views                                                                        appear normal  Heart:                 Abnormal, see          Upper Extremities:      Visualized                         comments  RVOT:                  Appears normal         Lower Extremities:      Visualized  Other:  Fetus appears to be a female. Nasal bone visualized. 3VV and 3VTV          visualized. ---------------------------------------------------------------------- Doppler - Fetal Vessels  Umbilical Artery   S/D     %tile      RI    %tile      PI    %tile     PSV    ADFV    RDFV                                                     (  cm/s)    3.96   > 97.5    0.75       97    1.27       97    34.24      No      No ---------------------------------------------------------------------- Cervix Uterus Adnexa  Cervix  Not visualized (advanced GA >24wks)  Uterus  No abnormality visualized.  Right Ovary  Within normal limits.  Left Ovary  Within normal limits.  Cul De Sac  No free fluid seen.  Adnexa  No abnormality visualized ---------------------------------------------------------------------- Comments  MFM Consult Note  Patient Name: Michelle Greer  Patient MRN:   [id number]  Referring provider: No referring provider defined for this  encounter.  Reason for Consult: Fetal growth restriction, BPP 4/8,  elevated dopplers, fetal cardiomegaly  HPI: Michelle Greer is a 27 y.o. [id number] at 77w3dhere for  ultrasound and consultation.  RE fetal growth restriction: Severe fetal growth restriction was  seen on today's ultrasound.  This is the first ultrasound the  patient has had since 14 weeks 6 days.  The estimated fetal  weight is in the less than the 1st percentile with an estimation  of 1600 g on today's ultrasound.  The abdominal  circumference head circumference and femur length are all  less than the 1st percentile.  The other long bones are  between the first and 5th percentile. The chest to abdomen  circumference ratio was 0.92 (less than 0.9 as a predictor of  lethality and skeletal dysplasias) in the femur to [facility] is  0.23 (less than 0.16 is a predictor of lethality for skeletal  dysplasias).  The heart is uniformly enlarged suggestive of  cardiomyopathy.  There is no pericardial effusion.  The  outflow tracts appear appropriately arranged.  The cardiac  axis appears normal.  While this was not an official fetal echo  the ventricles and atria appear to be contracting  appropriately.  There is also very little fluid in the fetus did not  show practice breathing.  I discussed the urgency of going to  the MAU promptly for admission.  I also discussed that there   is a high rate of stillbirth and she may need a urgent delivery  dependent upon the fetal tracing.  I also discussed that the  Dopplers were elevated today but without signs of reversed  flow.  The patient verbalized she understands the urgency of  the situation and will go directly to the MAU.  I have spoken  with Dr. ETrevor Macestat who is aware of the plan.  The NICU should  be notified promptly about the fetal size and cardiomegaly.  Review of Systems: A review of systems was performed and  was negative except per HPI  Vitals and Physical Exam  BP 130/65, HR 89, pregravid BMI: 22.  Emotionally disturbed after receiving news about her fetus  Nonlabored breathing  Normal rate and rhythm  Abdomen is nontender  Genetic testing: Normal  Sonographic findings:  Single intrauterine pregnancy.  Fetal cardiac activity:  Observed and appears normal.  Presentation: Cephalic.  Severe fetal growth restriction was seen on today's  ultrasound.  This is the first ultrasound the patient has had  since 14 weeks 6 days.  The estimated fetal weight is in the  less than the 1st percentile with an estimation of 1600 g on  today's ultrasound.  The abdominal circumference head  circumference and femur length are all less than the 1st  percentile.  The  other long bones are between the first and  5th percentile. The chest to abdomen circumference ratio  was 0.92 (less than 0.9 as a predictor of lethality and skeletal  dysplasias) in the femur to [facility] ratio is 0.23 (less than 0.16 is  a predictor of lethality for skeletal dysplasias).  The heart is  uniformly enlarged suggestive of cardiomyopathy.  There is  no pericardial effusion.  The outflow tracts appear  appropriately arranged.  The cardiac axis appears normal.  While this was not an official fetal echo the ventricles and  atria appear to be contracting appropriately.  There is also  very little fluid in the fetus did not show practice breathing.  Fetal biometry shows the estimated fetal  weight at the < 1st  percentile and the abdominal circumference at the <1st  percentile.  The long bones appear normally shaped with  normal mineralization without evidence of fracture.  Amniotic fluid volume: Oligohydramnios. AFI: 5.77 cm.  MVP:  3.17 cm. But no fluid pockets are >2 cm wide. This is  consistent with oligohydramnios.  Placenta: Posterior.  Umbilical artery dopplers findings:  -S/D:3.96 which are elevated at this gestational age.  -Absent end-diastolic flow: No.  -Reversed end-diastolic flow:  No.  BPP 4/8 (-2 breathing and fluid)  Assessment  -Fetal growth restriction, placental origin but skeletal  dysplasia must also be considered although it is less likely  the cause of the growth restriction given the normal-  appearing long bones  Plan  -Sent directly to the MAU for admission and consideration of  delivery.  -Timing of delivery will depend upon the fetal heart tracing.  If  the fetal heart tracing is not reassuring and urgent cesarean  delivery should be performed.  If the fetal heart tracing is  reactive, steroids for fetal lung maturity are recommended  due to the small chest to heart ratio and less than [redacted] week  gestation.  Continuous fetal monitoring should be done with a  very low threshold for delivery.  -Notify NICU and pediatric cardiology regarding the patient's  status upon admission given the EFW of 1600g with the fetal  cardiomegaly (unknown origin, possibly a component of the  FGR). If the fetal status allows a fetal echo would be helpful  for postnatal planning.  -I have called the on call provider to notify them about her  arrival ----------------------------------------------------------------------                  [name], DO Electronically Signed Final Report   01/19/2023 05:20 pm ----------------------------------------------------------------------   Current scheduled medications  betamethasone acetate-betamethasone sodium phosphate  12 mg Intramuscular Q24H    docusate sodium  100 mg Oral Daily   prenatal multivitamin  1 tablet Oral Q1200   sodium chloride flush  3 mL Intravenous Q12H    I have reviewed the patient's current medications.  ASSESSMENT: 26yo 02/2013 at 49/4 with newly diagnosed FGR <1% with elevated UAD, oligohydramnios and cardiomegaly Principal Problem:   Oligohydramnios antepartum Active Problems:   Fetal growth restriction antepartum   Abnormal fetal ultrasound  PLAN: S/p BMZ #1  01/19/23 at 1858. 2nd dose ordered S/p MFM consult - continuous EFM until delivery. Plan to start IOL tonight after 2nd dose of BMZ; ust recently had late deceleration, but has reassuring features with moderate variability and accels. Reasonable for trial of labor at this time.  S/p NICU consult  Gale Journey, [place], [facility], [facility]

## 2023-01-20 NOTE — Consult Note (Signed)
[facility]  Prenatal Consult      01/20/2023   11:20 AM  I was asked by Dr. Elgie Congo to consult on this patient for possible preterm delivery. I had the pleasure of meeting with Michelle Greer and her family today.   She is a 27 yo G1P2012 female presenting at 52w4dIUP with concerns for severe IUGR, oligohydramnios, elevated UA dopplers, BPP 4/8, and cardiomegaly of unclear etiology. No pericardial effusion or other concern for hydrops. Low concern for skeletal dysplasia. Cardiac function appeared normal on ultrasound yesterday, and Peds Cardiology advised it appeared appropriate to deliver here with plan for postnatal echo. No other significant maternal medical history. She has received BMZ x1 (last dose 2/28 at 1858; second dose scheduled for this evening). Most recent estimated fetal weight of 1600 grams on 2/28. They are having a boy (not yet revealing his name). Planned for induction this evening following BMZ dose.  We discussed the possible needs for an infant born at this gestation, along with the complications that may come with severe growth restriction and oligohydramnios. I explained that the neonatal intensive care team would be present for the delivery and outlined the likely delivery room course for this baby including routine resuscitation and NRP-guided approaches to the treatment of respiratory distress. We discussed the potential need for CPAP or mechanical ventilation for respiratory distress, IV fluids pending establishment of enteral feeds, temperature support, and monitoring.   We discussed other common problems associated with prematurity including respiratory distress syndrome/CLD, feeding issues, temperature regulation, and infection risk. I also discussed the planned evaluation of his cardiomegaly, including postnatal echo and possibly further testing based on results and/or other clinical findings, as well as potential complications and need for  cardiovascular support.  We discussed the importance of good nutrition and various methods of providing nutrition (parenteral hyperalimentation, gavage feedings and/or oral feeding). We discussed the benefits of human milk. I encouraged breast feeding and pumping soon after birth and outlined resources that are available to support breast feeding.  We discussed the average length of stay but I noted that the actual LOS would depend on the severity of problems encountered and response to treatments. We discussed visitation policies and the resources available while their child is in the hospital.  They expressed understanding and agreement with the plan for resuscitation and intensive care. All questions were answered.  Thank you for involving [facility] the care of this patient. A member of our team will be available should the family have additional questions.   SClarnce Flock MD Neonatal Medicine  I spent ~45 minutes in consultation time, of which 30 minutes was spent in direct face to face counseling.

## 2023-01-21 ENCOUNTER — Encounter (HOSPITAL_COMMUNITY): Payer: Self-pay | Admitting: Obstetrics and Gynecology

## 2023-01-21 ENCOUNTER — Inpatient Hospital Stay (HOSPITAL_COMMUNITY): Payer: Medicaid Other | Admitting: Anesthesiology

## 2023-01-21 DIAGNOSIS — O365930 Maternal care for other known or suspected poor fetal growth, third trimester, not applicable or unspecified: Secondary | ICD-10-CM

## 2023-01-21 DIAGNOSIS — O4103X0 Oligohydramnios, third trimester, not applicable or unspecified: Secondary | ICD-10-CM

## 2023-01-21 DIAGNOSIS — Z3A34 34 weeks gestation of pregnancy: Secondary | ICD-10-CM

## 2023-01-21 LAB — CBC
HCT: 27.9 % — ABNORMAL LOW (ref 36.0–46.0)
Hemoglobin: 8.8 g/dL — ABNORMAL LOW (ref 12.0–15.0)
MCH: 26 pg (ref 26.0–34.0)
MCHC: 31.5 g/dL (ref 30.0–36.0)
MCV: 82.5 fL (ref 80.0–100.0)
Platelets: 313 10*3/uL (ref 150–400)
RBC: 3.38 MIL/uL — ABNORMAL LOW (ref 3.87–5.11)
RDW: 14.4 % (ref 11.5–15.5)
WBC: 15.5 10*3/uL — ABNORMAL HIGH (ref 4.0–10.5)
nRBC: 0.3 % — ABNORMAL HIGH (ref 0.0–0.2)

## 2023-01-21 MED ORDER — SODIUM CHLORIDE 0.9% FLUSH: 3.0000 mL | INTRAVENOUS | Status: DC | PRN

## 2023-01-21 MED ORDER — ACETAMINOPHEN 325 MG PO TABS: 650.0000 mg | ORAL_TABLET | ORAL | Status: DC | PRN

## 2023-01-21 MED ORDER — OXYCODONE-ACETAMINOPHEN 5-325 MG PO TABS: 1.0000 | ORAL_TABLET | ORAL | Status: DC | PRN

## 2023-01-21 MED ORDER — OXYCODONE-ACETAMINOPHEN 5-325 MG PO TABS: 2.0000 | ORAL_TABLET | ORAL | Status: DC | PRN

## 2023-01-21 MED ORDER — IBUPROFEN 600 MG PO TABS
600.0000 mg | ORAL_TABLET | Freq: Four times a day (QID) | ORAL | Status: DC
  Administered 2023-01-21 – 2023-01-23 (×7): 600 mg via ORAL
  Filled 2023-01-21 (×7): qty 1

## 2023-01-21 MED ORDER — FENTANYL-BUPIVACAINE-NACL 0.5-0.125-0.9 MG/250ML-% EP SOLN
12.0000 mL/h | EPIDURAL | Status: DC | PRN
  Filled 2023-01-21: qty 250

## 2023-01-21 MED ORDER — TRANEXAMIC ACID-NACL 1000-0.7 MG/100ML-% IV SOLN: 1000.0000 mg | INTRAVENOUS | Status: DC

## 2023-01-21 MED ORDER — OXYTOCIN BOLUS FROM INFUSION
333.0000 mL | Freq: Once | INTRAVENOUS | Status: AC
  Administered 2023-01-21: 333 mL via INTRAVENOUS

## 2023-01-21 MED ORDER — LIDOCAINE HCL (PF) 1 % IJ SOLN
INTRAMUSCULAR | Status: DC | PRN
  Administered 2023-01-21: 10 mL via EPIDURAL
  Administered 2023-01-21: 2 mL via EPIDURAL

## 2023-01-21 MED ORDER — OXYTOCIN-SODIUM CHLORIDE 30-0.9 UT/500ML-% IV SOLN
1.0000 m[IU]/min | INTRAVENOUS | Status: DC
  Filled 2023-01-21: qty 500

## 2023-01-21 MED ORDER — SODIUM CHLORIDE 0.9% FLUSH
3.0000 mL | Freq: Two times a day (BID) | INTRAVENOUS | Status: DC
  Administered 2023-01-22: 3 mL via INTRAVENOUS

## 2023-01-21 MED ORDER — SODIUM CHLORIDE 0.9 % IV SOLN
250.0000 mL | INTRAVENOUS | Status: DC | PRN
  Administered 2023-01-22: 40 mL via INTRAVENOUS

## 2023-01-21 MED ORDER — WITCH HAZEL-GLYCERIN EX PADS: 1.0000 | MEDICATED_PAD | CUTANEOUS | Status: DC | PRN

## 2023-01-21 MED ORDER — SOD CITRATE-CITRIC ACID 500-334 MG/5ML PO SOLN: 30.0000 mL | ORAL | Status: DC | PRN

## 2023-01-21 MED ORDER — ONDANSETRON HCL 4 MG PO TABS: 4.0000 mg | ORAL_TABLET | ORAL | Status: DC | PRN

## 2023-01-21 MED ORDER — ONDANSETRON HCL 4 MG/2ML IJ SOLN: 4.0000 mg | Freq: Four times a day (QID) | INTRAMUSCULAR | Status: DC | PRN

## 2023-01-21 MED ORDER — ACETAMINOPHEN 325 MG PO TABS
650.0000 mg | ORAL_TABLET | ORAL | Status: DC | PRN
  Administered 2023-01-21 – 2023-01-22 (×2): 650 mg via ORAL
  Filled 2023-01-21 (×2): qty 2

## 2023-01-21 MED ORDER — FENTANYL-BUPIVACAINE-NACL 0.5-0.125-0.9 MG/250ML-% EP SOLN
EPIDURAL | Status: DC | PRN
  Administered 2023-01-21: 12 mL/h via EPIDURAL

## 2023-01-21 MED ORDER — LACTATED RINGERS IV SOLN: 500.0000 mL | INTRAVENOUS | Status: DC | PRN

## 2023-01-21 MED ORDER — METHYLERGONOVINE MALEATE 0.2 MG/ML IJ SOLN
INTRAMUSCULAR | Status: AC
  Administered 2023-01-21: 0.2 mg
  Filled 2023-01-21: qty 1

## 2023-01-21 MED ORDER — SODIUM CHLORIDE 0.9 % IV SOLN
5.0000 10*6.[IU] | Freq: Once | INTRAVENOUS | Status: AC
  Administered 2023-01-21: 5 10*6.[IU] via INTRAVENOUS
  Filled 2023-01-21: qty 5

## 2023-01-21 MED ORDER — SIMETHICONE 80 MG PO CHEW: 80.0000 mg | CHEWABLE_TABLET | ORAL | Status: DC | PRN

## 2023-01-21 MED ORDER — DIPHENHYDRAMINE HCL 50 MG/ML IJ SOLN
12.5000 mg | INTRAMUSCULAR | Status: DC | PRN
  Administered 2023-01-21: 12.5 mg via INTRAVENOUS
  Filled 2023-01-21: qty 1

## 2023-01-21 MED ORDER — TRANEXAMIC ACID-NACL 1000-0.7 MG/100ML-% IV SOLN
INTRAVENOUS | Status: AC
  Administered 2023-01-21: 1000 mg
  Filled 2023-01-21: qty 100

## 2023-01-21 MED ORDER — PRENATAL MULTIVITAMIN CH
1.0000 | ORAL_TABLET | Freq: Every day | ORAL | Status: DC
  Administered 2023-01-22 – 2023-01-23 (×2): 1 via ORAL
  Filled 2023-01-21 (×2): qty 1

## 2023-01-21 MED ORDER — PHENYLEPHRINE 80 MCG/ML (10ML) SYRINGE FOR IV PUSH (FOR BLOOD PRESSURE SUPPORT): 80.0000 ug | PREFILLED_SYRINGE | INTRAVENOUS | Status: DC | PRN

## 2023-01-21 MED ORDER — ZOLPIDEM TARTRATE 5 MG PO TABS: 5.0000 mg | ORAL_TABLET | Freq: Every evening | ORAL | Status: DC | PRN

## 2023-01-21 MED ORDER — EPHEDRINE 5 MG/ML INJ: 10.0000 mg | INTRAVENOUS | Status: DC | PRN

## 2023-01-21 MED ORDER — TETANUS-DIPHTH-ACELL PERTUSSIS 5-2.5-18.5 LF-MCG/0.5 IM SUSY: 0.5000 mL | PREFILLED_SYRINGE | Freq: Once | INTRAMUSCULAR | Status: DC

## 2023-01-21 MED ORDER — COCONUT OIL OIL: 1.0000 | TOPICAL_OIL | Status: DC | PRN

## 2023-01-21 MED ORDER — ONDANSETRON HCL 4 MG/2ML IJ SOLN: 4.0000 mg | INTRAMUSCULAR | Status: DC | PRN

## 2023-01-21 MED ORDER — SERTRALINE HCL 50 MG PO TABS
50.0000 mg | ORAL_TABLET | Freq: Every day | ORAL | Status: DC
  Administered 2023-01-21 – 2023-01-23 (×3): 50 mg via ORAL
  Filled 2023-01-21 (×4): qty 1

## 2023-01-21 MED ORDER — BENZOCAINE-MENTHOL 20-0.5 % EX AERO
1.0000 | INHALATION_SPRAY | CUTANEOUS | Status: DC | PRN
  Administered 2023-01-23: 1 via TOPICAL
  Filled 2023-01-21: qty 56

## 2023-01-21 MED ORDER — DIBUCAINE (PERIANAL) 1 % EX OINT: 1.0000 | TOPICAL_OINTMENT | CUTANEOUS | Status: DC | PRN

## 2023-01-21 MED ORDER — OXYTOCIN-SODIUM CHLORIDE 30-0.9 UT/500ML-% IV SOLN
1.0000 m[IU]/min | INTRAVENOUS | Status: DC
  Administered 2023-01-21: 1 m[IU]/min via INTRAVENOUS

## 2023-01-21 MED ORDER — OXYTOCIN-SODIUM CHLORIDE 30-0.9 UT/500ML-% IV SOLN: 2.5000 [IU]/h | INTRAVENOUS | Status: DC

## 2023-01-21 MED ORDER — DIPHENHYDRAMINE HCL 25 MG PO CAPS: 25.0000 mg | ORAL_CAPSULE | Freq: Four times a day (QID) | ORAL | Status: DC | PRN

## 2023-01-21 MED ORDER — SENNOSIDES-DOCUSATE SODIUM 8.6-50 MG PO TABS
2.0000 | ORAL_TABLET | ORAL | Status: DC
  Administered 2023-01-22 – 2023-01-23 (×2): 2 via ORAL
  Filled 2023-01-21 (×2): qty 2

## 2023-01-21 MED ORDER — LACTATED RINGERS IV SOLN: 500.0000 mL | Freq: Once | INTRAVENOUS | Status: DC

## 2023-01-21 MED ORDER — TERBUTALINE SULFATE 1 MG/ML IJ SOLN: 0.2500 mg | Freq: Once | INTRAMUSCULAR | Status: DC | PRN

## 2023-01-21 MED ORDER — LACTATED RINGERS IV SOLN: INTRAVENOUS | Status: DC

## 2023-01-21 MED ORDER — PENICILLIN G POT IN DEXTROSE 60000 UNIT/ML IV SOLN
3.0000 10*6.[IU] | INTRAVENOUS | Status: DC
  Administered 2023-01-21: 3 10*6.[IU] via INTRAVENOUS
  Filled 2023-01-21: qty 50

## 2023-01-21 MED ORDER — ALBUTEROL SULFATE (2.5 MG/3ML) 0.083% IN NEBU: 3.0000 mL | INHALATION_SOLUTION | Freq: Four times a day (QID) | RESPIRATORY_TRACT | Status: DC | PRN

## 2023-01-21 MED ORDER — METHYLERGONOVINE MALEATE 0.2 MG/ML IJ SOLN: 0.2000 mg | Freq: Once | INTRAMUSCULAR | Status: DC

## 2023-01-21 MED ORDER — LIDOCAINE HCL (PF) 1 % IJ SOLN: 30.0000 mL | INTRAMUSCULAR | Status: DC | PRN

## 2023-01-21 NOTE — Progress Notes (Signed)
LABOR PROGRESS NOTE  Michelle Greer is a 27 y.o. [id number] at [redacted]w[redacted]d admitted for IOL for IUGR <1%, with elevated UAD, oligohydramnios.  Subjective: Doing well, no concerns right now. Will like to have AROM, as FOB is here now. She has an epidural and is comfortable.  Objective: BP 129/71   Pulse 87   Temp 98.3 F (36.8 C) (Axillary)   Resp 16   Ht '5\' 6"'$  (1.676 m)   Wt 79.3 kg   LMP 04/25/2022   SpO2 96%   BMI 28.23 kg/m  or  Vitals:   01/21/23 0816 01/21/23 0821 01/21/23 0831 01/21/23 0901  BP: 132/73 131/64 127/67 129/71  Pulse: 99 (!) 108 93 87  Resp: '16 14 16 16  '$ Temp:      TempSrc:      SpO2:      Weight:      Height:       Cervix: 6.5 cm / 80%/ station -1.  EFM: 130bpm, moderate variability, +accels, no decels  Contractions; every 2-5 mins.   Labs: Lab Results  Component Value Date   WBC 15.5 (H) 01/21/2023   HGB 8.8 (L) 01/21/2023   HCT 27.9 (L) 01/21/2023   MCV 82.5 01/21/2023   PLT 313 01/21/2023    Patient Active Problem List   Diagnosis Date Noted   Fetal growth restriction antepartum 01/20/2023   Abnormal fetal ultrasound 01/20/2023   Oligohydramnios antepartum 01/19/2023   Anemia of pregnancy 12/02/2022   Supervision of high risk pregnancy, antepartum 11/17/2022   History of postpartum hypertension 05/24/2018    Assessment / Plan: 27y.o. [id number] for IOL for IUGR, oligohydramnios, fetal cardiomegaly.   Labor: making progress, now in active labor. S/p FB, pit on 3 units. AROM performed with clear fluid Fetal Wellbeing:  Cat 1 Pain Control:  per patient request  Anticipated MOD:  vaginal  ID: Preterm, GBS unknown - PCN, has had adequate prophylaxis  [name], [place]/11/2022

## 2023-01-21 NOTE — Anesthesia Preprocedure Evaluation (Addendum)
Anesthesia Evaluation  Patient identified by MRN, date of birth, ID band Patient awake    Reviewed: Allergy & Precautions, Patient's Chart, lab work & pertinent test results  History of Anesthesia Complications Negative for: history of anesthetic complications  Airway Mallampati: II  TM Distance: >3 FB Neck ROM: Full    Dental no notable dental hx.    Pulmonary neg pulmonary ROS   Pulmonary exam normal breath sounds clear to auscultation       Cardiovascular negative cardio ROS Normal cardiovascular exam Rhythm:Regular Rate:Normal     Neuro/Psych  PSYCHIATRIC DISORDERS  Depression    negative neurological ROS     GI/Hepatic negative GI ROS, Neg liver ROS,,,  Endo/Other  negative endocrine ROS    Renal/GU negative Renal ROS  negative genitourinary   Musculoskeletal negative musculoskeletal ROS (+)    Abdominal   Peds  Hematology  (+) Blood dyscrasia, anemia Hb 8.8, plt 313   Anesthesia Other Findings   Reproductive/Obstetrics (+) Pregnancy Hb 8.8, hx PPH with last delivery                               Anesthesia Physical Anesthesia Plan  ASA: 2  Anesthesia Plan: Epidural   Post-op Pain Management:    Induction:   PONV Risk Score and Plan: 2  Airway Management Planned: Natural Airway  Additional Equipment: None  Intra-op Plan:   Post-operative Plan:   Informed Consent: I have reviewed the patients History and Physical, chart, labs and discussed the procedure including the risks, benefits and alternatives for the proposed anesthesia with the patient or authorized representative who has indicated his/her understanding and acceptance.       Plan Discussed with:   Anesthesia Plan Comments:         Anesthesia Quick Evaluation

## 2023-01-21 NOTE — Progress Notes (Signed)
LABOR PROGRESS NOTE  Michelle Greer is a 27 y.o. [id number] at [redacted]w[redacted]d admitted for IOL for IUGR <1%, oligohydramnios.  Subjective: Doing well not feeling contractions   Objective: BP 130/79   Pulse (!) 105   Temp 98.2 F (36.8 C) (Oral)   Resp 18   Ht '5\' 6"'$  (1.676 m)   Wt 79.3 kg   LMP 04/25/2022   SpO2 96%   BMI 28.23 kg/m  or  Vitals:   01/20/23 1629 01/20/23 1957 01/21/23 0002 01/21/23 0020  BP: (!) 127/55 134/65 (!) 121/52 130/79  Pulse: (!) 106 100 93 (!) 105  Resp: '18 17 18 18  '$ Temp: (!) 97.5 F (36.4 C) 97.9 F (36.6 C) 98.2 F (36.8 C)   TempSrc: Oral Oral Oral   SpO2: 99% 99% 96%   Weight:      Height:         Dilation: 1.5 Effacement (%): 70 Station: -2 Presentation: Vertex Exam by:: Delita Chiquito MD FHT: baseline rate 135, moderate varibility, + acel, no decel Toco: none  Labs: Lab Results  Component Value Date   WBC 8.7 01/19/2023   HGB 9.5 (L) 01/19/2023   HCT 29.1 (L) 01/19/2023   MCV 81.7 01/19/2023   PLT 314 01/19/2023    Patient Active Problem List   Diagnosis Date Noted   Fetal growth restriction antepartum 01/20/2023   Abnormal fetal ultrasound 01/20/2023   Oligohydramnios antepartum 01/19/2023   Anemia of pregnancy 12/02/2022   Supervision of high risk pregnancy, antepartum 11/17/2022   History of postpartum hypertension 05/24/2018    Assessment / Plan: 27y.o. [id number] for IOL for IUGR, oligohydramnios, fetal cardiomegaly.   Labor: Induction started with  Fetal Wellbeing:  Cat 1 Pain Control:  per patient request  Anticipated MOD:  vaginal  ID: Preterm, no GBS culture. Will start PCN  ViGifford ShaveMD  OB Fellow  01/21/2023, 1:04 AM

## 2023-01-21 NOTE — Anesthesia Procedure Notes (Signed)
Epidural Patient location during procedure: OB Start time: 01/21/2023 7:45 AM End time: 01/21/2023 7:53 AM  Staffing Anesthesiologist: Pervis Hocking, DO Performed: anesthesiologist   Preanesthetic Checklist Completed: patient identified, IV checked, risks and benefits discussed, monitors and equipment checked, pre-op evaluation and timeout performed  Epidural Patient position: sitting Prep: DuraPrep and site prepped and draped Patient monitoring: continuous pulse ox, blood pressure, heart rate and cardiac monitor Approach: midline Location: L3-L4 Injection technique: LOR air  Needle:  Needle type: Tuohy  Needle gauge: 17 G Needle length: 9 cm Catheter type: closed end flexible Catheter size: 19 Gauge Catheter at skin depth: 10 cm Test dose: negative  Assessment Sensory level: T8 Events: blood not aspirated, no cerebrospinal fluid, injection not painful, no injection resistance, no paresthesia and negative IV test  Additional Notes Patient identified. Risks/Benefits/Options discussed with patient including but not limited to bleeding, infection, nerve damage, paralysis, failed block, incomplete pain control, headache, blood pressure changes, nausea, vomiting, reactions to medication both or allergic, itching and postpartum back pain. Confirmed with bedside nurse the patient's most recent platelet count. Confirmed with patient that they are not currently taking any anticoagulation, have any bleeding history or any family history of bleeding disorders. Patient expressed understanding and wished to proceed. All questions were answered. Sterile technique was used throughout the entire procedure. Please see nursing notes for vital signs. Test dose was given through epidural catheter and negative prior to continuing to dose epidural or start infusion. Warning signs of high block given to the patient including shortness of breath, tingling/numbness in hands, complete motor block, or any  concerning symptoms with instructions to call for help. Patient was given instructions on fall risk and not to get out of bed. All questions and concerns addressed with instructions to call with any issues or inadequate analgesia.  Reason for block:procedure for pain

## 2023-01-21 NOTE — Discharge Summary (Shared)
Postpartum Discharge Summary  Date of Service updated***     Patient Name: Michelle Greer DOB: December 14, 1995 MRN: [id number]  Date of admission: 01/19/2023 Delivery date:01/21/2023  Delivering provider: Darlen Round  Date of discharge: 01/21/2023  Admitting diagnosis: Oligohydramnios antepartum [O41.00X0] Intrauterine pregnancy: [redacted]w[redacted]d    Secondary diagnosis:  Principal Problem:   Anemia of pregnancy Active Problems:   Supervision of high risk pregnancy, antepartum   Oligohydramnios antepartum   Fetal growth restriction antepartum   Abnormal fetal ultrasound   Vaginal delivery  Additional problems: ***    Discharge diagnosis: {DX.:23714}                                              Post partum procedures:{Postpartum procedures:23558} Augmentation: AROM, Pitocin, and IP Foley Complications: None  Hospital course: Induction of Labor With Vaginal Delivery   27y.o. yo [id number] 318w5das admitted to the hospital 01/19/2023 for induction of labor.  Indication for induction:  severe FGR, oligohydramnios .  Patient had an labor course complicated by: none Membrane Rupture Time/Date: 9:10 AM ,01/21/2023   Delivery Method:Vaginal, Spontaneous  Episiotomy: None  Lacerations:  None  Details of delivery can be found in separate delivery note.  Patient had a postpartum course complicated by***. Patient is discharged home 01/21/23.  Newborn Data: Birth date:01/21/2023  Birth time:12:10 PM  Gender:Female  Living status:Living  Apgars:9 ,9  Weight:1540 g   Magnesium Sulfate received: No BMZ received: Yes Rhophylac:N/A MMR:N/A T-DaP:{Tdap:23962} Flu: {FWG:1132360ransfusion:{Transfusion received:30440034}  Physical exam  Vitals:   01/21/23 1316 01/21/23 1430 01/21/23 1530 01/21/23 1907  BP: 130/72 128/76 128/74 135/82  Pulse: 81 84 80 89  Resp: '18 18 18 17  '$ Temp:  98.4 F (36.9 C) 98.4 F (36.9 C) 99.2 F (37.3 C)  TempSrc:  Oral Oral Axillary  SpO2:    100%  Weight:       Height:       General: {Exam; general:21111117} Lochia: {Desc; appropriate/inappropriate:30686::"appropriate"} Uterine Fundus: {Desc; firm/soft:30687} Incision: {Exam; incision:21111123} DVT Evaluation: {Exam; dvYS:4447741abs: Lab Results  Component Value Date   WBC 15.5 (H) 01/21/2023   HGB 8.8 (L) 01/21/2023   HCT 27.9 (L) 01/21/2023   MCV 82.5 01/21/2023   PLT 313 01/21/2023      Latest Ref Rng & Units 09/06/2022    2:06 PM  CMP  Glucose 70 - 99 mg/dL 83   BUN 6 - 20 mg/dL 6   Creatinine 0.44 - 1.00 mg/dL 0.57   Sodium 135 - 145 mmol/L 134   Potassium 3.5 - 5.1 mmol/L 3.9   Chloride 98 - 111 mmol/L 104   CO2 22 - 32 mmol/L 23   Calcium 8.9 - 10.3 mg/dL 8.6   Total Protein 6.5 - 8.1 g/dL 6.3   Total Bilirubin 0.3 - 1.2 mg/dL 0.6   Alkaline Phos 38 - 126 U/L 34   AST 15 - 41 U/L 14   ALT 0 - 44 U/L 10    Edinburgh Score:     No data to display           After visit meds:  Allergies as of 01/21/2023       Reactions   Tomato Hives     Med Rec must be completed prior to [facility]        Discharge home in stable  condition Infant Feeding: {Baby feeding:23562} Infant Disposition:{CHL IP OB HOME WITH [id number] Discharge instruction: per After Visit Summary and Postpartum booklet. Activity: Advance as tolerated. Pelvic rest for 6 weeks.  Diet: {OB BY:630183 Future Appointments: Future Appointments  Date Time Provider Connell  01/26/2023  1:45 PM [facility]  02/01/2023  9:55 AM Radene Gunning, MD [facility]   Follow up Visit:  The following message was sent to [facility] by Mikki Santee, MD  Please schedule this patient for a inperson postpartum visit in 6 weeks with the following provider: Any provider. Additional Postpartum F/U:Postpartum Depression checkup in 2 weeks High risk pregnancy complicated by:  severe FGR, oligohydramnios Delivery mode:  Vaginal, Spontaneous  Anticipated Birth  Control:  Depo   01/21/2023 Chirstopher Iovino Sherrilyn Rist, MD

## 2023-01-22 ENCOUNTER — Encounter (HOSPITAL_COMMUNITY): Payer: Self-pay | Admitting: Obstetrics and Gynecology

## 2023-01-22 LAB — CBC
HCT: 25.6 % — ABNORMAL LOW (ref 36.0–46.0)
Hemoglobin: 8 g/dL — ABNORMAL LOW (ref 12.0–15.0)
MCH: 25.9 pg — ABNORMAL LOW (ref 26.0–34.0)
MCHC: 31.3 g/dL (ref 30.0–36.0)
MCV: 82.8 fL (ref 80.0–100.0)
Platelets: 305 10*3/uL (ref 150–400)
RBC: 3.09 MIL/uL — ABNORMAL LOW (ref 3.87–5.11)
RDW: 14.6 % (ref 11.5–15.5)
WBC: 18 10*3/uL — ABNORMAL HIGH (ref 4.0–10.5)
nRBC: 0.2 % (ref 0.0–0.2)

## 2023-01-22 MED ORDER — ACETAMINOPHEN 500 MG PO TABS
1000.0000 mg | ORAL_TABLET | Freq: Three times a day (TID) | ORAL | Status: DC
  Administered 2023-01-22 – 2023-01-23 (×4): 1000 mg via ORAL
  Filled 2023-01-22 (×4): qty 2

## 2023-01-22 MED ORDER — IRON SUCROSE 500 MG IVPB - SIMPLE MED
500.0000 mg | Freq: Once | INTRAVENOUS | Status: AC
  Administered 2023-01-22: 500 mg via INTRAVENOUS
  Filled 2023-01-22: qty 275

## 2023-01-22 MED ORDER — OXYCODONE HCL 5 MG PO TABS
5.0000 mg | ORAL_TABLET | ORAL | Status: DC | PRN
  Administered 2023-01-22 – 2023-01-23 (×2): 5 mg via ORAL
  Filled 2023-01-22 (×2): qty 1

## 2023-01-22 NOTE — Progress Notes (Signed)
Breast pump setup for pt.

## 2023-01-22 NOTE — Progress Notes (Signed)
POSTPARTUM PROGRESS NOTE  Post Partum Day 1  Subjective:  Michelle Greer is a 27 y.o. [id number] s/p VD at [redacted]w[redacted]d  She reports she is doing well. No acute events overnight. She denies any problems with ambulating, voiding or po intake. Denies nausea or vomiting.  Pain is moderately controlled.  Lochia is minimal. Feels dizzy with ambulation.  Objective: Blood pressure 132/82, pulse 67, temperature 97.7 F (36.5 C), temperature source Oral, resp. rate 17, height '5\' 6"'$  (1.676 m), weight 79.3 kg, last menstrual period 04/25/2022, SpO2 100 %, unknown if currently breastfeeding.  Physical Exam:  General: alert, cooperative and no distress Chest: no respiratory distress Heart:regular rate, distal pulses intact Abdomen: soft, nontender,  Uterine Fundus: firm, appropriately tender DVT Evaluation: No calf swelling or tenderness Extremities: no edema Skin: warm, dry  Recent Labs    01/21/23 0725 01/22/23 0559  HGB 8.8* 8.0*  HCT 27.9* 25.6*    Assessment/Plan: Michelle Greer a 27y.o. [id number]/p VD at 351w5d PPD#1 - Doing well  Routine postpartum care Anemia: moderate and symptomatic; likely iron deficiency. IV venofer ordered Contraception: depo Feeding: breast Dispo: Plan for discharge tomorrow.   LOS: 3 days   Michelle Greer, [name]/12/2022

## 2023-01-22 NOTE — Lactation Note (Signed)
This note was copied from a baby's chart.  NICU Lactation Consultation Note  Patient Name: [name] [id number] Date: 01/22/2023 Age:27 years  Subjective Reason for consult: Initial assessment; NICU baby; Late-preterm 34-36.6wks; Other (Comment); Infant < 6lbs (SGA)  Visited with family of 61 hours old LPI NICU female; Ms. Posthuma is a P3 and reports she hasn't started pumping yet. Explained the importance of consistent pumping for the onset of lactogenesis II and to protect her supply. Let her know that the purpose of pumping this early on is mainly for breast stimulation and not to get volume, she voiced understanding. Her goal is to do both feedings at the breast and also pumping and bottle feeding with breastmilk and formula. Reviewed pumping schedule, lactogenesis II and anticipatory guidelines.  Objective Infant data: Mother's Current Feeding Choice: Breast Milk and Formula  Maternal data: [id number]  Vaginal, Spontaneous Significant Breast History:: (+) breast changes during the pregnancy; piercing on both nipples  Current breast feeding challenges:: NICU admission  Does the patient have breastfeeding experience prior to this delivery?: Yes How long did the patient breastfeed?: 6-7 months  Pumping frequency: Hasn't initiated pumping yet, pump was set up this morning around 9 am Flange Size: 24  Risk factor for low milk supply:: prematurity, infant separation  WIC Program: Yes WIC Referral Sent?: Yes [place]  Assessment Infant: Feeding Status: NPO  Maternal: Both nipples are pierced; advise to remove piercing prior pumping  Intervention/Plan Interventions: Breast feeding basics reviewed; DEBP; Education; [employer] brochure  Tools: Pump; Flanges Pump Education: Setup, frequency, and cleaning; Milk Storage  Plan of care: Encouraged pumping every 3 hours, ideally 8 pumping sessions/24 hours Hand expression and breast massage were also encouraged prior  pumping  FOB and paternal GOB present. All questions and concerns answered, family to contact [facility] services PRN.  Consult Status: NICU follow-up  NICU Follow-up type: New admission follow up; Maternal D/C visit   Michelle Greer 01/22/2023, 12:30 PM

## 2023-01-22 NOTE — Anesthesia Postprocedure Evaluation (Signed)
Anesthesia Post Note  Patient: Michelle Greer  Procedure(s) Performed: AN AD HOC LABOR EPIDURAL     Patient location during evaluation: Mother Baby Anesthesia Type: Epidural Level of consciousness: awake and alert Pain management: pain level controlled Vital Signs Assessment: post-procedure vital signs reviewed and stable Respiratory status: spontaneous breathing, nonlabored ventilation and respiratory function stable Cardiovascular status: stable Postop Assessment: no headache, no backache and epidural receding Anesthetic complications: no   No notable events documented.  Last Vitals:  Vitals:   01/21/23 2321 01/22/23 0445  BP: (!) 118/59 132/82  Pulse: 87 67  Resp: 17 17  Temp: 37.2 C 36.5 C  SpO2: 100% 100%    Last Pain:  Vitals:   01/22/23 0700  TempSrc:   PainSc: 8    Pain Goal: Patients Stated Pain Goal: 3 (01/20/23 0815)                 Barkley Boards

## 2023-01-23 ENCOUNTER — Ambulatory Visit: Payer: Self-pay

## 2023-01-23 MED ORDER — BENZOCAINE-MENTHOL 20-0.5 % EX AERO: 1.0000 | INHALATION_SPRAY | CUTANEOUS | 0 refills | Status: AC | PRN

## 2023-01-23 MED ORDER — IBUPROFEN 600 MG PO TABS: 600.0000 mg | ORAL_TABLET | Freq: Four times a day (QID) | ORAL | 0 refills | Status: DC

## 2023-01-23 MED ORDER — ACETAMINOPHEN 500 MG PO TABS: 1000.0000 mg | ORAL_TABLET | Freq: Three times a day (TID) | ORAL | 0 refills | Status: DC

## 2023-01-23 MED ORDER — MEDROXYPROGESTERONE ACETATE 150 MG/ML IM SUSP
150.0000 mg | Freq: Once | INTRAMUSCULAR | Status: AC
  Administered 2023-01-23: 150 mg via INTRAMUSCULAR
  Filled 2023-01-23: qty 1

## 2023-01-23 NOTE — Clinical Social Work Maternal (Signed)
CLINICAL SOCIAL WORK MATERNAL/CHILD NOTE  Patient Details  Name: Michelle Greer MRN: [id number] Date of Birth: September 23, 1996  Date:  01/23/2023  Clinical Social Worker Initiating Note:  Idamae Lusher, [place] Date/Time: Initiated:  01/23/23/1113     Child's Name:  Imagene Riches, DOB: 01/21/2023   Biological Parents:  Mother, Father (FOB: Mabeline Caras, DOB: 10/27/1977)   Need for Interpreter:  None   Reason for Referral:  Behavioral Health Concerns (NICU admission)   Address:  [place] [address] Pt currently resides at an extended stay hotel [facility], [place], [place], [place], [address])   Phone number:  [phone] (Primary phone number)  Additional phone number: [phone]   Household Members/Support Persons (HM/SP):   Household Member/Support Person 1, Household Member/Support Person 2, Household Member/Support Person 3   HM/SP Name Relationship DOB or Age  HM/SP -1 Mabeline Caras FOB 10/27/1977  HM/SP -2 Woodfin Ganja Son 07/16/2013  HM/SP -3 Korey Bristow Son 12/21/2018  HM/SP -4        HM/SP -5        HM/SP -6        HM/SP -7        HM/SP -8          Natural Supports (not living in the home):      Professional Supports: None   Employment: Unemployed   Type of Work:     Education:  Programmer, systems   Homebound arranged:    Museum/gallery curator Resources:  Medicaid   Other Resources:  Physicist, medical   ([place] referral placed during consult)   Cultural/Religious Considerations Which May Impact Care:  None identified  Strengths:  Ability to meet basic needs     Psychotropic Medications:         Pediatrician:       Pediatrician List:   [place]      Pediatrician Fax Number:    Risk Factors/Current Problems:  Basic Needs  , Mental Health Concerns     Cognitive State:  Linear Thinking  , Able to Concentrate  , Alert  , Goal  Oriented     Mood/Affect:  Calm  , Comfortable  , Relaxed  , Interested     CSW Assessment: CSW was consulted due to history of postpartum depression, history of depression and anxiety, and Edinburgh Postnatal Depression Scale score of 17. CSW met with MOB at bedside to complete assessment. When CSW entered room, MOB was observed sitting in hospital bed. Infant was asleep in isolette. CSW introduced self and explained reason for consult. MOB presented as calm, was agreeable to consult and remained engaged throughout encounter.   CSW collected demographic information. MOB states that she is currently staying at an extended stay hotel with FOB and her 2 other children.   CSW inquired how MOB has felt emotionally since infant's birth. MOB states she is doing alright, adding that is it hard for her to know how she is feeling and is doing her best to take things as they come. CSW normalized and validated MOB's feelings. CSW inquired about MOB's mental health history. MOB states she was diagnosed with depression and anxiety during her pregnancy with her first son in 2014. MOB recalls experiencing symptoms of postpartum depression after er first was was born, marked by feeling irritated, "having an attitude", and not  wanting to be around others. MOB states that she presented to [facility] for mental health treatment in 2014 but did not complete her visit due to not wanting to be on medication at the time. MOB expressed concern regarding taking medication during pregnancy.   CSW inquired about MOB's mental health symptoms during pregnancy with infant. MOB shared that during her pregnancy, she experienced situational stressors, marked by financial stress and housing instability. MOB shared that she has felt like she is "just here" and feels as if she is feeling a lot of emotions at once. MOB identified with feeling overwhelmed emotionally. CSW inquired about current mental health treatment. MOB states she has met with  integrated behavioral health therapist, Vesta Mixer during her pregnancy through her OBGYN clinic and has an upcoming appointment with Roselyn Reef 01/26/23. MOB shared that meeting with Roselyn Reef is helpful. MOB was also agreeable to crisis and outpatient mental health resources, which CSW provided. MOB states that she was prescribed Zoloft during pregnancy but did not begin taking the medication due to concern about how the medication would affect infant. MOB states she still has the prescription and is unsure if she would like to begin taking medication postpartum. CSW encouraged MOB to discuss concerns with her OBGYN. MOB identified FOB as a support, sharing that he encourages her to take time for herself. MOB identified listening to music and getting her nails done as coping skills. MOB shared that she feels she has not had a lot of time for herself and expressed how she has a difficult time stepping away to do so. CSW provided emotional support and encouraged MOB to prioritize self care. MOB denied current SI/HI/DV.  CSW provided education regarding the baby blues period vs. perinatal mood disorders, discussed treatment and gave resources for mental health follow up if concerns arise.  CSW recommends self-evaluation during the postpartum time period using the New Mom Checklist from Postpartum Progress and encouraged MOB to contact a medical professional if symptoms are noted at any time.    CSW and MOB discussed infant's NICU admission. CSW informed MOB about the NICU, what to expect, and resources/supports available while infant is admitted to the NICU. MOB reported that she feels updated about infant's care. MOB denied any transportation barriers with visiting infant in the NICU. CSW provided information about [facility] [employer] and inquired about resource needs while inpatient. MOB expressed that meal vouchers and gas cards would be helpful. CSW inquired if MOB has needed items for infant. MOB states that she  has ordered a bassinet and car seat for infant; however, the items have not yet arrived. MOB states she is in need of all other essentials for infant. CSW provided information about [employer] and inquired if MOB would like CSW to place a referral. MOB stated that she would like a referral to [employer], CSW placed referral. CSW provided information about the hospital car seat program and hospital pack n play program if MOB is still in need of a car seat and safe sleep area for infant and has exhausted all other options at the time of infant's discharge. CSW provided MOB with a pediatrician list.   CSW assessed for additional resource needs. MOB receives food stamps and expressed interest in [facility]. CSW placed [facility] referral with MOB's verbal consent. CSW provided information about [place] financial assistance and encouraged MOB to notify a CSW if she chooses to apply so that CSW can complete a healthcare verification form.  CSW provided  MOB with 8 meal vouchers and 2 gas cards.  Review of Sudden Infant Death Syndrome (SIDS) precautions to be completed at a later time.  CSW will continue to offer support and resources to family while infant remains in NICU.   CSW Plan/Description:  No Further Intervention Required/No Barriers to Discharge, Psychosocial Support and Ongoing Assessment of Needs, Perinatal Mood and Anxiety Disorder (PMADs) Education, Other Information/Referral to [place], [place] 01/23/2023, 11:24 AM

## 2023-01-23 NOTE — Lactation Note (Signed)
This note was copied from a baby's chart.  NICU Lactation Consultation Note  Patient Name: Michelle Greer [id number] Date: 01/23/2023 Age:29 hours  Subjective Reason for consult: Follow-up assessment; NICU baby; Maternal discharge; Late-preterm 34-36.6wks; Other (Comment); Infant < 6lbs (SGA)  Visited with family of 8 hours old LPI NICU female; Michelle Greer is a P3 and reports her milk is in. She has started pumping but it's still not consistently. Explained the importance of prompt milk removal for the prevention of engorgement and to protect her supply; she voiced understanding. She got discharged today. Reviewed discharge education, engorgement prevention/treatment, pumping schedule, lactogenesis II/III and anticipatory guidelines. Michelle Greer is rooming in with baby and will use pump in baby's room. [facility] referral sent on admission.  Objective Infant data: Mother's Current Feeding Choice: Breast Milk and Formula  Infant feeding assessment Scale for Readiness: 3  Maternal data: [id number]  Vaginal, Spontaneous Significant Breast History:: (+) breast changes during the pregnancy; piercing on both nipples  Current breast feeding challenges:: NICU admission  Does the patient have breastfeeding experience prior to this delivery?: Yes How long did the patient breastfeed?: 6-7 months  Pumping frequency: 2 times/24 hours Pumped volume: 30 mL (30-45 ml) Flange Size: 24  Risk factor for low milk supply:: prematurity, infant separation  WIC Program: Yes WIC Referral Sent?: Yes [place]  Assessment Infant: Feeding Status: -- (Scheduled feedings)  Maternal: Milk volume: Normal  Intervention/Plan Interventions: Breast feeding basics reviewed; DEBP; Education  Tools: Pump; Flanges Pump Education: Setup, frequency, and cleaning; Milk Storage  Plan of care: Encouraged pumping every 3 hours, ideally 8 pumping sessions/24 hours She'll take all pump parts to baby's room She'll switch  her pump settings from initiation to expression mode She'll call for latch assistance when she's ready to take baby to breast   No other support person at this time. All questions and concerns answered, family to contact [facility] services PRN.  Consult Status: NICU follow-up  NICU Follow-up type: Verify absence of engorgement; Weekly NICU follow up   [place] 01/23/2023, 5:16 PM

## 2023-01-24 LAB — SURGICAL PATHOLOGY

## 2023-01-26 ENCOUNTER — Ambulatory Visit: Payer: Self-pay

## 2023-01-26 ENCOUNTER — Ambulatory Visit: Payer: Medicaid Other | Admitting: Clinical

## 2023-01-26 NOTE — Lactation Note (Signed)
This note was copied from a baby's chart.  NICU Lactation Consultation Note  Patient Name: Michelle Greer [id number] Date: 01/26/2023 Age:27 years  Subjective Reason for consult: Follow-up assessment; NICU baby; Late-preterm 34-36.6wks; Other (Comment); Infant < 6lbs (SGA)  Visited with family of 75 72/61 weeks old AGA NICU female; Ms. Dorce is a P3 and she reports she's pumping but still not consistently. She voiced some discomforts and "knots" on both breasts, provided ice packs. Baby is currently on formula due to possible galactosemia, still awaiting results. Family is rooming in together, her plan is to eventually take baby to breast once he's ready.    Objective Infant data: Mother's Current Feeding Choice: Breast Milk and Formula  Infant feeding assessment Scale for Readiness: 1  Maternal data: [id number]  Vaginal, Spontaneous Pumping frequency: 3 times/24 hours Pumped volume: 90 mL Flange Size: 24 WIC Program: Yes WIC Referral Sent?: Yes  Assessment Infant: In NICU  Maternal: Milk volume: Normal No S/S of engorgement at this time  Intervention/Plan Interventions: Breast feeding basics reviewed; DEBP; Education Tools: Pump; Flanges Pump Education: Setup, frequency, and cleaning; Milk Storage  Plan of care: Encouraged pumping every 3 hours, ideally 8 pumping sessions/24 hours She'll continue icing her breast as needed She'll call for latch assistance when she's ready to take baby to breast   FOB present. All questions and concerns answered, family to contact [facility] services PRN.  Consult Status: NICU follow-up  NICU Follow-up type: Weekly NICU follow up   [place] 01/26/2023, 4:53 PM

## 2023-01-28 ENCOUNTER — Other Ambulatory Visit: Payer: Self-pay

## 2023-01-28 ENCOUNTER — Encounter (HOSPITAL_COMMUNITY): Payer: Self-pay | Admitting: Obstetrics and Gynecology

## 2023-01-28 ENCOUNTER — Inpatient Hospital Stay (HOSPITAL_COMMUNITY): Payer: Medicaid Other | Admitting: Anesthesiology

## 2023-01-28 ENCOUNTER — Encounter (HOSPITAL_COMMUNITY)
Admission: AD | Disposition: A | Discharge status: Home / Self Care | Payer: Self-pay | Source: Home / Self Care | Attending: Obstetrics and Gynecology

## 2023-01-28 ENCOUNTER — Inpatient Hospital Stay (HOSPITAL_COMMUNITY)
Admission: AD | Admit: 2023-01-28 | Discharge: 2023-01-28 | Disposition: A | Discharge status: Home / Self Care | Payer: Medicaid Other | Attending: Obstetrics and Gynecology | Admitting: Obstetrics and Gynecology

## 2023-01-28 ENCOUNTER — Inpatient Hospital Stay (HOSPITAL_COMMUNITY): Payer: Medicaid Other

## 2023-01-28 ENCOUNTER — Inpatient Hospital Stay (EMERGENCY_DEPARTMENT_HOSPITAL): Payer: Medicaid Other | Admitting: Anesthesiology

## 2023-01-28 DIAGNOSIS — R102 Pelvic and perineal pain: Secondary | ICD-10-CM | POA: Insufficient documentation

## 2023-01-28 DIAGNOSIS — F32A Depression, unspecified: Secondary | ICD-10-CM | POA: Insufficient documentation

## 2023-01-28 DIAGNOSIS — O9089 Other complications of the puerperium, not elsewhere classified: Secondary | ICD-10-CM | POA: Insufficient documentation

## 2023-01-28 DIAGNOSIS — D649 Anemia, unspecified: Secondary | ICD-10-CM | POA: Insufficient documentation

## 2023-01-28 DIAGNOSIS — R1031 Right lower quadrant pain: Secondary | ICD-10-CM | POA: Diagnosis present

## 2023-01-28 DIAGNOSIS — Z3A00 Weeks of gestation of pregnancy not specified: Secondary | ICD-10-CM

## 2023-01-28 HISTORY — PX: DILATION AND EVACUATION: SHX1459

## 2023-01-28 LAB — CBC WITH DIFFERENTIAL/PLATELET
Abs Immature Granulocytes: 0.29 10*3/uL — ABNORMAL HIGH (ref 0.00–0.07)
Basophils Absolute: 0.1 10*3/uL (ref 0.0–0.1)
Basophils Relative: 1 %
Eosinophils Absolute: 0.3 10*3/uL (ref 0.0–0.5)
Eosinophils Relative: 4 %
HCT: 32.4 % — ABNORMAL LOW (ref 36.0–46.0)
Hemoglobin: 10.4 g/dL — ABNORMAL LOW (ref 12.0–15.0)
Immature Granulocytes: 4 %
Lymphocytes Relative: 27 %
Lymphs Abs: 2.2 10*3/uL (ref 0.7–4.0)
MCH: 27.4 pg (ref 26.0–34.0)
MCHC: 32.1 g/dL (ref 30.0–36.0)
MCV: 85.5 fL (ref 80.0–100.0)
Monocytes Absolute: 0.9 10*3/uL (ref 0.1–1.0)
Monocytes Relative: 11 %
Neutro Abs: 4.4 10*3/uL (ref 1.7–7.7)
Neutrophils Relative %: 53 %
Platelets: 416 10*3/uL — ABNORMAL HIGH (ref 150–400)
RBC: 3.79 MIL/uL — ABNORMAL LOW (ref 3.87–5.11)
RDW: 18.3 % — ABNORMAL HIGH (ref 11.5–15.5)
WBC: 8.2 10*3/uL (ref 4.0–10.5)
nRBC: 0 % (ref 0.0–0.2)

## 2023-01-28 LAB — URINALYSIS, ROUTINE W REFLEX MICROSCOPIC
Bilirubin Urine: NEGATIVE
Glucose, UA: NEGATIVE mg/dL
Ketones, ur: NEGATIVE mg/dL
Nitrite: NEGATIVE
Protein, ur: NEGATIVE mg/dL
Specific Gravity, Urine: 1.018 (ref 1.005–1.030)
pH: 6 (ref 5.0–8.0)

## 2023-01-28 LAB — TYPE AND SCREEN
ABO/RH(D): O POS
Antibody Screen: NEGATIVE

## 2023-01-28 SURGERY — DILATION AND EVACUATION, UTERUS
Anesthesia: General

## 2023-01-28 MED ORDER — PROPOFOL 10 MG/ML IV BOLUS
INTRAVENOUS | Status: DC | PRN
  Administered 2023-01-28: 200 mg via INTRAVENOUS

## 2023-01-28 MED ORDER — LACTATED RINGERS IV BOLUS
1000.0000 mL | Freq: Once | INTRAVENOUS | Status: AC
  Administered 2023-01-28: 1000 mL via INTRAVENOUS

## 2023-01-28 MED ORDER — ORAL CARE MOUTH RINSE: 15.0000 mL | Freq: Once | OROMUCOSAL | Status: AC

## 2023-01-28 MED ORDER — MIDAZOLAM HCL 2 MG/2ML IJ SOLN
INTRAMUSCULAR | Status: DC | PRN
  Administered 2023-01-28: 2 mg via INTRAVENOUS

## 2023-01-28 MED ORDER — TRANEXAMIC ACID-NACL 1000-0.7 MG/100ML-% IV SOLN
1000.0000 mg | INTRAVENOUS | Status: AC
  Administered 2023-01-28: 1000 mg via INTRAVENOUS
  Filled 2023-01-28: qty 100

## 2023-01-28 MED ORDER — MIDAZOLAM HCL 2 MG/2ML IJ SOLN
INTRAMUSCULAR | Status: AC
  Filled 2023-01-28: qty 2

## 2023-01-28 MED ORDER — ONDANSETRON 4 MG PO TBDP: 4.0000 mg | ORAL_TABLET | Freq: Four times a day (QID) | ORAL | 0 refills | Status: AC | PRN

## 2023-01-28 MED ORDER — FENTANYL CITRATE (PF) 250 MCG/5ML IJ SOLN
INTRAMUSCULAR | Status: AC
  Filled 2023-01-28: qty 5

## 2023-01-28 MED ORDER — MEPERIDINE HCL 25 MG/ML IJ SOLN: 6.2500 mg | INTRAMUSCULAR | Status: DC | PRN

## 2023-01-28 MED ORDER — METHYLERGONOVINE MALEATE 0.2 MG/ML IJ SOLN
INTRAMUSCULAR | Status: AC
  Filled 2023-01-28: qty 1

## 2023-01-28 MED ORDER — ACETAMINOPHEN 500 MG PO TABS
1000.0000 mg | ORAL_TABLET | Freq: Once | ORAL | Status: AC
  Administered 2023-01-28: 1000 mg via ORAL
  Filled 2023-01-28: qty 2

## 2023-01-28 MED ORDER — PROMETHAZINE HCL 25 MG/ML IJ SOLN: 6.2500 mg | INTRAMUSCULAR | Status: DC | PRN

## 2023-01-28 MED ORDER — OXYCODONE HCL 5 MG/5ML PO SOLN: 5.0000 mg | Freq: Once | ORAL | Status: DC | PRN

## 2023-01-28 MED ORDER — KETOROLAC TROMETHAMINE 30 MG/ML IJ SOLN
INTRAMUSCULAR | Status: AC
  Filled 2023-01-28: qty 2

## 2023-01-28 MED ORDER — OXYCODONE HCL 5 MG PO TABS: 5.0000 mg | ORAL_TABLET | ORAL | 0 refills | Status: AC | PRN

## 2023-01-28 MED ORDER — LACTATED RINGERS IV SOLN: INTRAVENOUS | Status: DC

## 2023-01-28 MED ORDER — ONDANSETRON HCL 4 MG/2ML IJ SOLN
INTRAMUSCULAR | Status: DC | PRN
  Administered 2023-01-28: 4 mg via INTRAVENOUS

## 2023-01-28 MED ORDER — DEXAMETHASONE SODIUM PHOSPHATE 10 MG/ML IJ SOLN
INTRAMUSCULAR | Status: AC
  Filled 2023-01-28: qty 2

## 2023-01-28 MED ORDER — LIDOCAINE 2% (20 MG/ML) 5 ML SYRINGE
INTRAMUSCULAR | Status: DC | PRN
  Administered 2023-01-28: 40 mg via INTRAVENOUS

## 2023-01-28 MED ORDER — FENTANYL CITRATE (PF) 250 MCG/5ML IJ SOLN
INTRAMUSCULAR | Status: DC | PRN
  Administered 2023-01-28: 50 ug via INTRAVENOUS

## 2023-01-28 MED ORDER — DEXAMETHASONE SODIUM PHOSPHATE 10 MG/ML IJ SOLN
INTRAMUSCULAR | Status: DC | PRN
  Administered 2023-01-28: 5 mg via INTRAVENOUS

## 2023-01-28 MED ORDER — TRANEXAMIC ACID-NACL 1000-0.7 MG/100ML-% IV SOLN
INTRAVENOUS | Status: AC
  Filled 2023-01-28: qty 100

## 2023-01-28 MED ORDER — ONDANSETRON HCL 4 MG/2ML IJ SOLN
INTRAMUSCULAR | Status: AC
  Filled 2023-01-28: qty 4

## 2023-01-28 MED ORDER — IBUPROFEN 600 MG PO TABS: 600.0000 mg | ORAL_TABLET | Freq: Four times a day (QID) | ORAL | 1 refills | Status: DC | PRN

## 2023-01-28 MED ORDER — DOXYCYCLINE HYCLATE 100 MG IV SOLR
200.0000 mg | INTRAVENOUS | Status: AC
  Administered 2023-01-28: 200 mg via INTRAVENOUS
  Filled 2023-01-28: qty 200

## 2023-01-28 MED ORDER — METHYLERGONOVINE MALEATE 0.2 MG/ML IJ SOLN
INTRAMUSCULAR | Status: DC | PRN
  Administered 2023-01-28: .2 mg via INTRAMUSCULAR

## 2023-01-28 MED ORDER — HYDROMORPHONE HCL 1 MG/ML IJ SOLN
1.0000 mg | Freq: Once | INTRAMUSCULAR | Status: AC
  Administered 2023-01-28: 1 mg via INTRAVENOUS
  Filled 2023-01-28: qty 1

## 2023-01-28 MED ORDER — CHLORHEXIDINE GLUCONATE 0.12 % MT SOLN
15.0000 mL | Freq: Once | OROMUCOSAL | Status: AC
  Administered 2023-01-28: 15 mL via OROMUCOSAL

## 2023-01-28 MED ORDER — SCOPOLAMINE 1 MG/3DAYS TD PT72
1.0000 | MEDICATED_PATCH | Freq: Once | TRANSDERMAL | Status: AC
  Administered 2023-01-28: 1.5 mg via TRANSDERMAL
  Filled 2023-01-28: qty 1

## 2023-01-28 MED ORDER — MIDAZOLAM HCL 2 MG/2ML IJ SOLN: 0.5000 mg | Freq: Once | INTRAMUSCULAR | Status: DC | PRN

## 2023-01-28 MED ORDER — OXYCODONE HCL 5 MG PO TABS: 5.0000 mg | ORAL_TABLET | Freq: Once | ORAL | Status: DC | PRN

## 2023-01-28 MED ORDER — FENTANYL CITRATE (PF) 100 MCG/2ML IJ SOLN: 25.0000 ug | INTRAMUSCULAR | Status: DC | PRN

## 2023-01-28 SURGICAL SUPPLY — 20 items
CATH ROBINSON RED A/P 16FR (CATHETERS) IMPLANT
FILTER UTR ASPR ASSEMBLY (MISCELLANEOUS) ×1 IMPLANT
GLOVE SURG ORTHO 8.0 STRL STRW (GLOVE) ×1 IMPLANT
GOWN STRL REUS W/ TWL LRG LVL3 (GOWN DISPOSABLE) ×1 IMPLANT
GOWN STRL REUS W/ TWL XL LVL3 (GOWN DISPOSABLE) ×1 IMPLANT
GOWN STRL REUS W/TWL LRG LVL3 (GOWN DISPOSABLE) ×1
GOWN STRL REUS W/TWL XL LVL3 (GOWN DISPOSABLE) ×1
HOSE CONNECTING 18IN BERKELEY (TUBING) ×1 IMPLANT
KIT BERKELEY 1ST TRI 3/8 NO TR (MISCELLANEOUS) ×1 IMPLANT
KIT BERKELEY 1ST TRIMESTER 3/8 (MISCELLANEOUS) ×1 IMPLANT
NS IRRIG 1000ML POUR BTL (IV SOLUTION) ×1 IMPLANT
PACK VAGINAL MINOR WOMEN LF (CUSTOM PROCEDURE TRAY) ×1 IMPLANT
PAD OB MATERNITY 4.3X12.25 (PERSONAL CARE ITEMS) ×1 IMPLANT
SET BERKELEY SUCTION TUBING (SUCTIONS) ×1 IMPLANT
TOWEL GREEN STERILE FF (TOWEL DISPOSABLE) ×1 IMPLANT
UNDERPAD 30X36 HEAVY ABSORB (UNDERPADS AND DIAPERS) ×1 IMPLANT
VACURETTE 10 RIGID CVD (CANNULA) IMPLANT
VACURETTE 7MM CVD STRL WRAP (CANNULA) IMPLANT
VACURETTE 8 RIGID CVD (CANNULA) IMPLANT
VACURETTE 9 RIGID CVD (CANNULA) IMPLANT

## 2023-01-28 NOTE — Op Note (Signed)
Michelle Greer PROCEDURE DATE: 01/28/2023  PREOPERATIVE DIAGNOSIS: late postpartum  retained placenta POSTOPERATIVE DIAGNOSIS: The same PROCEDURE:    ultrasound guided dilation and curettage SURGEON:  Lynnda Shields, MD  INDICATIONS: 27 y.o. [phone] with retained products of conception/placenta 7 days postpartum from vaginal delivery.  needing surgical completion.  Risks of surgery were discussed with the patient including but not limited to: bleeding which may require transfusion; infection which may require antibiotics; injury to uterus or surrounding organs; need for additional procedures including laparotomy or laparoscopy; possibility of intrauterine scarring which may impair future fertility; and other postoperative/anesthesia complications. Written informed consent was obtained.    FINDINGS:  A 13 week size uterus, moderate amounts of products of conception, specimen sent to pathology.  ANESTHESIA: General-LMA,  INTRAVENOUS FLUIDS:  300 ml of LR ESTIMATED BLOOD LOSS: 200 ml. UOP: 75 ml  SPECIMENS:  Products of conception sent to pathology COMPLICATIONS:  None immediate.  PROCEDURE DETAILS:  The patient received intravenous Doxycycline while in the preoperative area.  She was then taken to the operating room where general anesthesia was administered and was found to be adequate.  After an adequate timeout was performed, she was placed in the dorsal lithotomy position and examined; then prepped and draped in the sterile manner.   Her bladder was catheterized for an unmeasured amount of clear, yellow urine. A vaginal speculum was then placed in the patient's vagina and a single tooth tenaculum was applied to the anterior lip of the cervix.  The cervix was already dilated from recent vaginal delivery.  A  9 mm suction curette that was gently advanced to the uterine fundus. Ultrasound guidance was used to target retained Poc/placenta primarily on the posterior wall of the uterus. The suction  device was then activated and curette slowly rotated to clear the uterus of products of conception.  The tissue could be seen separating from the uterine wall  with real time ultrasound.  The suction curette was removed and the posterior wall was sharply curettaged until the endometrial stripe was again thin.  A final suction curettage was done until complete emptying of the uterus was confirmed. There was moderate bleeding noted and the patient received intra-op methergine and also had IV TXA from earlier in the case.  The tenaculum was  removed with good hemostasis noted.   All instruments were removed from the patient's vagina.  Sponge and instrument counts were correct times two  The patient tolerated the procedure well and was taken to the recovery area extubated, awake, and in stable condition.  The patient will be discharged to home as per PACU criteria.  Routine postoperative instructions given.  She was prescribed Oxycodone, Ibuprofen  She will follow up in the office in 2-3 weeks for postoperative evaluation  Lynnda Shields, MD, [place], [facility] for [place]

## 2023-01-28 NOTE — Progress Notes (Signed)
WHen pt returned from u/s she stated her pain was still "10" but she felt some better in that the pain was not as sharp

## 2023-01-28 NOTE — MAU Note (Addendum)
.  Michelle Greer is a 27 y.o. pp here in MAU reporting sharp pain R lower part of abdomen. Pain has been present for couple of days. Having normal BMs and denies n/v. States bleeding is normal with some clots. Did pass something on 3/6 that looked like something was in the blood. (Has pic on phone.)Ibuprofen 600mg  not helping. Baby in NICU and doing well per pt  Onset of complaint: 2days Pain score: 10 Vitals:   01/28/23 0356 01/28/23 0400  BP:  (!) 144/80  Pulse: 88   Resp: 18   Temp: 98.5 F (36.9 C)   SpO2: 100%      FHT:n/a Lab orders placed from triage:  u/a

## 2023-01-28 NOTE — Anesthesia Procedure Notes (Signed)
Procedure Name: LMA Insertion Date/Time: 01/28/2023 11:48 AM  Performed by: Alain Marion, CRNAPre-anesthesia Checklist: Patient identified, Emergency Drugs available, Suction available and Patient being monitored Patient Re-evaluated:Patient Re-evaluated prior to induction Oxygen Delivery Method: Circle System Utilized Preoxygenation: Pre-oxygenation with 100% oxygen Induction Type: IV induction Ventilation: Mask ventilation without difficulty LMA: LMA inserted LMA Size: 4.0 Number of attempts: 1 Airway Equipment and Method: Bite block Placement Confirmation: positive ETCO2 Tube secured with: Tape Dental Injury: Teeth and Oropharynx as per pre-operative assessment

## 2023-01-28 NOTE — Transfer of Care (Signed)
Immediate Anesthesia Transfer of Care Note  Patient: Michelle Greer  Procedure(s) Performed: DILATATION AND EVACUATION  Patient Location: PACU  Anesthesia Type:General  Level of Consciousness: awake, alert , and oriented  Airway & Oxygen Therapy: Patient Spontanous Breathing and Patient connected to face mask oxygen  Post-op Assessment: Report given to RN and Post -op Vital signs reviewed and stable  Post vital signs: Reviewed and stable  Last Vitals:  Vitals Value Taken Time  BP 135/92 01/28/23 1156  Temp    Pulse 75 01/28/23 1200  Resp 19 01/28/23 1200  SpO2 95 % 01/28/23 1200  Vitals shown include unvalidated device data.  Last Pain:  Vitals:   01/28/23 1041  TempSrc: Oral  PainSc:       Patients Stated Pain Goal: 0 ([id number])  Complications: No notable events documented.

## 2023-01-28 NOTE — H&P (Signed)
OB/GYN Pre-Op History and Physical  Janiqua Cabazos is a 27 y.o. [phone] presenting for treatment of possible retained products of conception.  She was seen in the MAU the previous evening, PPD 7, after SVD she/her/hers complains of severe right lower quadrant pain.  She has had sharp, constant pelvic pain the last few days.  She feels she passed some placental tissue on Tuesday.  Delivery summary and initial H and P reviewed.  Per delivery summary it was felt that the placenta was intact, but she did have some brisk post delivery bleeding and was administered TXA and methergine.  During this evaluation, ultrasound was suspicious for retained POC. Pt understands she will have ultrasound guided dilation and evacuation for presumed retained products of conception.      Past Medical History:  Diagnosis Date   Anemia    Bronchitis    Depression    History of postpartum hypertension 05/24/2018   Irregular menstrual bleeding    Trichomonosis     Past Surgical History:  Procedure Laterality Date   WISDOM TOOTH EXTRACTION      OB History  Gravida Para Term Preterm AB Living  '4 3 2 1 1 3  '$ SAB IAB Ectopic Multiple Live Births  1 0 0 0 3    # Outcome Date GA Lbr Len/2nd Weight Sex Delivery Anes PTL Lv  4 Preterm 01/21/23 [id number]:34 / 00:06 1540 g M Vag-Spont EPI  LIV  3 SAB 08/02/21 969w0d       2 Term 12/21/18 [id number] g M Vag-Spont EPI  LIV  1 Term 07/16/13 37w83w3d15 / 00:53 2903 g M Vag-Spont EPI  LIV     Birth Comments: High blood pressure/ postpartum hypertension vs preeclampsia    Social History   Socioeconomic History   Marital status: Single    Spouse name: Not on file   Number of children: Not on file   Years of education: Not on file   Highest education level: Not on file  Occupational History   Not on file  Tobacco Use   Smoking status: Never   Smokeless tobacco: Never  Vaping Use   Vaping Use: Never used  Substance and Sexual Activity   Alcohol use: No    Drug use: No   Sexual activity: Yes    Birth control/protection: None  Other Topics Concern   Not on file  Social History Narrative   Not on file   Social Determinants of Health   Financial Resource Strain: Not on file  Food Insecurity: No Food Insecurity (01/19/2023)   Hunger Vital Sign    Worried About Running Out of Food in the Last Year: Never true    Ran Out of Food in the Last Year: Never true  Recent Concern: Food Insecurity - Food Insecurity Present (11/29/2022)   Hunger Vital Sign    Worried About Running Out of Food in the Last Year: Sometimes true    Ran Out of Food in the Last Year: Sometimes true  Transportation Needs: No Transportation Needs (01/19/2023)   PRAPARE - TranHydrologistdical): No    Lack of Transportation (Non-Medical): No  Recent Concern: Transportation Needs - Unmet Transportation Needs (11/29/2022)   PRAPARE - TranHydrologistdical): Yes    Lack of Transportation (Non-Medical): Yes  Physical Activity: Not on file  Stress: Not on file  Social Connections: Not on file    Family History  Problem Relation Age of Onset   Asthma Mother     Medications Prior to Admission  Medication Sig Dispense Refill Last Dose   ibuprofen (ADVIL) 600 MG tablet Take 1 tablet (600 mg total) by mouth every 6 (six) hours. 30 tablet 0 01/27/2023 at 2200   acetaminophen (TYLENOL) 500 MG tablet Take 2 tablets (1,000 mg total) by mouth every 8 (eight) hours. 30 tablet 0    albuterol (VENTOLIN HFA) 108 (90 Base) MCG/ACT inhaler Inhale 2 puffs into the lungs every 6 (six) hours as needed for wheezing or shortness of breath. 8 g 2    benzocaine-Menthol (DERMOPLAST) 20-0.5 % AERO Apply 1 Application topically as needed for irritation (perineal discomfort). 56 g 0    Blood Pressure Monitoring (BLOOD PRESSURE KIT) DEVI 1 Device by Does not apply route as needed. (Patient not taking: Reported on 11/29/2022) 1 each 0    ferrous  sulfate 325 (65 FE) MG EC tablet Take 1 tablet (325 mg total) by mouth every other day. 30 tablet 2    Prenatal Vit-Fe Fumarate-FA (PRENATAL VITAMIN PO) Take 1 tablet by mouth daily.      sertraline (ZOLOFT) 25 MG tablet Take 1 tablet (25 mg total) by mouth daily for 14 days, THEN 2 tablets (50 mg total) daily. 106 tablet 0     Allergies  Allergen Reactions   Tomato Hives    Review of Systems: Negative except for what is mentioned in HPI.     Physical Exam: BP (!) 137/90   Pulse 69   Temp 98.7 F (37.1 C) (Oral)   Resp 18   Ht '5\' 5"'$  (1.651 m)   Wt 75.8 kg   LMP 04/25/2022   SpO2 100%   BMI 27.79 kg/m  CONSTITUTIONAL: Well-developed, well-nourished female in no acute distress.  HENT:  Normocephalic, atraumatic, External right and left ear normal. Oropharynx is clear and moist EYES: Conjunctivae and EOM are normal. NECK: Normal range of motion, supple, no masses SKIN: Skin is warm and dry. No rash noted. Not diaphoretic. No erythema. No pallor. [place]: Alert and oriented to person, place, and time. Normal reflexes, muscle tone coordination. No cranial nerve deficit noted. PSYCHIATRIC: Normal mood and affect. Normal behavior. Normal judgment and thought content. CARDIOVASCULAR: Normal heart rate noted, regular rhythm RESPIRATORY: Effort and breath sounds normal, no problems with respiration noted ABDOMEN: Soft, nontender, nondistended. PELVIC: Deferred MUSCULOSKELETAL: Normal range of motion. No edema and no tenderness. 2+ distal pulses.   Pertinent Labs/Studies:   Results for orders placed or performed during the hospital encounter of 01/28/23 (from the past 72 hour(s))  Urinalysis, Routine w reflex microscopic -Urine, Clean Catch     Status: Abnormal   Collection Time: 01/28/23  4:04 AM  Result Value Ref Range   Color, Urine YELLOW YELLOW   APPearance HAZY (A) CLEAR   Specific Gravity, Urine 1.018 1.005 - 1.030   pH 6.0 5.0 - 8.0   Glucose, UA NEGATIVE NEGATIVE  mg/dL   Hgb urine dipstick LARGE (A) NEGATIVE   Bilirubin Urine NEGATIVE NEGATIVE   Ketones, ur NEGATIVE NEGATIVE mg/dL   Protein, ur NEGATIVE NEGATIVE mg/dL   Nitrite NEGATIVE NEGATIVE   Leukocytes,Ua MODERATE (A) NEGATIVE   RBC / HPF 21-50 0 - 5 RBC/hpf   WBC, UA 6-10 0 - 5 WBC/hpf   Bacteria, UA RARE (A) NONE SEEN   Squamous Epithelial / HPF 0-5 0 - 5 /HPF   Mucus PRESENT    Hyaline Casts, UA PRESENT  Comment: Performed at [facility], [place] [address] [place], [place] [address]  CBC with Differential/Platelet     Status: Abnormal   Collection Time: 01/28/23  4:43 AM  Result Value Ref Range   WBC 8.2 4.0 - 10.5 K/uL   RBC 3.79 (L) 3.87 - 5.11 MIL/uL   Hemoglobin 10.4 (L) 12.0 - 15.0 g/dL   HCT 32.4 (L) 36.0 - 46.0 %   MCV 85.5 80.0 - 100.0 fL   MCH 27.4 26.0 - 34.0 pg   MCHC 32.1 30.0 - 36.0 g/dL   RDW 18.3 (H) 11.5 - 15.5 %   Platelets 416 (H) 150 - 400 K/uL   nRBC 0.0 0.0 - 0.2 %   Neutrophils Relative % 53 %   Neutro Abs 4.4 1.7 - 7.7 K/uL   Lymphocytes Relative 27 %   Lymphs Abs 2.2 0.7 - 4.0 K/uL   Monocytes Relative 11 %   Monocytes Absolute 0.9 0.1 - 1.0 K/uL   Eosinophils Relative 4 %   Eosinophils Absolute 0.3 0.0 - 0.5 K/uL   Basophils Relative 1 %   Basophils Absolute 0.1 0.0 - 0.1 K/uL   Immature Granulocytes 4 %   Abs Immature Granulocytes 0.29 (H) 0.00 - 0.07 K/uL    Comment: Performed at [facility] [address], [place], [place] [address]  Type and screen [place]     Status: None   Collection Time: 01/28/23  7:57 AM  Result Value Ref Range   ABO/RH(D) O POS    Antibody Screen NEG    Sample Expiration      01/31/2023,2359 Performed at [facility], [place] [address], [place], [place] [address]        Assessment and Plan :Janylah Humphery is a 27 y.o. [id number] here for suspicion of retained products of conception..   Plan for suction dilation and curettage with ultrasound guidance NPO Admission  labs ordered VS Q4   Risks and benefits of the procedure given including bleeding, infection, involvement of other organs as well as uterine perforation.   Lynnda Shields, M.D. Attending [place], [facility] for [employer], [place]

## 2023-01-28 NOTE — H&P (Signed)
[facility] History and Physical  Michelle Greer is a 27 y.o. [phone] who is PPD7 s/p SVD at 68w5dwho presented to MAU today for evaluation of severe R lower abdominal pain. Pain started 2 days ago. Has had regular bowel movements, passing flatus and denies nausea/vomiting. Has had sweats with cold chills at night for the past few nights but has otherwise felt good. Pain is sharp, constant and worse with pressing or letting go. Is only partially comfortable curled up on her left side.    Vaginal bleeding currently less/stable, but passed a piece of tissue on Tuesday that resembled placental tissue or membranes.    Pregnancy Course: [facility] delivered at 378w5due to severe IUGR. Has a history of postpartum hypertension in a previous pregnancy, did not meet criteria for hypertension while in the hospital with this baby.  NPO since 11pm on 3/7 (last ate pizza)  Past Medical History:  Diagnosis Date   Anemia    Bronchitis    Depression    History of postpartum hypertension 05/24/2018   Irregular menstrual bleeding    Trichomonosis    Past Surgical History:  Procedure Laterality Date   WISDOM TOOTH EXTRACTION     OB History  Gravida Para Term Preterm AB Living  '4 3 2 1 1 3  '$ SAB IAB Ectopic Multiple Live Births  1 0 0 0 3    # Outcome Date GA Lbr Len/2nd Weight Sex Delivery Anes PTL Lv  4 Preterm 01/21/23 [id number]:34 / 00:06 1540 g M Vag-Spont EPI  LIV  3 SAB 08/02/21 9w069w0d     2 Term 12/21/18 [id number] g M Vag-Spont EPI  LIV  1 Term 07/16/13 63w3d38w3d / 00:53 2903 g M Vag-Spont EPI  LIV     Birth Comments: High blood pressure/ postpartum hypertension vs preeclampsia  Patient denies any other pertinent gynecologic issues.  No current facility-administered medications on file prior to encounter.   Current Outpatient Medications on File Prior to Encounter  Medication Sig Dispense Refill   ibuprofen (ADVIL) 600 MG  tablet Take 1 tablet (600 mg total) by mouth every 6 (six) hours. 30 tablet 0   acetaminophen (TYLENOL) 500 MG tablet Take 2 tablets (1,000 mg total) by mouth every 8 (eight) hours. 30 tablet 0   albuterol (VENTOLIN HFA) 108 (90 Base) MCG/ACT inhaler Inhale 2 puffs into the lungs every 6 (six) hours as needed for wheezing or shortness of breath. 8 g 2   benzocaine-Menthol (DERMOPLAST) 20-0.5 % AERO Apply 1 Application topically as needed for irritation (perineal discomfort). 56 g 0   Blood Pressure Monitoring (BLOOD PRESSURE KIT) DEVI 1 Device by Does not apply route as needed. (Patient not taking: Reported on 11/29/2022) 1 each 0   ferrous sulfate 325 (65 FE) MG EC tablet Take 1 tablet (325 mg total) by mouth every other day. 30 tablet 2   Prenatal Vit-Fe Fumarate-FA (PRENATAL VITAMIN PO) Take 1 tablet by mouth daily.     sertraline (ZOLOFT) 25 MG tablet Take 1 tablet (25 mg total) by mouth daily for 14 days, THEN 2 tablets (50 mg total) daily. 106 tablet 0   Allergies  Allergen Reactions   Tomato Hives    Social History:   reports that she has never smoked. She has never used smokeless tobacco. She reports that she does not drink alcohol and does not use drugs. Family History  Problem Relation Age  of Onset   Asthma Mother     Review of Systems: Pertinent items noted in HPI and remainder of comprehensive ROS otherwise negative.  PHYSICAL EXAM: Blood pressure (!) 143/79, pulse 74, temperature 98.4 F (36.9 C), temperature source Oral, resp. rate 17, height '5\' 6"'$  (1.676 m), weight 75.8 kg, last menstrual period 04/25/2022, SpO2 99 %, unknown if currently breastfeeding. CONSTITUTIONAL: Well-developed, well-nourished female in no acute distress.  HENT:  Normocephalic, atraumatic,  SKIN: Skin is warm and dry. No rash noted. Not diaphoretic. No erythema. No pallor. NEUROLOGIC: Alert and oriented to person, place, and time.  PSYCHIATRIC: Normal mood and affect. Normal behavior. Normal judgment  and thought content. CARDIOVASCULAR: Normal heart rate noted RESPIRATORY: Effort normal, no problems with respiration noted ABDOMEN: Soft, +lower abdominal tenderness without rebound or guarding. No fundal or CVA tenderness. PELVIC: Deferred  Labs: Results for orders placed or performed during the hospital encounter of 01/28/23 (from the past 336 hour(s))  Urinalysis, Routine w reflex microscopic -Urine, Clean Catch   Collection Time: 01/28/23  4:04 AM  Result Value Ref Range   Color, Urine YELLOW YELLOW   APPearance HAZY (A) CLEAR   Specific Gravity, Urine 1.018 1.005 - 1.030   pH 6.0 5.0 - 8.0   Glucose, UA NEGATIVE NEGATIVE mg/dL   Hgb urine dipstick LARGE (A) NEGATIVE   Bilirubin Urine NEGATIVE NEGATIVE   Ketones, ur NEGATIVE NEGATIVE mg/dL   Protein, ur NEGATIVE NEGATIVE mg/dL   Nitrite NEGATIVE NEGATIVE   Leukocytes,Ua MODERATE (A) NEGATIVE   RBC / HPF 21-50 0 - 5 RBC/hpf   WBC, UA 6-10 0 - 5 WBC/hpf   Bacteria, UA RARE (A) NONE SEEN   Squamous Epithelial / HPF 0-5 0 - 5 /HPF   Mucus PRESENT    Hyaline Casts, UA PRESENT   CBC with Differential/Platelet   Collection Time: 01/28/23  4:43 AM  Result Value Ref Range   WBC 8.2 4.0 - 10.5 K/uL   RBC 3.79 (L) 3.87 - 5.11 MIL/uL   Hemoglobin 10.4 (L) 12.0 - 15.0 g/dL   HCT 32.4 (L) 36.0 - 46.0 %   MCV 85.5 80.0 - 100.0 fL   MCH 27.4 26.0 - 34.0 pg   MCHC 32.1 30.0 - 36.0 g/dL   RDW 18.3 (H) 11.5 - 15.5 %   Platelets 416 (H) 150 - 400 K/uL   nRBC 0.0 0.0 - 0.2 %   Neutrophils Relative % 53 %   Neutro Abs 4.4 1.7 - 7.7 K/uL   Lymphocytes Relative 27 %   Lymphs Abs 2.2 0.7 - 4.0 K/uL   Monocytes Relative 11 %   Monocytes Absolute 0.9 0.1 - 1.0 K/uL   Eosinophils Relative 4 %   Eosinophils Absolute 0.3 0.0 - 0.5 K/uL   Basophils Relative 1 %   Basophils Absolute 0.1 0.0 - 0.1 K/uL   Immature Granulocytes 4 %   Abs Immature Granulocytes 0.29 (H) 0.00 - 0.07 K/uL  Type and screen [place]   Collection  Time: 01/28/23  7:57 AM  Result Value Ref Range   ABO/RH(D) O POS    Antibody Screen NEG    Sample Expiration      01/31/2023,2359 Performed at [facility], [address] [place], [place] [address]   Results for orders placed or performed during the hospital encounter of 01/19/23 (from the past 336 hour(s))  Urinalysis, Routine w reflex microscopic -Urine, Clean Catch   Collection Time: 01/19/23  6:15 PM  Result  Value Ref Range   Color, Urine YELLOW YELLOW   APPearance CLEAR CLEAR   Specific Gravity, Urine 1.018 1.005 - 1.030   pH 6.0 5.0 - 8.0   Glucose, UA NEGATIVE NEGATIVE mg/dL   Hgb urine dipstick NEGATIVE NEGATIVE   Bilirubin Urine NEGATIVE NEGATIVE   Ketones, ur 20 (A) NEGATIVE mg/dL   Protein, ur 30 (A) NEGATIVE mg/dL   Nitrite NEGATIVE NEGATIVE   Leukocytes,Ua NEGATIVE NEGATIVE   RBC / HPF 0-5 0 - 5 RBC/hpf   WBC, UA 0-5 0 - 5 WBC/hpf   Bacteria, UA RARE (A) NONE SEEN   Squamous Epithelial / HPF 0-5 0 - 5 /HPF   Mucus PRESENT   Type and screen [facility]   Collection Time: 01/19/23  7:03 PM  Result Value Ref Range   ABO/RH(D) O POS    Antibody Screen NEG    Sample Expiration      01/22/2023,2359 Performed at [facility], [place] [address], [place], [place] [address]   CBC   Collection Time: 01/19/23  7:05 PM  Result Value Ref Range   WBC 8.7 4.0 - 10.5 K/uL   RBC 3.56 (L) 3.87 - 5.11 MIL/uL   Hemoglobin 9.5 (L) 12.0 - 15.0 g/dL   HCT 29.1 (L) 36.0 - 46.0 %   MCV 81.7 80.0 - 100.0 fL   MCH 26.7 26.0 - 34.0 pg   MCHC 32.6 30.0 - 36.0 g/dL   RDW 14.4 11.5 - 15.5 %   Platelets 314 150 - 400 K/uL   nRBC 0.0 0.0 - 0.2 %  CBC   Collection Time: 01/21/23  7:25 AM  Result Value Ref Range   WBC 15.5 (H) 4.0 - 10.5 K/uL   RBC 3.38 (L) 3.87 - 5.11 MIL/uL   Hemoglobin 8.8 (L) 12.0 - 15.0 g/dL   HCT 27.9 (L) 36.0 - 46.0 %   MCV 82.5 80.0 - 100.0 fL   MCH 26.0 26.0 - 34.0 pg   MCHC 31.5 30.0 - 36.0 g/dL   RDW 14.4 11.5 - 15.5 %    Platelets 313 150 - 400 K/uL   nRBC 0.3 (H) 0.0 - 0.2 %  Surgical pathology   Collection Time: 01/21/23 12:39 PM  Result Value Ref Range   SURGICAL PATHOLOGY      SURGICAL PATHOLOGY CASE: [id number] PATIENT: [facility] Surgical Pathology Report     Clinical History: 34w 5d; severe IUGR (crm)     FINAL MICROSCOPIC DIAGNOSIS:  A. PLACENTA, SINGLETON, DELIVERY: Preterm placenta, 510 g Three-vessel umbilical cord   GROSS DESCRIPTION:  Specimen received: Fresh Size and shape: Discoid singleton placenta measuring 17.1 x 16.1 x 3.5 cm Weight: [id number] g Umbilical cord: Trivascular umbilical cord is tan-yellow, rubbery, focally edematous, 21.8 cm in length, averages 1.5 cm in diameter, and inserts 6.0 cm to the closest placental margin Membranes: Membranes display a circumvallate insertion, are tan-pink, smooth, focally thickened and opaque Fetal surface: Tan-pink, smooth, with a normal vascular pattern, and moderate subchorionic fibrin deposition Maternal surface: Maternal surface is focally disrupted and appears incomplete with tan-brown cotyledons, minimal calcifications, and a minimal amount of adherent red-brown cl otted blood Cut surface: Spongy tan-red, and no lesions are grossly identified Block summary: 4 blocks submitted 1 = umbilical cord and membranes 2-4 = placenta  Craig Staggers 01/22/2023)    Final Diagnosis performed by Claudette Laws, MD.   Electronically signed 01/24/2023 Technical component performed at [facility], [place] [address], [place], [place] [address].  Professional component performed at [employer]. [facility], [place] [address], [place], [place] [address].  Immunohistochemistry Technical component (if applicable) was performed at [facility]. [address] [place], [place], [place] [address].   IMMUNOHISTOCHEMISTRY DISCLAIMER (if applicable): Some of these immunohistochemical stains may have been developed and  the performance characteristics determine by [facility]. Some may not have been cleared or approved by the U.S. Food and Drug Administration. The FDA has determined that such clearance or approval is no t necessary. This test is used for clinical purposes. It should not be regarded as investigational or for research. This laboratory is certified under the [place] (CLIA-88) as qualified to perform high complexity clinical laboratory testing.  The controls stained appropriately.   CBC   Collection Time: 01/22/23  5:59 AM  Result Value Ref Range   WBC 18.0 (H) 4.0 - 10.5 K/uL   RBC 3.09 (L) 3.87 - 5.11 MIL/uL   Hemoglobin 8.0 (L) 12.0 - 15.0 g/dL   HCT 25.6 (L) 36.0 - 46.0 %   MCV 82.8 80.0 - 100.0 fL   MCH 25.9 (L) 26.0 - 34.0 pg   MCHC 31.3 30.0 - 36.0 g/dL   RDW 14.6 11.5 - 15.5 %   Platelets 305 150 - 400 K/uL   nRBC 0.2 0.0 - 0.2 %  Results for orders placed or performed in visit on 01/17/23 (from the past 336 hour(s))  RPR   Collection Time: 01/17/23  9:56 AM  Result Value Ref Range   RPR Ser Ql Non Reactive Non Reactive  HIV Antibody (routine testing w rflx)   Collection Time: 01/17/23  9:56 AM  Result Value Ref Range   HIV Screen 4th Generation wRfx Non Reactive Non Reactive  Glucose Tolerance, 2 Hours w/1 Hour   Collection Time: 01/17/23  9:56 AM  Result Value Ref Range   Glucose, Fasting 76 70 - 91 mg/dL   Glucose, 1 hour 148 70 - 179 mg/dL   Glucose, 2 hour 107 70 - 152 mg/dL  CBC   Collection Time: 01/17/23  9:56 AM  Result Value Ref Range   WBC 6.1 3.4 - 10.8 x10E3/uL   RBC 3.55 (L) 3.77 - 5.28 x10E6/uL   Hemoglobin 9.3 (L) 11.1 - 15.9 g/dL   Hematocrit 28.7 (L) 34.0 - 46.6 %   MCV 81 79 - 97 fL   MCH 26.2 (L) 26.6 - 33.0 pg   MCHC 32.4 31.5 - 35.7 g/dL   RDW 13.6 11.7 - 15.4 %   Platelets 299 150 - 450 x10E3/uL    Imaging Studies: US PELVIC COMPLETE WITH TRANSVAGINAL  Result Date:  01/28/2023 CLINICAL DATA:  Postpartum patient with normal vaginal delivery 01/21/2023, presents for evaluation of pelvic pain. [id number]. EXAM: TRANSABDOMINAL ULTRASOUND OF PELVIS COLOR DOPPLER ULTRASOUND OF OVARIES TECHNIQUE: Transabdominal ultrasound examination of the pelvis was performed including evaluation of the uterus, ovaries, adnexal regions, and pelvic cul-de-sac. Color and duplex Doppler ultrasound was utilized to evaluate blood flow to the ovaries. COMPARISON:  Almost all of the patient's recent studies are Ob ultrasounds. There is a non OB pelvic ultrasound available from 06/02/2017. FINDINGS: Uterus Measurements: 12.7 x 9.3 x 10.0 cm = volume: 622.7 mL. No fibroids or other mass visualized. The uterus is still enlarged from recent pregnancy with slight hyperemia of its wall. The cervix is closed and is unremarkable. Endometrium Thickness: 2.8 cm. The endometrial contents are complex and heterogeneous with a few tiny cystic  spaces but mostly solid content, with scattered areas of vascularity. Findings are worrisome for retained products of conception. Right ovary Measurements: 2.9 x 1.7 x 2.5 cm = volume: 6.6 mL. Normal appearance/no adnexal mass. Left ovary Measurements: 3.6 x 2.5 x 3.1 cm = volume: 14.1 mL. Normal appearance/no adnexal mass. Color doppler evaluation demonstrates normal appearing color flow. Other: No free fluid is seen and no paraovarian mass is evident. IMPRESSION: 1. Enlarged uterus with complex heterogeneous endometrial material with scattered areas of vascularity. Findings are worrisome for retained products of conception. The cervix is closed and unremarkable. 2. Normal appearing ovaries with no adnexal mass or free fluid. Electronically Signed   By: Telford Nab M.D.   On: 01/28/2023 05:27   Assessment: Active Problems:   Retained products of conception after delivery without hemorrhage  Plan: Patient counseled on diagnosis. Offered misoprostol vs MVA in MAU vs OR suction  D&C. Reviewed r/b of each option and she would prefer suction D&C in OR.  Reviewed risks of D&C include but are not limited to pain, infection, bleeding requiring transfusion, uterine scarring, uterine perforation, injury to surrounding organs, needs for additional procedures or surgeries, VTE, anesthesia reaction, or medical complications. She is accepting of these risks.   OR orders entered and case added on for 12:30pm. Pt will be admitted to [facility] to await her scheduled case.  Pre op abx not indicated Consents to be signed by Dr. Kyla Balzarine, [place], [facility] for [facility], [place]

## 2023-01-28 NOTE — Lactation Note (Signed)
This note was copied from a baby's chart.  NICU Lactation Consultation Note  Patient Name: Boy Pavneet Lisboa [id number] Date: 01/28/2023 Age:29 days  Reason for consult: Follow-up assessment; Late-preterm 34-36.6wks; Infant < 6lbs   Subjective LC in to visit with P3 Mom of LPTI in the NICU.  Mom had severe pelvic pain that caused her to be transferred from baby's room to MAU for evaluation.  Currently in [facility] waiting on a D&E for suspected placental fragment retention.  Mom agreeable to pump as she last pumped last evening.  She has been expressing 240 ml combined and pumping every 3 hrs, except at night.    Breasts are firm.  DEBP set up and assisted with pumping and milk is flowing well.  Mom instructed to ask for ice packs if milk isn't flowing and breasts are not softening.    Mom is hoping to be discharged following D&E to baby's room.   Messaged NICU RN about tubing breastmilk labels for Mom's milk  Objective Infant data:  Infant feeding assessment Scale for Readiness: 1     Maternal data: [id number]  Vaginal, Spontaneous Pumping frequency: Mom had been pumping every 3 hrs, until re-admission this am to [facility] for severe pelvic pain Pumped volume: 120 mL (-240 ml) Flange Size: 24   WIC Program: Yes WIC Referral Sent?: Yes  Assessment Infant: Maternal: Milk volume: Abundant   Intervention/Plan Interventions: Skin to skin; Breast massage; Hand express; DEBP; Education  Tools: Pump; Flanges Pump Education: Setup, frequency, and cleaning; Milk Storage  Plan: Consult Status: NICU follow-up  NICU Follow-up type: Weekly NICU follow up; Verify absence of engorgement  Discharge Education: Engorgement and breast care    Teirra, Behnken 01/28/2023, 9:39 AM

## 2023-01-28 NOTE — MAU Provider Note (Signed)
Chief Complaint:  Abdominal Pain   Event Date/Time   First Provider Initiated Contact with Patient 01/28/23 0416     HPI: Michelle Greer is a 27 y.o. [id number] on PPD#7 who presents to maternity admissions reporting severe right lower abdominal pain x2 days. Has had regular bowel movements, passing flatus and denies nausea/vomiting. Has had sweats with cold chills at night for the past few nights but has otherwise felt good. Pain is sharp, constant and worse with pressing or letting go. Is only partially comfortable curled up on her left side.   Vaginal bleeding currently less/stable, but passed a piece of tissue on Tuesday that resembled placental tissue or membranes.   Pregnancy Course: Received prenatal care at [facility], delivered at 14w5ddue to severe IUGR. Has a history of postpartum hypertension in a previous pregnancy, did not meet criteria for hypertension while in the hospital with this baby.  Past Medical History:  Diagnosis Date   Anemia    Bronchitis    Depression    History of postpartum hypertension 05/24/2018   Irregular menstrual bleeding    Trichomonosis    OB History  Gravida Para Term Preterm AB Living  '4 3 2 1 1 3  '$ SAB IAB Ectopic Multiple Live Births  1 0 0 0 3    # Outcome Date GA Lbr Len/2nd Weight Sex Delivery Anes PTL Lv  4 Preterm 01/21/23 364w5d4:34 / 00:06 3 lb 6.3 oz (1.54 kg) M Vag-Spont EPI  LIV  3 SAB 08/02/21 9w52w0d      2 Term 12/21/18 [id number] 9 oz (2.977 kg) M Vag-Spont EPI  LIV  1 Term 07/16/13 [id number] / 00:53 6 lb 6.4 oz (2.903 kg) M Vag-Spont EPI  LIV     Birth Comments: High blood pressure/ postpartum hypertension vs preeclampsia   Past Surgical History:  Procedure Laterality Date   WISDOM TOOTH EXTRACTION     Family History  Problem Relation Age of Onset   Asthma Mother    Social History   Tobacco Use   Smoking status: Never   Smokeless tobacco: Never  Vaping Use   Vaping Use: Never used  Substance Use Topics    Alcohol use: No   Drug use: No   Allergies  Allergen Reactions   Tomato Hives   Medications Prior to Admission  Medication Sig Dispense Refill Last Dose   ibuprofen (ADVIL) 600 MG tablet Take 1 tablet (600 mg total) by mouth every 6 (six) hours. 30 tablet 0 01/27/2023 at 2200   acetaminophen (TYLENOL) 500 MG tablet Take 2 tablets (1,000 mg total) by mouth every 8 (eight) hours. 30 tablet 0    albuterol (VENTOLIN HFA) 108 (90 Base) MCG/ACT inhaler Inhale 2 puffs into the lungs every 6 (six) hours as needed for wheezing or shortness of breath. 8 g 2    benzocaine-Menthol (DERMOPLAST) 20-0.5 % AERO Apply 1 Application topically as needed for irritation (perineal discomfort). 56 g 0    Blood Pressure Monitoring (BLOOD PRESSURE KIT) DEVI 1 Device by Does not apply route as needed. (Patient not taking: Reported on 11/29/2022) 1 each 0    ferrous sulfate 325 (65 FE) MG EC tablet Take 1 tablet (325 mg total) by mouth every other day. 30 tablet 2    Prenatal Vit-Fe Fumarate-FA (PRENATAL VITAMIN PO) Take 1 tablet by mouth daily.      sertraline (ZOLOFT) 25 MG tablet Take 1 tablet (25 mg total) by mouth daily  for 14 days, THEN 2 tablets (50 mg total) daily. 106 tablet 0    I have reviewed patient's Past Medical Hx, Surgical Hx, Family Hx, Social Hx, medications and allergies.   ROS:  Pertinent items noted in HPI and remainder of comprehensive ROS otherwise negative.   Physical Exam  Patient Vitals for the past 24 hrs:  BP Temp Pulse Resp SpO2 Height Weight  01/28/23 0400 (!) 144/80 -- -- -- -- -- --  01/28/23 0356 -- 98.5 F (36.9 C) 88 18 100 % '5\' 6"'$  (1.676 m) 167 lb (75.8 kg)   Constitutional: Well-developed, well-nourished female in acute distress.  Cardiovascular: normal rate & rhythm, warm and well-perfused Respiratory: normal effort, no problems with respiration noted GI: Abd soft, acutely tender in the right lower quadrant, both with palpation and after MS: Extremities nontender, no edema,  normal ROM Neurologic: Alert and oriented x 4.  GU: no CVA tenderness Pelvic: exam deferred  Labs: Results for orders placed or performed during the hospital encounter of 01/28/23 (from the past 24 hour(s))  Urinalysis, Routine w reflex microscopic -Urine, Clean Catch     Status: Abnormal   Collection Time: 01/28/23  4:04 AM  Result Value Ref Range   Color, Urine YELLOW YELLOW   APPearance HAZY (A) CLEAR   Specific Gravity, Urine 1.018 1.005 - 1.030   pH 6.0 5.0 - 8.0   Glucose, UA NEGATIVE NEGATIVE mg/dL   Hgb urine dipstick LARGE (A) NEGATIVE   Bilirubin Urine NEGATIVE NEGATIVE   Ketones, ur NEGATIVE NEGATIVE mg/dL   Protein, ur NEGATIVE NEGATIVE mg/dL   Nitrite NEGATIVE NEGATIVE   Leukocytes,Ua MODERATE (A) NEGATIVE   RBC / HPF 21-50 0 - 5 RBC/hpf   WBC, UA 6-10 0 - 5 WBC/hpf   Bacteria, UA RARE (A) NONE SEEN   Squamous Epithelial / HPF 0-5 0 - 5 /HPF   Mucus PRESENT    Hyaline Casts, UA PRESENT   CBC with Differential/Platelet     Status: Abnormal   Collection Time: 01/28/23  4:43 AM  Result Value Ref Range   WBC 8.2 4.0 - 10.5 K/uL   RBC 3.79 (L) 3.87 - 5.11 MIL/uL   Hemoglobin 10.4 (L) 12.0 - 15.0 g/dL   HCT 32.4 (L) 36.0 - 46.0 %   MCV 85.5 80.0 - 100.0 fL   MCH 27.4 26.0 - 34.0 pg   MCHC 32.1 30.0 - 36.0 g/dL   RDW 18.3 (H) 11.5 - 15.5 %   Platelets 416 (H) 150 - 400 K/uL   nRBC 0.0 0.0 - 0.2 %   Neutrophils Relative % 53 %   Neutro Abs 4.4 1.7 - 7.7 K/uL   Lymphocytes Relative 27 %   Lymphs Abs 2.2 0.7 - 4.0 K/uL   Monocytes Relative 11 %   Monocytes Absolute 0.9 0.1 - 1.0 K/uL   Eosinophils Relative 4 %   Eosinophils Absolute 0.3 0.0 - 0.5 K/uL   Basophils Relative 1 %   Basophils Absolute 0.1 0.0 - 0.1 K/uL   Immature Granulocytes 4 %   Abs Immature Granulocytes 0.29 (H) 0.00 - 0.07 K/uL   Imaging:  US PELVIC COMPLETE WITH TRANSVAGINAL  Result Date: 01/28/2023 CLINICAL DATA:  Postpartum patient with normal vaginal delivery 01/21/2023, presents for  evaluation of pelvic pain. [id number]. EXAM: TRANSABDOMINAL ULTRASOUND OF PELVIS COLOR DOPPLER ULTRASOUND OF OVARIES TECHNIQUE: Transabdominal ultrasound examination of the pelvis was performed including evaluation of the uterus, ovaries, adnexal regions, and pelvic cul-de-sac. Color and duplex Doppler ultrasound  was utilized to evaluate blood flow to the ovaries. COMPARISON:  Almost all of the patient's recent studies are Ob ultrasounds. There is a non OB pelvic ultrasound available from 06/02/2017. FINDINGS: Uterus Measurements: 12.7 x 9.3 x 10.0 cm = volume: 622.7 mL. No fibroids or other mass visualized. The uterus is still enlarged from recent pregnancy with slight hyperemia of its wall. The cervix is closed and is unremarkable. Endometrium Thickness: 2.8 cm. The endometrial contents are complex and heterogeneous with a few tiny cystic spaces but mostly solid content, with scattered areas of vascularity. Findings are worrisome for retained products of conception. Right ovary Measurements: 2.9 x 1.7 x 2.5 cm = volume: 6.6 mL. Normal appearance/no adnexal mass. Left ovary Measurements: 3.6 x 2.5 x 3.1 cm = volume: 14.1 mL. Normal appearance/no adnexal mass. Color doppler evaluation demonstrates normal appearing color flow. Other: No free fluid is seen and no paraovarian mass is evident. IMPRESSION: 1. Enlarged uterus with complex heterogeneous endometrial material with scattered areas of vascularity. Findings are worrisome for retained products of conception. The cervix is closed and unremarkable. 2. Normal appearing ovaries with no adnexal mass or free fluid. Electronically Signed   By: Telford Nab M.D.   On: 01/28/2023 05:27    MAU Course: Orders Placed This Encounter  Procedures   Culture, OB Urine   US PELVIC COMPLETE WITH TRANSVAGINAL   Urinalysis, Routine w reflex microscopic -Urine, Clean Catch   CBC with Differential/Platelet   Meds ordered this encounter  Medications   lactated ringers bolus  1,000 mL   HYDROmorphone (DILAUDID) injection 1 mg   Dilaudid given for pain control and pt sent to ultrasound. CBC improved but U/S showed retained products of conception. Called report to Dr. Mikel Cella who reviewed and came to bedside to review options with patient. Patient opted for a suction D&E which was scheduled for 12:30pm today. Will be admitted for pain control until surgery.  Assessment: 1. Retained products of conception, postpartum    Plan: Admit to antenatal for D&E at 12:30pm today Care turned over to Dr. Fredda Hammed, CNM, MSN, [facility] Certified Nurse Midwife, [place]

## 2023-01-28 NOTE — Anesthesia Postprocedure Evaluation (Signed)
Anesthesia Post Note  Patient: Tauri Fandrich  Procedure(s) Performed: DILATATION AND EVACUATION     Patient location during evaluation: PACU Anesthesia Type: General Level of consciousness: awake and alert, patient cooperative and oriented Pain management: pain level controlled Vital Signs Assessment: post-procedure vital signs reviewed and stable Respiratory status: spontaneous breathing, nonlabored ventilation and respiratory function stable Cardiovascular status: blood pressure returned to baseline and stable Postop Assessment: no apparent nausea or vomiting and adequate PO intake Anesthetic complications: no   No notable events documented.  Last Vitals:  Vitals:   01/28/23 1211 01/28/23 1226  BP: 135/86   Pulse: 69 70  Resp: 17 17  Temp:    SpO2: 94% 93%    Last Pain:  Vitals:   01/28/23 1156  TempSrc:   PainSc: 0-No pain                 Charlize Hathaway,E. Chris Narasimhan

## 2023-01-28 NOTE — Anesthesia Preprocedure Evaluation (Addendum)
Anesthesia Evaluation  Patient identified by MRN, date of birth, ID band Patient awake    Reviewed: Allergy & Precautions, NPO status , Patient's Chart, lab work & pertinent test results  History of Anesthesia Complications Negative for: history of anesthetic complications  Airway Mallampati: I  TM Distance: >3 FB Neck ROM: Full    Dental  (+) Chipped, Dental Advisory Given   Pulmonary neg pulmonary ROS   breath sounds clear to auscultation       Cardiovascular negative cardio ROS  Rhythm:Regular Rate:Normal     Neuro/Psych    Depression    negative neurological ROS     GI/Hepatic negative GI ROS, Neg liver ROS,,,  Endo/Other  negative endocrine ROS    Renal/GU negative Renal ROS     Musculoskeletal   Abdominal   Peds  Hematology  (+) Blood dyscrasia (Hb 10.4), anemia   Anesthesia Other Findings   Reproductive/Obstetrics (+) Breast feeding  Retained products of conception                             Anesthesia Physical Anesthesia Plan  ASA: 2  Anesthesia Plan: General   Post-op Pain Management: Tylenol PO (pre-op)*   Induction: Intravenous  PONV Risk Score and Plan: 3 and Ondansetron, Dexamethasone and Scopolamine patch - Pre-op  Airway Management Planned: LMA  Additional Equipment:   Intra-op Plan:   Post-operative Plan:   Informed Consent: I have reviewed the patients History and Physical, chart, labs and discussed the procedure including the risks, benefits and alternatives for the proposed anesthesia with the patient or authorized representative who has indicated his/her understanding and acceptance.     Dental advisory given  Plan Discussed with: CRNA and Surgeon  Anesthesia Plan Comments:         Anesthesia Quick Evaluation

## 2023-01-29 ENCOUNTER — Encounter (HOSPITAL_COMMUNITY): Payer: Self-pay | Admitting: Obstetrics and Gynecology

## 2023-01-29 LAB — CULTURE, OB URINE: Culture: <10000 — AB

## 2023-01-31 LAB — SURGICAL PATHOLOGY

## 2023-01-31 NOTE — BH Specialist Note (Signed)
Pt did not arrive to video visit and did not answer the phone; Left HIPPA-compliant message to call back Attila Mccarthy from [facility] at  [phone] (Layson Bertsch's office).  ?; left MyChart message for patient.  ? ?

## 2023-02-01 ENCOUNTER — Telehealth (HOSPITAL_COMMUNITY): Payer: Self-pay | Admitting: *Deleted

## 2023-02-01 ENCOUNTER — Encounter: Payer: Medicaid Other | Admitting: Obstetrics and Gynecology

## 2023-02-01 NOTE — Telephone Encounter (Signed)
Mom reports feeling good. No concerns about herself at this time. EPDS not completed. Mom reports [facility]BH appt in March. [facility] score=17) Mom reports baby is doing well in the NICU.  Odis Hollingshead, RN 02-01-2023 at 2:33pm

## 2023-02-02 ENCOUNTER — Other Ambulatory Visit: Payer: Self-pay | Admitting: Family Medicine

## 2023-02-02 DIAGNOSIS — F321 Major depressive disorder, single episode, moderate: Secondary | ICD-10-CM

## 2023-02-02 DIAGNOSIS — F418 Other specified anxiety disorders: Secondary | ICD-10-CM

## 2023-02-09 MED ORDER — PROPOFOL 1000 MG/100ML IV EMUL
INTRAVENOUS | Status: AC
  Filled 2023-02-09: qty 300

## 2023-02-09 MED ORDER — PHENYLEPHRINE HCL-NACL 20-0.9 MG/250ML-% IV SOLN
INTRAVENOUS | Status: AC
  Filled 2023-02-09: qty 250

## 2023-02-09 NOTE — Lactation Note (Signed)
This note was copied from a baby's chart.  NICU Lactation Consultation Note  Patient Name: Michelle Greer [id number] Date: 02/09/2023 Age:29 wk.o.  Reason for consult: Weekly NICU follow-up; Late-preterm 34-36.6wks; Infant < 6lbs; NICU baby  Subjective Visited with family of 73 70/77 week old ATA NICU female; Michelle Greer is a P3 and reports that her supply has dwindled. Noticed she's not pumping consistently, she voiced she's always pumped 2-3 times/day since baby's birth and that she was getting large volumes a week ago. Explained that due to inconsistent milk removal her body has most likely "calibrated" her supply to a lower threshold. Her plan is to feed baby directly at the breast along with pumping and bottle feeding post-discharge. Reviewed pumping schedule, lactogenesis III, strategies to increase supply and anticipatory guidelines.   Objective Infant data: Mother's Current Feeding Choice: Breast Milk and Formula  Infant feeding assessment Scale for Readiness: 2 Scale for Quality: 2  Maternal data: [id number]  Vaginal, Spontaneous Pumping frequency: 2 times/24 hours Pumped volume: 30 mL (30-45 ml) Flange Size: 24 Risk factor for low milk supply:: prematurity, inconsistent pumping as 02/09/2023  WIC Program: Yes WIC Referral Sent?: Yes What county?: [place] Pump:  (No pump at home, she's rooming in and using the pump in baby's room. [place] referral was sent on adsmision.)  Assessment Infant: Feeding Status: Scheduled 8-11-2-5  Maternal: Milk volume: Low  Intervention/Plan Interventions: Interventions: Breast feeding basics reviewed; DEBP; Education  Tools: Pump; Flanges Pump Education: Setup, frequency, and cleaning; Milk Storage  Plan: Encouraged pumping every 3 hours, ideally 8 pumping sessions/24 hours She'll try power pumping in the AM  She'll call for latch assistance when she's ready to take baby to breast   FOB present. All questions and concerns answered, family  to contact [facility] services PRN.  Consult Status: NICU follow-up  NICU Follow-up type: Weekly NICU follow up; Assist with IDF-2 (Mother does not need to pre-pump before breastfeeding)   Michelle Greer 02/09/2023, 5:36 PM

## 2023-02-14 ENCOUNTER — Ambulatory Visit: Payer: Medicaid Other | Admitting: Clinical

## 2023-02-14 DIAGNOSIS — Z91199 Patient's noncompliance with other medical treatment and regimen due to unspecified reason: Secondary | ICD-10-CM

## 2023-02-21 ENCOUNTER — Telehealth (HOSPITAL_COMMUNITY): Payer: Self-pay

## 2023-02-21 NOTE — Telephone Encounter (Signed)
Preadmission testing 

## 2023-03-03 ENCOUNTER — Ambulatory Visit: Payer: Medicaid Other | Admitting: Obstetrics and Gynecology

## 2023-03-07 ENCOUNTER — Ambulatory Visit: Payer: Medicaid Other | Admitting: Advanced Practice Midwife

## 2023-04-28 ENCOUNTER — Ambulatory Visit (INDEPENDENT_AMBULATORY_CARE_PROVIDER_SITE_OTHER): Payer: Medicaid Other | Admitting: *Deleted

## 2023-04-28 VITALS — BP 117/71 | HR 75 | Ht 65.0 in | Wt 168.4 lb

## 2023-04-28 DIAGNOSIS — Z3042 Encounter for surveillance of injectable contraceptive: Secondary | ICD-10-CM | POA: Diagnosis not present

## 2023-04-28 DIAGNOSIS — N939 Abnormal uterine and vaginal bleeding, unspecified: Secondary | ICD-10-CM

## 2023-04-28 MED ORDER — MEDROXYPROGESTERONE ACETATE 150 MG/ML IM SUSP
150.0000 mg | Freq: Once | INTRAMUSCULAR | Status: AC
  Administered 2023-04-28: 150 mg via INTRAMUSCULAR

## 2023-04-28 NOTE — Progress Notes (Signed)
Pt presents for Depo Provera injection. Pt delivered baby on 01/21/23 and received Depo Provera on 3/3. Her next dose was due 5/19-6/2.  She states no intercourse since baby's birth. Pt subsequently had D&C on 3/8 due to abnormal bleeding and retained products of conception. She did not follow up as scheduled for post partum care. Pt now reports that she has not stopped bleeding and is currently changing her pad 2-3 times/Khali Perella which is moderately saturated. Consult with Dr. Crissie Reese who recommends pt may receive Depo Provera injection today. He also advised need for pelvic US. Pt agreed to plan of care. [place] scheduled on 6/10 @ 10:30am - all instructions given. Depo Provera 150 mg IM administered without difficulty. Next dose due 8/22-9/5. Pt will follow up @ scheduled Gyn appt in office on 6/24.

## 2023-05-02 ENCOUNTER — Ambulatory Visit (HOSPITAL_COMMUNITY): Payer: Medicaid Other

## 2023-05-16 ENCOUNTER — Ambulatory Visit: Payer: Medicaid Other | Admitting: Obstetrics and Gynecology

## 2023-05-16 NOTE — Progress Notes (Deleted)
    Post Partum Visit Note  Michelle Greer is a 27 y.o. [phone] female who presents for a postpartum visit. She is {1-10:13787} {time; units:18646} postpartum following a {method of delivery:313099}.  I have fully reviewed the prenatal and intrapartum course. The delivery was at *** gestational weeks.  Anesthesia: {anesthesia types:812}. Postpartum course has been ***. Baby is doing well***. Baby is feeding by {breastmilk/bottle:69}. Bleeding {vag bleed:12292}. Bowel function is {normal:32111}. Bladder function is {normal:32111}. Patient {is/is not:9024} sexually active. Contraception method is {contraceptive method:5051}. Postpartum depression screening: {gen negative/positive:315881}.   The pregnancy intention screening data noted above was reviewed. Potential methods of contraception were discussed. The patient elected to proceed with No data recorded.    Health Maintenance Due  Topic Date Due   COVID-19 Vaccine (1) Never done    {Common ambulatory SmartLinks:19316}  Review of Systems {ros; complete:30496}  Objective:  There were no vitals taken for this visit.   General:  {gen appearance:16600}   Breasts:  {desc; normal/abnormal/not indicated:14647}  Lungs: {lung exam:16931}  Heart:  {heart exam:5510}  Abdomen: {abdomen exam:16834}   Wound {Wound assessment:11097}  GU exam:  {desc; normal/abnormal/not indicated:14647}       Assessment:    There are no diagnoses linked to this encounter.  *** postpartum exam.   Plan:   Essential components of care per ACOG recommendations:  1.  Mood and well being: Patient with {gen negative/positive:315881} depression screening today. Reviewed local resources for support.  - Patient tobacco use? {tobacco use:25506}  - hx of drug use? {yes/no:25505}    2. Infant care and feeding:  -Patient currently breastmilk feeding? {yes/no:25502}  -Social determinants of health (SDOH) reviewed in EPIC. No concerns***The following needs were  identified***  3. Sexuality, contraception and birth spacing - Patient {DOES_DOES AVW:09811} want a pregnancy in the next year.  Desired family size is {NUMBER 1-10:22536} children.  - Reviewed reproductive life planning. Reviewed contraceptive methods based on pt preferences and effectiveness.  Patient desired {Upstream End Methods:24109} today.   - Discussed birth spacing of 18 months  4. Sleep and fatigue -Encouraged family/partner/community support of 4 hrs of uninterrupted sleep to help with mood and fatigue  5. Physical Recovery  - Discussed patients delivery and complications. She describes her labor as {description:25511} - Patient had a {CHL AMB DELIVERY:[phone]}. Patient had a {laceration:25518} laceration. Perineal healing reviewed. Patient expressed understanding - Patient has urinary incontinence? {yes/no:25515} - Patient {ACTION; IS/IS BJY:[id number]} safe to resume physical and sexual activity  6.  Health Maintenance - HM due items addressed {Yes or If no, why not?:20788} - Last pap smear  Diagnosis  Date Value Ref Range Status  11/29/2022   Final   - Negative for intraepithelial lesion or malignancy (NILM)   Pap smear {done:10129} at today's visit.  -Breast Cancer screening indicated? {indicated:25516}  7. Chronic Disease/Pregnancy Condition follow up: {Follow up:25499}  - PCP follow up  Judd Gaudier, CMA [facility], [facility]

## 2023-05-25 ENCOUNTER — Ambulatory Visit: Payer: Medicaid Other | Admitting: Obstetrics and Gynecology

## 2023-07-14 ENCOUNTER — Ambulatory Visit: Payer: Medicaid Other

## 2024-08-28 ENCOUNTER — Other Ambulatory Visit: Payer: Self-pay

## 2024-08-28 ENCOUNTER — Emergency Department (HOSPITAL_COMMUNITY)

## 2024-08-28 ENCOUNTER — Emergency Department (HOSPITAL_COMMUNITY)
Admission: EM | Admit: 2024-08-28 | Discharge: 2024-08-28 | Disposition: A | Discharge status: Home / Self Care | Attending: Emergency Medicine | Admitting: Emergency Medicine

## 2024-08-28 ENCOUNTER — Encounter (HOSPITAL_COMMUNITY): Payer: Self-pay

## 2024-08-28 DIAGNOSIS — G5601 Carpal tunnel syndrome, right upper limb: Secondary | ICD-10-CM | POA: Diagnosis not present

## 2024-08-28 DIAGNOSIS — M549 Dorsalgia, unspecified: Secondary | ICD-10-CM | POA: Diagnosis present

## 2024-08-28 DIAGNOSIS — M545 Low back pain, unspecified: Secondary | ICD-10-CM | POA: Diagnosis not present

## 2024-08-28 LAB — URINALYSIS, ROUTINE W REFLEX MICROSCOPIC
Bilirubin Urine: NEGATIVE
Glucose, UA: NEGATIVE mg/dL
Hgb urine dipstick: NEGATIVE
Ketones, ur: NEGATIVE mg/dL
Leukocytes,Ua: NEGATIVE
Nitrite: NEGATIVE
Protein, ur: NEGATIVE mg/dL
Specific Gravity, Urine: 1.017 (ref 1.005–1.030)
pH: 7 (ref 5.0–8.0)

## 2024-08-28 LAB — PREGNANCY, URINE: Preg Test, Ur: NEGATIVE

## 2024-08-28 MED ORDER — TRAMADOL HCL 50 MG PO TABS: 50.0000 mg | ORAL_TABLET | Freq: Four times a day (QID) | ORAL | 0 refills | Status: AC | PRN

## 2024-08-28 MED ORDER — TRAMADOL HCL 50 MG PO TABS
50.0000 mg | ORAL_TABLET | Freq: Once | ORAL | Status: AC
  Administered 2024-08-28: 50 mg via ORAL
  Filled 2024-08-28: qty 1

## 2024-08-28 NOTE — ED Provider Notes (Signed)
 [facility] DEPARTMENT AT [facility] Provider Note   CSN: [id number] Arrival date & time: 08/28/24  1017     Patient presents with: No chief complaint on file.   Michelle Greer is a 28 y.o. female history of chronic back pain and sciatica here presenting with back pain and right wrist pain.  She states that she recently went back to work at [employer].  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  She states that she has worsening back pain and right wrist pain for several days.  Patient has not followed up with orthopedic doctor or spine doctor   The history is provided by the patient.       Prior to Admission medications   Medication Sig Start Date End Date Taking? Authorizing Provider  acetaminophen  (TYLENOL ) 500 MG tablet Take 2 tablets (1,000 mg total) by mouth every 8 (eight) hours. 01/23/23   Rossi, Lyda, MD  albuterol  (VENTOLIN  HFA) 108 [phone] Base) MCG/ACT inhaler Inhale 2 puffs into the lungs every 6 (six) hours as needed for wheezing or shortness of breath. 09/06/22   Nicholas Bar, MD  benzocaine -Menthol  (DERMOPLAST) 20-0.5 % AERO Apply 1 Application topically as needed for irritation (perineal discomfort). 01/23/23   Rossi, Lyda, MD  ferrous sulfate  325 (65 FE) MG EC tablet Take 1 tablet (325 mg total) by mouth every other day. 12/02/22 05/31/23  Ndulue, Chiagoziem J, MD  ondansetron  (ZOFRAN -ODT) 4 MG disintegrating tablet Take 1 tablet (4 mg total) by mouth every 6 (six) hours as needed for nausea. 01/28/23   Zina Jerilynn LABOR, MD  oxyCODONE  (ROXICODONE ) 5 MG immediate release tablet Take 1 tablet (5 mg total) by mouth every 4 (four) hours as needed for severe pain. 01/28/23   Zina Jerilynn LABOR, MD  sertraline  (ZOLOFT ) 25 MG tablet TAKE 1 TABLET(25 MG) BY MOUTH DAILY FOR 14 DAYS THEN TAKE 2 TABLETS(50 MG) BY MOUTH DAILY 02/02/23   Ndulue, Chiagoziem J, MD    Allergies: Iodine and Tomato    Review of Systems  Musculoskeletal:  Positive for back pain.        Right wrist pain  All other systems reviewed and are negative.   Updated Vital Signs BP 124/71   Pulse 71   Temp 97.7 F (36.5 C)   Resp 12   Ht 5' 6 (1.676 m)   Wt 61.7 kg   SpO2 100%   BMI 21.95 kg/m   Physical Exam Vitals and nursing note reviewed.  Constitutional:      Comments: Slightly uncomfortable  HENT:     Head: Normocephalic.     Nose: Nose normal.     Mouth/Throat:     Mouth: Mucous membranes are moist.  Eyes:     Extraocular Movements: Extraocular movements intact.     Pupils: Pupils are equal, round, and reactive to light.  Cardiovascular:     Rate and Rhythm: Normal rate and regular rhythm.     Pulses: Normal pulses.     Heart sounds: Normal heart sounds.  Pulmonary:     Effort: Pulmonary effort is normal.     Breath sounds: Normal breath sounds.  Abdominal:     General: Abdomen is flat.     Palpations: Abdomen is soft.  Musculoskeletal:     Cervical back: Normal range of motion and neck supple.     Comments: Mild tenderness over the right wrist obvious deformity and normal range of motion.  Patient does have lower lumbar  tenderness.  Patient has no saddle anesthesia and negative straight leg raise bilaterally.  Skin:    General: Skin is warm.  Neurological:     General: No focal deficit present.     Comments: No saddle anesthesia and normal reflexes bilateral knees.  Normal strength bilateral lower extremities  Psychiatric:        Mood and Affect: Mood normal.        Behavior: Behavior normal.     (all labs ordered are listed, but only abnormal results are displayed) Labs Reviewed  URINALYSIS, ROUTINE W REFLEX MICROSCOPIC - Abnormal; Notable for the following components:      Result Value   APPearance HAZY (*)    All other components within normal limits  PREGNANCY, URINE    EKG: None  Radiology: DG Lumbar Spine Complete Result Date: 08/28/2024 CLINICAL DATA:  Back pain EXAM: LUMBAR SPINE - COMPLETE 4+ VIEW COMPARISON:  July 29, 2017 FINDINGS: There is no evidence of lumbar spine fracture. Alignment is normal. No spondylolysis or listhesis. Bowel gas pattern is normal. Intervertebral disc spaces are maintained. IMPRESSION: Negative. Electronically Signed   By: Megan  Zare M.D.   On: 08/28/2024 12:50   DG Wrist Complete Right Result Date: 08/28/2024 CLINICAL DATA:  Right wrist pain after heavy lifting. EXAM: RIGHT WRIST - COMPLETE 3+ VIEW COMPARISON:  None Available. FINDINGS: There is no evidence of fracture or dislocation. There is no evidence of arthropathy or other focal bone abnormality. Soft tissues are unremarkable. IMPRESSION: Negative. Electronically Signed   By: Lynwood Landy Raddle M.D.   On: 08/28/2024 12:49     Procedures   Medications Ordered in the ED  traMADol (ULTRAM) tablet 50 mg (has no administration in time range)                                    Medical Decision Making Michelle Greer is a 28 y.o. female here presenting with back pain and wrist pain.  Patient does heavy lifting at [employer].  Patient is neurovascularly intact.  Right wrist x-ray showed no fracture and a lumbar x-ray was unremarkable.  Will discharge home with tramadol as needed and refer to spine surgery for follow-up.  Will also give right wrist splint and have her follow-up with hand surgery   Problems Addressed: Acute low back pain without sciatica, unspecified back pain laterality: acute illness or injury Carpal tunnel syndrome of right wrist: acute illness or injury  Risk Prescription drug management.    Final diagnoses:  None    ED Discharge Orders     None          Patt Alm Macho, MD 08/28/24 1446

## 2024-08-28 NOTE — ED Triage Notes (Signed)
 Pt c/o back and upper tailbone pain that has been recurrent since she had her children. Pt states pain came back yesterday and is worse with movement. Pt also has carpal tunnel in wrist and c/o pain. 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

## 2024-08-28 NOTE — Discharge Instructions (Signed)
 Your x-ray did not show any fracture  Please wear the wrist brace for comfort.  Please call Dr. Deanna office for follow up  For your back pain, please call [facility] for follow-up  Return to ER if you have worsening pain or trouble walking

## 2024-08-28 NOTE — ED Provider Triage Note (Signed)
 Emergency Medicine Provider Triage Evaluation Note  Michelle Greer , a 28 y.o. female  was evaluated in triage.  Pt complains of worsening low back and tailbone pain and right wrist pain.  Review of Systems  Positive: Pain low back Negative: Urinary symptoms  Physical Exam  BP 125/76 (BP Location: Left Arm)   Pulse 88   Temp 98.2 F (36.8 C)   Resp 16   Ht 5' 6 (1.676 m)   Wt 61.7 kg   SpO2 100%   BMI 21.95 kg/m  Gen:   Awake, no distress   Resp:  Normal effort  MSK:   Moves extremities without difficulty  Other:    Medical Decision Making  Medically screening exam initiated at 10:31 AM.  Appropriate orders placed.  Michelle Greer was informed that the remainder of the evaluation will be completed by another provider, this initial triage assessment does not replace that evaluation, and the importance of remaining in the ED until their evaluation is complete.     Michelle Ozell BROCKS, MD 08/28/24 1032

## 2024-11-24 ENCOUNTER — Emergency Department (HOSPITAL_COMMUNITY): Payer: Self-pay

## 2024-11-24 ENCOUNTER — Other Ambulatory Visit: Payer: Self-pay

## 2024-11-24 ENCOUNTER — Emergency Department (HOSPITAL_COMMUNITY)
Admission: EM | Admit: 2024-11-24 | Discharge: 2024-11-24 | Disposition: A | Discharge status: Home / Self Care | Payer: Self-pay | Attending: Emergency Medicine | Admitting: Emergency Medicine

## 2024-11-24 DIAGNOSIS — J101 Influenza due to other identified influenza virus with other respiratory manifestations: Secondary | ICD-10-CM | POA: Insufficient documentation

## 2024-11-24 LAB — URINALYSIS, ROUTINE W REFLEX MICROSCOPIC
Bacteria, UA: NONE SEEN
Bilirubin Urine: NEGATIVE
Glucose, UA: NEGATIVE mg/dL
Ketones, ur: NEGATIVE mg/dL
Leukocytes,Ua: NEGATIVE
Nitrite: NEGATIVE
Protein, ur: NEGATIVE mg/dL
Specific Gravity, Urine: 1.008 (ref 1.005–1.030)
pH: 8 (ref 5.0–8.0)

## 2024-11-24 LAB — CBC
HCT: 37.5 % (ref 36.0–46.0)
Hemoglobin: 12.2 g/dL (ref 12.0–15.0)
MCH: 29.3 pg (ref 26.0–34.0)
MCHC: 32.5 g/dL (ref 30.0–36.0)
MCV: 89.9 fL (ref 80.0–100.0)
Platelets: 291 K/uL (ref 150–400)
RBC: 4.17 MIL/uL (ref 3.87–5.11)
RDW: 15.9 % — ABNORMAL HIGH (ref 11.5–15.5)
WBC: 4.4 K/uL (ref 4.0–10.5)
nRBC: 0 % (ref 0.0–0.2)

## 2024-11-24 LAB — COMPREHENSIVE METABOLIC PANEL WITH GFR
ALT: 19 U/L (ref 0–44)
AST: 22 U/L (ref 15–41)
Albumin: 3.9 g/dL (ref 3.5–5.0)
Alkaline Phosphatase: 56 U/L (ref 38–126)
Anion gap: 7 (ref 5–15)
BUN: 8 mg/dL (ref 6–20)
CO2: 27 mmol/L (ref 22–32)
Calcium: 8.8 mg/dL — ABNORMAL LOW (ref 8.9–10.3)
Chloride: 104 mmol/L (ref 98–111)
Creatinine, Ser: 0.62 mg/dL (ref 0.44–1.00)
GFR, Estimated: >60 mL/min
Glucose, Bld: 93 mg/dL (ref 70–99)
Potassium: 4.5 mmol/L (ref 3.5–5.1)
Sodium: 138 mmol/L (ref 135–145)
Total Bilirubin: <0.2 mg/dL (ref 0.0–1.2)
Total Protein: 6.8 g/dL (ref 6.5–8.1)

## 2024-11-24 LAB — RESP PANEL BY RT-PCR (RSV, FLU A&B, COVID)  RVPGX2
Influenza A by PCR: POSITIVE — AB
Influenza B by PCR: NEGATIVE
Resp Syncytial Virus by PCR: NEGATIVE
SARS Coronavirus 2 by RT PCR: NEGATIVE

## 2024-11-24 LAB — GROUP A STREP BY PCR: Group A Strep by PCR: NOT DETECTED

## 2024-11-24 LAB — LIPASE, BLOOD: Lipase: 46 U/L (ref 11–51)

## 2024-11-24 LAB — HCG, SERUM, QUALITATIVE: Preg, Serum: NEGATIVE

## 2024-11-24 MED ORDER — ACETAMINOPHEN 500 MG PO TABS
1000.0000 mg | ORAL_TABLET | Freq: Once | ORAL | Status: AC
  Administered 2024-11-24: 1000 mg via ORAL
  Filled 2024-11-24: qty 2

## 2024-11-24 MED ORDER — IBUPROFEN 800 MG PO TABS
800.0000 mg | ORAL_TABLET | Freq: Once | ORAL | Status: AC
  Administered 2024-11-24: 800 mg via ORAL
  Filled 2024-11-24: qty 1

## 2024-11-24 MED ORDER — IOHEXOL 350 MG/ML SOLN
75.0000 mL | Freq: Once | INTRAVENOUS | Status: AC | PRN
  Administered 2024-11-24: 75 mL via INTRAVENOUS

## 2024-11-24 MED ORDER — ONDANSETRON 4 MG PO TBDP
4.0000 mg | ORAL_TABLET | Freq: Once | ORAL | Status: AC | PRN
  Administered 2024-11-24: 4 mg via ORAL
  Filled 2024-11-24: qty 1

## 2024-11-24 MED ORDER — ACETAMINOPHEN 500 MG PO TABS: 500.0000 mg | ORAL_TABLET | Freq: Four times a day (QID) | ORAL | 0 refills | Status: AC | PRN

## 2024-11-24 MED ORDER — ONDANSETRON HCL 4 MG/2ML IJ SOLN
4.0000 mg | Freq: Once | INTRAMUSCULAR | Status: AC
  Administered 2024-11-24: 4 mg via INTRAVENOUS
  Filled 2024-11-24: qty 2

## 2024-11-24 MED ORDER — ONDANSETRON HCL 4 MG PO TABS: 4.0000 mg | ORAL_TABLET | Freq: Three times a day (TID) | ORAL | 0 refills | Status: AC | PRN

## 2024-11-24 NOTE — ED Provider Notes (Signed)
 " [facility] Provider Note   CSN: [id number] Arrival date & time: 11/24/24  9084     Patient presents with: Abdominal Pain, Emesis, and Sore Throat (Diff breathing)   Michelle Greer is a 29 y.o. female.   The history is provided by the patient and medical records. No language interpreter was used.  Abdominal Pain Associated symptoms: vomiting   Emesis Associated symptoms: abdominal pain   Sore Throat Associated symptoms include abdominal pain.     29 year old female presenting with complaint of abdominal pain and flulike symptoms.  For the past 5 days patient has had fever, chills, body aches, headache, sore throat, congestion, cough, having shortness of breath, as well as having abdominal pain.  States abdominal pain is a recurrent pain from her lower abdomen and radiates towards the chest which has been ongoing since March of last year after she had complication with retained products of conception.  Patient admits that she has been exposed to several sick contacts at her work and her children being sick.  She has tried over-the-counter medication with some relief.  She did felt nauseous earlier today which improved with Zofran  that was given in the ER.  Her chills and sweats has since improved.  No urinary complaint.   Prior to Admission medications  Medication Sig Start Date End Date Taking? Authorizing Provider  acetaminophen  (TYLENOL ) 500 MG tablet Take 2 tablets (1,000 mg total) by mouth every 8 (eight) hours. 01/23/23   Rossi, Lyda, MD  albuterol  (VENTOLIN  HFA) 108 [phone] Base) MCG/ACT inhaler Inhale 2 puffs into the lungs every 6 (six) hours as needed for wheezing or shortness of breath. 09/06/22   Nicholas Bar, MD  benzocaine -Menthol  (DERMOPLAST) 20-0.5 % AERO Apply 1 Application topically as needed for irritation (perineal discomfort). 01/23/23   Rossi, Lyda, MD  ferrous sulfate  325 (65 FE) MG EC tablet Take 1 tablet (325 mg total)  by mouth every other day. 12/02/22 05/31/23  Ndulue, Chiagoziem J, MD  ondansetron  (ZOFRAN -ODT) 4 MG disintegrating tablet Take 1 tablet (4 mg total) by mouth every 6 (six) hours as needed for nausea. 01/28/23   Zina Jerilynn LABOR, MD  oxyCODONE  (ROXICODONE ) 5 MG immediate release tablet Take 1 tablet (5 mg total) by mouth every 4 (four) hours as needed for severe pain. 01/28/23   Zina Jerilynn LABOR, MD  sertraline  (ZOLOFT ) 25 MG tablet TAKE 1 TABLET(25 MG) BY MOUTH DAILY FOR 14 DAYS THEN TAKE 2 TABLETS(50 MG) BY MOUTH DAILY 02/02/23   Ndulue, Chiagoziem J, MD  traMADol  (ULTRAM ) 50 MG tablet Take 1 tablet (50 mg total) by mouth every 6 (six) hours as needed. 08/28/24   Patt Alm Macho, MD    Allergies: Povidone-iodine and Tomato    Review of Systems  Gastrointestinal:  Positive for abdominal pain and vomiting.  All other systems reviewed and are negative.   Updated Vital Signs BP 135/85 (BP Location: Right Arm)   Pulse 79   Temp 97.6 F (36.4 C) (Oral)   Resp 18   Ht 5' 7 (1.702 m)   Wt 62 kg   SpO2 100%   BMI 21.41 kg/m   Physical Exam Vitals and nursing note reviewed.  Constitutional:      General: She is not in acute distress.    Appearance: She is well-developed.     Comments: Patient is resting comfortably appears to be in no acute discomfort.  HENT:     Head: Atraumatic.  Mouth/Throat:     Mouth: Mucous membranes are moist.  Eyes:     Conjunctiva/sclera: Conjunctivae normal.  Cardiovascular:     Rate and Rhythm: Normal rate and regular rhythm.     Pulses: Normal pulses.     Heart sounds: Normal heart sounds.  Pulmonary:     Effort: Pulmonary effort is normal.  Abdominal:     Palpations: Abdomen is soft.     Tenderness: There is no abdominal tenderness.  Musculoskeletal:     Cervical back: Normal range of motion and neck supple. No rigidity.  Skin:    General: Skin is warm.     Findings: No rash.  Neurological:     Mental Status: She is alert.  Psychiatric:         Mood and Affect: Mood normal.     (all labs ordered are listed, but only abnormal results are displayed) Labs Reviewed  RESP PANEL BY RT-PCR (RSV, FLU A&B, COVID)  RVPGX2 - Abnormal; Notable for the following components:      Result Value   Influenza A by PCR POSITIVE (*)    All other components within normal limits  COMPREHENSIVE METABOLIC PANEL WITH GFR - Abnormal; Notable for the following components:   Calcium  8.8 (*)    All other components within normal limits  CBC - Abnormal; Notable for the following components:   RDW 15.9 (*)    All other components within normal limits  URINALYSIS, ROUTINE W REFLEX MICROSCOPIC - Abnormal; Notable for the following components:   Color, Urine STRAW (*)    Hgb urine dipstick SMALL (*)    All other components within normal limits  GROUP A STREP BY PCR  LIPASE, BLOOD  HCG, SERUM, QUALITATIVE    EKG: None  Radiology: CT ABDOMEN PELVIS W CONTRAST Result Date: 11/24/2024 CLINICAL DATA:  Acute generalized abdominal pain EXAM: CT ABDOMEN AND PELVIS WITH CONTRAST TECHNIQUE: Multidetector CT imaging of the abdomen and pelvis was performed using the standard protocol following bolus administration of intravenous contrast. RADIATION DOSE REDUCTION: This exam was performed according to the departmental dose-optimization program which includes automated exposure control, adjustment of the mA and/or kV according to patient size and/or use of iterative reconstruction technique. CONTRAST:  75mL OMNIPAQUE  IOHEXOL  350 MG/ML SOLN COMPARISON:  None Available. FINDINGS: Lower chest: No acute abnormality. Hepatobiliary: No focal liver abnormality is seen. No gallstones, gallbladder wall thickening, or biliary dilatation. Pancreas: Unremarkable. No pancreatic ductal dilatation or surrounding inflammatory changes. Spleen: Normal in size without focal abnormality. Adrenals/Urinary Tract: Adrenal glands are unremarkable. Kidneys are normal, without renal calculi, focal  lesion, or hydronephrosis. Bladder is unremarkable. Stomach/Bowel: Stomach is within normal limits. Appendix appears normal. No evidence of bowel wall thickening, distention, or inflammatory changes. Vascular/Lymphatic: No significant vascular findings are present. No enlarged abdominal or pelvic lymph nodes. Reproductive: Uterus and bilateral adnexa are unremarkable. Other: No abdominal wall hernia or abnormality. No abdominopelvic ascites. Musculoskeletal: No acute or significant osseous findings. IMPRESSION: No definite abnormality seen in the abdomen or pelvis. Electronically Signed   By: Lynwood Landy Raddle M.D.   On: 11/24/2024 14:30     Procedures   Medications Ordered in the ED  ondansetron  (ZOFRAN -ODT) disintegrating tablet 4 mg (4 mg Oral Given 11/24/24 0956)  acetaminophen  (TYLENOL ) tablet 1,000 mg (1,000 mg Oral Given 11/24/24 1327)  iohexol  (OMNIPAQUE ) 350 MG/ML injection 75 mL (75 mLs Intravenous Contrast Given 11/24/24 1415)  Medical Decision Making Amount and/or Complexity of Data Reviewed Labs: ordered.  Risk Prescription drug management.   BP 135/85 (BP Location: Right Arm)   Pulse 79   Temp 97.6 F (36.4 C) (Oral)   Resp 18   Ht 5' 7 (1.702 m)   Wt 62 kg   SpO2 100%   BMI 21.41 kg/m   63:49 PM  29 year old female presenting with complaint of abdominal pain and flulike symptoms.  For the past 5 days patient has had fever, chills, body aches, headache, sore throat, congestion, cough, having shortness of breath, as well as having abdominal pain.  States abdominal pain is a recurrent pain from her lower abdomen and radiates towards the chest which has been ongoing since March of last year after she had complication with retained products of conception.  Patient admits that she has been exposed to several sick contacts at her work and her children being sick.  She has tried over-the-counter medication with some relief.  She did felt nauseous  earlier today which improved with Zofran  that was given in the ER.  Her chills and sweats has since improved.  No urinary complaint.   Exam overall reassuring.  Abdomen is soft nontender.  Ear nose and throat exam unremarkable.  Her lungs are clear.  -Labs ordered, independently viewed and interpreted by me.  Respiratory panel show positive influenza A.  The rest of her labs remarkable for reassuring labs -The patient was maintained on a cardiac monitor.  I personally viewed and interpreted the cardiac monitored which showed an underlying rhythm of: Sinus rhythm -Imaging independently viewed and interpreted by me and I agree with radiologist's interpretation.  Result remarkable for abdominal pelvis CTA without any concerning finding -This patient presents to the ED for concern of pain, this involves an extensive number of treatment options, and is a complaint that carries with it a high risk of complications and morbidity.  The differential diagnosis includes COVID, flu, RSV, strep, viral illness, chronic abdominal pain, appendicitis, colitis, diverticulitis, pancreatitis, cholecystitis, ectopic pregnancy, TOA -Co morbidities that complicate the patient evaluation includes postpartum hypertension, bronchitis, anemia -Treatment includes IV fluid, Tylenol , Zofran  -Reevaluation of the patient after these medicines showed that the patient improved -PCP office notes or outside notes reviewed -Escalation to admission/observation considered: patients feels much better, is comfortable with discharge, and will follow up with PCP -Prescription medication considered, patient comfortable with tylenol , zofran  -Social Determinant of Health considered which includes depression      Final diagnoses:  Influenza A H1N1 infection    ED Discharge Orders          Ordered    ondansetron  (ZOFRAN ) 4 MG tablet  Every 8 hours PRN        11/24/24 1602    acetaminophen  (TYLENOL ) 500 MG tablet  Every 6 hours PRN         11/24/24 1602               Nivia Colon, PA-C 11/24/24 1603    Francesca Elsie CROME, MD 11/24/24 1648  "

## 2024-11-24 NOTE — ED Provider Triage Note (Signed)
 Emergency Medicine Provider Triage Evaluation Note  Michelle Greer , a 29 y.o. female  was evaluated in triage.  Pt complains of a cough, body aches, chills, sore throat, that started 5 days ago.  She reports ongoing abdominal pain that has been present since an OB surgery she had in March 2025, unsure what the surgery was for.  Denies any change in abdominal pain.  Review of Systems  Positive: As above Negative: As above  Physical Exam  BP 135/85 (BP Location: Right Arm)   Pulse 79   Temp 97.6 F (36.4 C) (Oral)   Resp 18   Ht 5' 7 (1.702 m)   Wt 62 kg   SpO2 100%   BMI 21.41 kg/m  Gen:   Awake, no distress   Resp:  Normal effort  MSK:   Moves extremities without difficulty   Medical Decision Making  Medically screening exam initiated at 12:54 PM.  Appropriate orders placed.  Michelle Greer was informed that the remainder of the evaluation will be completed by another provider, this initial triage assessment does not replace that evaluation, and the importance of remaining in the ED until their evaluation is complete.     Michelle Palma, PA-C 11/24/24 1254

## 2024-11-24 NOTE — Discharge Instructions (Addendum)
 Here for your symptoms.  You have tested positive for influenza A. Please take Zofran  as needed for nausea.  Alternate between Tylenol  and ibuprofen  as needed for aches and pains or fever.  Stay hydrated drink plenty of fluid and get plenty of rest.

## 2024-11-24 NOTE — ED Triage Notes (Addendum)
 Pt arrived POV d/t abd pain, pt states 1 year had complications after birth had surgery a couple days because things were left inside pt states felt same way feeling now, and is currently on menstrual that has been bad since after birth. Actively vomiting c/o lower back pain and sharp  abd pain shooting up to left side chest making it hard to breathe , throat hurts when swallow. Pt states had a fever Tuesday chills/sweats Rates pain 9/10
